# Patient Record
Sex: Male | Born: 1950 | Race: White | Hispanic: No | Marital: Married | Smoking: Former smoker
Health system: Southern US, Community
[De-identification: ages and names within clinical notes are randomized; demographics above are authoritative.]

## PROBLEM LIST (undated history)

## (undated) DIAGNOSIS — R0989 Other specified symptoms and signs involving the circulatory and respiratory systems: Secondary | ICD-10-CM

## (undated) DIAGNOSIS — E0590 Thyrotoxicosis, unspecified without thyrotoxic crisis or storm: Secondary | ICD-10-CM

## (undated) DIAGNOSIS — I10 Essential (primary) hypertension: Secondary | ICD-10-CM

## (undated) DIAGNOSIS — R011 Cardiac murmur, unspecified: Secondary | ICD-10-CM

## (undated) DIAGNOSIS — E669 Obesity, unspecified: Secondary | ICD-10-CM

## (undated) DIAGNOSIS — F419 Anxiety disorder, unspecified: Secondary | ICD-10-CM

## (undated) DIAGNOSIS — E785 Hyperlipidemia, unspecified: Secondary | ICD-10-CM

## (undated) DIAGNOSIS — G4733 Obstructive sleep apnea (adult) (pediatric): Secondary | ICD-10-CM

## (undated) DIAGNOSIS — Z9989 Dependence on other enabling machines and devices: Secondary | ICD-10-CM

## (undated) DIAGNOSIS — I499 Cardiac arrhythmia, unspecified: Secondary | ICD-10-CM

## (undated) DIAGNOSIS — H93239 Hyperacusis, unspecified ear: Secondary | ICD-10-CM

## (undated) DIAGNOSIS — M1712 Unilateral primary osteoarthritis, left knee: Secondary | ICD-10-CM

## (undated) DIAGNOSIS — Z96652 Presence of left artificial knee joint: Secondary | ICD-10-CM

## (undated) DIAGNOSIS — I517 Cardiomegaly: Secondary | ICD-10-CM

## (undated) DIAGNOSIS — E039 Hypothyroidism, unspecified: Secondary | ICD-10-CM

## (undated) DIAGNOSIS — I4819 Other persistent atrial fibrillation: Secondary | ICD-10-CM

## (undated) DIAGNOSIS — E079 Disorder of thyroid, unspecified: Secondary | ICD-10-CM

## (undated) DIAGNOSIS — I4891 Unspecified atrial fibrillation: Secondary | ICD-10-CM

## (undated) HISTORY — DX: Hyperacusis, unspecified ear: H93.239

## (undated) HISTORY — DX: Cardiac murmur, unspecified: R01.1

## (undated) HISTORY — DX: Hyperlipidemia, unspecified: E78.5

## (undated) HISTORY — PX: KNEE SURGERY: SHX244

## (undated) HISTORY — DX: Thyrotoxicosis, unspecified without thyrotoxic crisis or storm: E05.90

## (undated) HISTORY — PX: EYE SURGERY: SHX253

## (undated) HISTORY — DX: Obstructive sleep apnea (adult) (pediatric): G47.33

## (undated) HISTORY — DX: Cardiomegaly: I51.7

## (undated) HISTORY — DX: Dependence on other enabling machines and devices: Z99.89

## (undated) HISTORY — PX: OTHER SURGICAL HISTORY: SHX169

## (undated) HISTORY — PX: APPENDECTOMY: SHX54

---
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
## 1978-02-09 HISTORY — PX: KNEE SURGERY: SHX244

## 2002-01-30 ENCOUNTER — Encounter (INDEPENDENT_AMBULATORY_CARE_PROVIDER_SITE_OTHER): Payer: Self-pay

## 2002-01-30 ENCOUNTER — Ambulatory Visit (HOSPITAL_COMMUNITY): Admission: RE | Admit: 2002-01-30 | Discharge: 2002-01-30 | Payer: Self-pay | Admitting: *Deleted

## 2002-08-18 ENCOUNTER — Encounter: Payer: Self-pay | Admitting: Emergency Medicine

## 2002-08-19 ENCOUNTER — Encounter (INDEPENDENT_AMBULATORY_CARE_PROVIDER_SITE_OTHER): Payer: Self-pay | Admitting: Specialist

## 2002-08-19 ENCOUNTER — Inpatient Hospital Stay (HOSPITAL_COMMUNITY): Admission: EM | Admit: 2002-08-19 | Discharge: 2002-08-20 | Payer: Self-pay | Admitting: Emergency Medicine

## 2005-10-27 ENCOUNTER — Encounter (INDEPENDENT_AMBULATORY_CARE_PROVIDER_SITE_OTHER): Payer: Self-pay | Admitting: *Deleted

## 2005-10-27 ENCOUNTER — Ambulatory Visit: Admission: RE | Admit: 2005-10-27 | Discharge: 2005-10-27 | Payer: Self-pay | Admitting: *Deleted

## 2005-11-16 ENCOUNTER — Encounter: Admission: RE | Admit: 2005-11-16 | Discharge: 2005-11-16 | Payer: Self-pay | Admitting: Endocrinology

## 2006-06-10 ENCOUNTER — Ambulatory Visit (HOSPITAL_BASED_OUTPATIENT_CLINIC_OR_DEPARTMENT_OTHER): Admission: RE | Admit: 2006-06-10 | Discharge: 2006-06-10 | Payer: Self-pay | Admitting: *Deleted

## 2006-06-13 ENCOUNTER — Ambulatory Visit: Payer: Self-pay | Admitting: Internal Medicine

## 2009-02-06 ENCOUNTER — Encounter: Admission: RE | Admit: 2009-02-06 | Discharge: 2009-02-06 | Payer: Self-pay | Admitting: Internal Medicine

## 2010-06-27 NOTE — H&P (Signed)
[name], [name]             ACCOUNT NO.:  [id number]   MEDICAL RECORD NO.:  [id number]          PATIENT TYPE:  AMB   LOCATION:  DFTL                         FACILITY:  [facility]   PHYSICIAN:  Elmore Guise., M.D.DATE OF BIRTH:  04-21-1950   DATE OF ADMISSION:  10/27/2005  DATE OF DISCHARGE:  10/27/2005                                HISTORY & PHYSICAL   INDICATION FOR ADMISSION:  Newly diagnosed atrial fibrillation for TEE/DC  cardioversion.   HISTORY OF PRESENT ILLNESS:  The patient is a very pleasant 60 year old  white male, past medical history of hypertension who presented with  fluttering in his chest for the last week.  He was found to be in atrial  fibrillation with rapid ventricular response and was started on Cardizem and  Coumadin.  The patient actually states that for the last 6 months he has  been having off and on palpitations.  Normally they would last 30 minutes to  an hour at a time and then resolve spontaneously.  Over the last month,  however, his palpitations have been coming on more frequently, now lasting  up to half a day at a time.  They would never be continuous.  Over the last  2 weeks, however, he has been out of rhythm for longer than he has been in  rhythm.  He reports mild dyspnea and decreased exertional tolerance.  Denies  any orthopnea or PND, no recent fever, chills, nausea, vomiting or diarrhea.  He denies any weight loss.  He did lose approximately 40 pounds 2 years ago,  but this was by doing portion control and exercise.  He has no significant  lower extremity edema.  He has had cardiac workup including an exercise  stress Cardiolite done at [facility] on February 24, 2005.  During that visit, he was in sinus rhythm.  He exercised for 7  minutes achieving a peak heart rate of 145 beats per minute corresponding to  88% of age predicted maximum.  He stopped secondary to shortness of breath  but no had no chest  pain.  He had no evidence of inducible ischemia, and he  had normal LV systolic function with an EF of 60%.  He has had a past  echocardiogram which he thinks was done more than 10 years ago.  He thought  he had some leaking in one of his valves.   REVIEW OF SYSTEMS:  Otherwise negative.   CURRENT MEDICATIONS:  1. Diltiazem 180 mg daily.  2. Benicar 20 mg daily.  3. Coumadin 5 mg daily.   ALLERGIES:  PENICILLIN.   FAMILY HISTORY:  Positive for heart disease with his father who had bypass  surgery as well as valve replacement.   SOCIAL HISTORY:  He is widowed, works in Clinical research associate, used to  walk between 2-3 miles at night, however, with his palpitation, has been  unable to do this.  No tobacco intake, does drink decaffeinated tea and 1  Pepsi per day.   PAST SURGICAL HISTORY:  1. Appendectomy in 2005.  2. He had right knee  surgery back in 1976.   PHYSICAL EXAMINATION:  VITAL SIGNS:  His weight is 223 pounds, blood  pressure 130/80.  His heart rate is 138 and irregular.  GENERAL:  He is a very pleasant middle-aged white male, alert and oriented  x4, no acute distress.  He has no JVD and no bruits.  HEENT:  Appear normal.  LUNGS:  Clear.  HEART:  Irregularly irregular with a normal S1, soft flow murmur noted.  ABDOMEN:  Soft, nontender, nondistended.  No rebound or guarding.  EXTREMITIES:  Warm with 2+ pulses and no significant edema.   His EKG did show atrial fibrillation rate of 138 per minute normal axis,  normal intervals with no significant ST or T-wave changes.  His QT interval  was 290 msec, QTC of 439 msec.  His EKG during sinus rhythm also had a  normal QT.  His blood work shows white count of 6.1, hemoglobin of 14.2,  platelet count 263, BUN and creatinine of 17 and 1.0, potassium level 4.6.  LFTs are normal.  He had a total cholesterol of 140, triglycerides of 97,  HDL of 39, LDL of 81.  His TSH is pending at time of dictation.   IMPRESSION:  1. Newly  diagnosed atrial fibrillation.  2. History of hypertension  3. Decreased exertional tolerance and dyspnea.   PLAN:  1. The patient does continue to have atrial fibrillation with rapid      ventricular response.  He is the primary caregiver to his 3 children      since his wife passed away from cancer 2 years ago.  We plan to start      propafenone 150 mg 3 times daily, increase his Diltiazem up to 180 mg      twice daily.  He will continue his Coumadin. He is symptomatic with      decreased exertional tolerance and dyspnea.  I discussed TEE with DC      cardioversion to be scheduled as soon as possible.  We will give his      propafenone over the weekend to see if this does not restore normal      sinus rhythm.  If not, his procedure will be scheduled for Tuesday      September 18, at 1 p.m.  I did discuss risks and benefits of this      procedure at length.  He will have a PT/INR done on arrival should his      INR be subtherapeutic.  I will give 1 shot of Lovenox at that time.  I      plan to see him back 1-2 weeks after his cardioversion.  Should he go      back to normal rhythm, he is to notify the office so we can get an EKG      and instruct him further on his medications.      Elmore Guise., M.D.  Electronically Signed     TWK/MEDQ  D:  10/22/2005  T:  10/22/2005  Job:  [id number]   cc:   Soyla Murphy. Renne Crigler, M.D.

## 2010-06-27 NOTE — Op Note (Signed)
[name], [name]                       ACCOUNT NO.:  [id number]   MEDICAL RECORD NO.:  [id number]                   PATIENT TYPE:  EMS   LOCATION:  ED                                   FACILITY:  [facility]   PHYSICIAN:  Lorne Skeens. Hoxworth, M.D.          DATE OF BIRTH:  11/28/1950   DATE OF PROCEDURE:  08/19/2002  DATE OF DISCHARGE:                                 OPERATIVE REPORT   PREOPERATIVE DIAGNOSIS:  Acute appendicitis.   POSTOPERATIVE DIAGNOSIS:  Acute appendicitis with perforation.   PROCEDURE:  Laparoscopic appendectomy.   SURGEON:  Lorne Skeens. Hoxworth, M.D.   ANESTHESIA:  General.   BRIEF HISTORY:  Mr. Sadlon is a 60 year old white male who presents with  a 12-18 hour history of progressively worsening right lower quadrant  abdominal pain, decreased appetite and fever. He has marked tenderness and  rebound in the right lower quadrant. Laparoscopic appendectomy for apparent  acute appendicitis has been recommended and accepted. The nature of the  procedure, indications, risks of bleeding and infection were discussed and  understood. He is now brought to the operating room for this procedure.   DESCRIPTION OF PROCEDURE:  The patient was brought to the operating room,  placed in the supine position on the operating table and general  endotracheal anesthesia was induced. He had received broad spectrum  antibiotics. A Foley catheter was placed. The abdomen was sterilely prepped  and draped. PAS were in place. Local anesthesia was used to infiltrate the  trocar sites prior to the incisions. A 1 cm incision was made at the  umbilicus and dissection carried down the midline fascia which was sharply  incised for 1 cm and the peritoneum entered under direct vision. Through a  mattress suture of #0 Vicryl, the Hasson trocar was placed and  pneumoperitoneum established. Under direct vision, a 5 mm trocar was placed  in the right upper quadrant and a 12 mm trocar in  the left lower quadrant.  The cecum and appendix were exposed and the appendix was acutely inflamed  with a small perforation at its midpoint. There was not significant diffuse  peritonitis. The appendix was grasped and was free except for the  mesoappendix. This was sequentially divided with the harmonic scalpel up to  the base of the appendix which was completely freed and was not inflamed at  this point. The appendix was divided just below its base at the tip of the  cecum with a single firing of the endoGIA 3.5 mm stapler. The appendix was  removed with an EndoCatch bag. The abdomen was then thoroughly irrigated  with saline until clear and complete hemostasis assured. Trocars removed  under direct vision, all CO2 evacuated from the peritoneal cavity. The  mattress suture was secured at the umbilicus. Skin incisions were closed  with interrupted subcuticular 4-0 Monocryl and Steri-Strips. Sponge, needle  and instrument counts were correct. Dry sterile dressings were applied and  the patient taken to the recovery room in good condition.                                               Lorne Skeens. Hoxworth, M.D.    Tory Emerald  D:  08/19/2002  T:  08/19/2002  Job:  [id number]

## 2010-06-27 NOTE — Op Note (Signed)
   [name], [name]                       ACCOUNT NO.:  [id number]   MEDICAL RECORD NO.:  [id number]                   PATIENT TYPE:  AMB   LOCATION:  ENDO                                 FACILITY:  [facility]   PHYSICIAN:  Georgiana Spinner, M.D.                 DATE OF BIRTH:  12-06-1950   DATE OF PROCEDURE:  01/30/2002  DATE OF DISCHARGE:                                 OPERATIVE REPORT   PROCEDURE:  Upper endoscopy.   INDICATIONS:  GERD.   ANESTHESIA:  Demerol 50, Versed 8 mg.   DESCRIPTION OF PROCEDURE:  With patient mildly sedated in the left lateral  decubitus position, the Olympus videoscopic endoscope was inserted in the  mouth and passed under direct vision through the esophagus, which appeared  normal until we reached distal esophagus, and there was a short segment of  Barrett's, seen and photographed.  We tried to biopsy this, but he refluxed  material from the stomach, so it made it difficult to see this clearly, but  biopsies were taken in the general area.  We advanced into the stomach.  Fundus, body, antrum, duodenal bulb, and second portion of duodenum all  appeared normal.  From this point, the endoscope was slowly withdrawn,  taking circumferential views of the entire duodenal mucosa until the  endoscope pulled back into the stomach, placed in retroflexion to view the  stomach from below.  The endoscope was then straightened and withdrawn,  taking circumferential views of the remaining gastric and esophageal mucosa.  The patient's vital signs and pulse oximeter remained stable.  The patient  tolerated the procedure well without apparent complications.   FINDINGS:  Short segment of Barrett's esophagus above an incomplete wrap of  the gastroesophageal junction around the endoscope with free reflux.   PLAN:  1. Await biopsy report.  2. The patient will call me for results and follow up with me as an     outpatient.  3. Proceed to colonoscopy as planned.                                             Georgiana Spinner, M.D.    GMO/MEDQ  D:  01/30/2002  T:  01/30/2002  Job:  [id number]

## 2010-06-27 NOTE — Op Note (Signed)
   [name], [name]                       ACCOUNT NO.:  [id number]   MEDICAL RECORD NO.:  [id number]                   PATIENT TYPE:  AMB   LOCATION:  ENDO                                 FACILITY:  [facility]   PHYSICIAN:  Georgiana Spinner, M.D.                 DATE OF BIRTH:  Feb 24, 1950   DATE OF PROCEDURE:  DATE OF DISCHARGE:                                 OPERATIVE REPORT   PROCEDURE:  Colonoscopy.   INDICATIONS FOR PROCEDURE:  Rectal bleeding, colon cancer screening.   ANESTHESIA:  Demerol 50 mg.   DESCRIPTION OF PROCEDURE:  With the patient mildly sedated in the left  lateral decubitus position, the Olympus videoscopic colonoscope was inserted  in the rectum and passed under direct vision to the cecum identified by the  ileocecal valve and appendiceal orifice both of which were photographed.  From this point, the colonoscope was slowly withdrawn taking circumferential  views of the entire colonic mucosa stopping in the rectum which appeared  normal in direct and retroflexed view. The endoscope was straightened and  withdrawn. The patient's vital signs and pulse oximeter remained stable. The  patient tolerated the procedure well without apparent complications.   FINDINGS:  Negative examination.   PLAN:  See endoscopy note for further details.                                               Georgiana Spinner, M.D.    GMO/MEDQ  D:  01/30/2002  T:  01/30/2002  Job:  [id number]

## 2010-06-27 NOTE — Procedures (Signed)
[name], [name]             ACCOUNT NO.:  [id number]   MEDICAL RECORD NO.:  [id number]          PATIENT TYPE:  OUT   LOCATION:  SLEEP CENTER                 FACILITY:  [facility]   PHYSICIAN:  Clinton D. Maple Hudson, MD, FCCP, FACPDATE OF BIRTH:  Apr 16, 1950   DATE OF STUDY:  06/10/2006                            NOCTURNAL POLYSOMNOGRAM   REFERRING PHYSICIAN:  TERRY W KERSEY   INDICATION FOR STUDY:  Hypersomnia with sleep apnea.   EPWORTH SLEEPINESS SCORE:  3/24, BMI 31.9, weight 216 pounds.   MEDICATIONS:  Home medications listed and reviewed.   SLEEP ARCHITECTURE:  Short total sleep time 215 minutes, sleep  efficiency 55%.  Stage I was 3%, stage II 79%, stages III and IV 18%,  REM absent.  Sleep latency 32 minutes, awake after sleep onset 44  minutes, arousal index 5.0.  Flecainide was taken at 9:45 p.m.  He  described sleep quality as same as usual, indicating sleep is always  light and interrupted.  He was bothered some by extraneous building  noise.   RESPIRATORY DATA:  Apnea/hypopnea index (AHI, RDI) 11.4 obstructive  events per hour indicating mild obstructive sleep apnea/hypopnea  syndrome.  There were 30 obstructive apneas and 11 hypopneas.  All  events were recorded while sleeping on his back.  REM AHI NA.  There  were insufficient early events to permit CPAP titration by split  protocol on this study night.   OXYGEN DATA:  Mild to moderate snoring with oxygen desaturation to a  nadir of 87%.  Mean oxygen saturation through the study was 97% on room  air.   CARDIAC DATA:  Atrial fibrillation at 68-77 beats per minute.   MOVEMENT-PARASOMNIA:  Occasional limb jerk with little effect on sleep.   IMPRESSIONS-RECOMMENDATIONS:  1. Short total sleep time with absent REM.  He describes sleep quality      as same as usual.  2. Mild obstructive sleep apnea/hypopnea syndrome, AHI 11.4 per hour      with most events recorded while sleeping on his back.  Mild to      moderate  snoring with oxygen desaturation to a nadir of 87%.  3. Scores in this range may be considered for a trial of CPAP therapy      if symptoms warrant and more      conservative therapies are unsuccessful.  4. Atrial fibrillation with controlled ventricular response rate.      Clinton D. Maple Hudson, MD, FCCP, FACP  Diplomate, Biomedical engineer of Sleep Medicine  Electronically Signed     CDY/MEDQ  D:  06/13/2006 11:47:04  T:  06/13/2006 17:53:41  Job:  [id number]

## 2011-03-03 ENCOUNTER — Emergency Department (INDEPENDENT_AMBULATORY_CARE_PROVIDER_SITE_OTHER)
Admission: EM | Admit: 2011-03-03 | Discharge: 2011-03-03 | Disposition: A | Discharge status: Home / Self Care | Payer: Federal, State, Local not specified - PPO | Source: Home / Self Care

## 2011-03-03 ENCOUNTER — Encounter: Payer: Self-pay | Admitting: *Deleted

## 2011-03-03 DIAGNOSIS — Z23 Encounter for immunization: Secondary | ICD-10-CM

## 2011-03-03 HISTORY — DX: Essential (primary) hypertension: I10

## 2011-03-03 MED ORDER — INFLUENZA VAC TYP A&B SURF ANT IM INJ
0.5000 mL | INJECTION | Freq: Once | INTRAMUSCULAR | Status: AC
  Administered 2011-03-03: 0.5 mL via INTRAMUSCULAR

## 2011-03-03 NOTE — ED Notes (Signed)
The pt is here today for a flu vaccine.  

## 2011-12-10 ENCOUNTER — Ambulatory Visit (INDEPENDENT_AMBULATORY_CARE_PROVIDER_SITE_OTHER): Payer: Federal, State, Local not specified - PPO | Admitting: Family Medicine

## 2011-12-10 VITALS — BP 141/85 | HR 65 | Temp 98.6°F | Resp 18 | Ht 69.0 in | Wt 251.2 lb

## 2011-12-10 DIAGNOSIS — J40 Bronchitis, not specified as acute or chronic: Secondary | ICD-10-CM

## 2011-12-10 MED ORDER — BENZONATATE 100 MG PO CAPS: 200.0000 mg | ORAL_CAPSULE | Freq: Three times a day (TID) | ORAL | Status: DC | PRN

## 2011-12-10 MED ORDER — AZITHROMYCIN 250 MG PO TABS: ORAL_TABLET | ORAL | Status: DC

## 2011-12-10 NOTE — Patient Instructions (Addendum)

## 2011-12-10 NOTE — Progress Notes (Signed)
  Subjective:    Patient ID: [name], male    DOB: 1950-05-09, 61 y.o.   MRN: [id number]  HPI fever about 102 1 wk prev treating w/ coracidin cold and flu but breathing is getting noisier and more central w/ worsening lung congestion. Coughing up a little clear/white phlegm.  Feels like he was initially recovering but now getting much worse again.  Uses cpap every night  Review of Systems  Constitutional: Positive for fever, chills, diaphoresis, activity change and fatigue. Negative for appetite change and unexpected weight change.  HENT: Positive for sore throat and sinus pressure. Negative for ear pain, congestion, rhinorrhea, neck pain and neck stiffness.   Respiratory: Positive for cough. Negative for shortness of breath.   Cardiovascular: Negative for chest pain.  Gastrointestinal: Positive for nausea. Negative for vomiting, abdominal pain, diarrhea and constipation.  Genitourinary: Negative for dysuria.  Musculoskeletal: Negative for myalgias, arthralgias and gait problem.  Skin: Negative for rash.  Neurological: Positive for headaches. Negative for syncope.  Hematological: Positive for adenopathy.  Psychiatric/Behavioral: Negative for disturbed wake/sleep cycle.      BP 141/85  Pulse 65  Temp 98.6 F (37 C) (Oral)  Resp 18  Ht 5\' 9"  (1.753 m)  Wt 251 lb 3.2 oz (113.944 kg)  BMI 37.10 kg/m2  SpO2 96% Objective:   Physical Exam  Constitutional: He is oriented to person, place, and time. He appears well-developed and well-nourished. No distress.  HENT:  Head: Normocephalic and atraumatic.  Right Ear: Tympanic membrane, external ear and ear canal normal.  Left Ear: Tympanic membrane, external ear and ear canal normal.  Nose: Nose normal.  Mouth/Throat: Oropharynx is clear and moist and mucous membranes are normal. No oropharyngeal exudate.  Eyes: Conjunctivae normal are normal. No scleral icterus.  Neck: Normal range of motion. Neck supple. No thyromegaly present.    Cardiovascular: Normal rate, regular rhythm, normal heart sounds and intact distal pulses.   Pulmonary/Chest: Effort normal and breath sounds normal. No respiratory distress.       Significant upper airways sounds w/ prolonged exhalation.  Musculoskeletal: He exhibits no edema.  Lymphadenopathy:    He has no cervical adenopathy.  Neurological: He is alert and oriented to person, place, and time.  Skin: Skin is warm and dry. He is not diaphoretic. No erythema.  Psychiatric: He has a normal mood and affect. His behavior is normal.      Assessment & Plan:   1. Bronchitis - will treat w/ zpack due to secondary worsening. Prn tessalon for cough - has HTN so avoid otc cough/cold meds.

## 2012-02-08 ENCOUNTER — Other Ambulatory Visit: Payer: Self-pay | Admitting: Gastroenterology

## 2012-04-09 ENCOUNTER — Ambulatory Visit (INDEPENDENT_AMBULATORY_CARE_PROVIDER_SITE_OTHER): Payer: Federal, State, Local not specified - PPO | Admitting: Family Medicine

## 2012-04-09 ENCOUNTER — Ambulatory Visit: Payer: Federal, State, Local not specified - PPO

## 2012-04-09 VITALS — BP 131/86 | HR 62 | Temp 99.3°F | Resp 18 | Ht 69.0 in | Wt 247.0 lb

## 2012-04-09 DIAGNOSIS — R05 Cough: Secondary | ICD-10-CM

## 2012-04-09 DIAGNOSIS — R059 Cough, unspecified: Secondary | ICD-10-CM

## 2012-04-09 DIAGNOSIS — J189 Pneumonia, unspecified organism: Secondary | ICD-10-CM

## 2012-04-09 MED ORDER — LEVOFLOXACIN 500 MG PO TABS: 500.0000 mg | ORAL_TABLET | Freq: Every day | ORAL | Status: DC

## 2012-04-09 MED ORDER — IPRATROPIUM-ALBUTEROL 20-100 MCG/ACT IN AERS: 1.0000 | INHALATION_SPRAY | Freq: Four times a day (QID) | RESPIRATORY_TRACT | Status: DC

## 2012-04-09 NOTE — Patient Instructions (Addendum)

## 2012-04-09 NOTE — Progress Notes (Signed)
[facility]:  Office Visit  Chief Complaint:  Chief Complaint  Patient presents with  . Otalgia    left earache  . Sinus Problem    pressure  . Cough    HPI: [name] is a 62 y.o. male who complains of  Left ear pain, sinus congestion, pressure and cough, fever x 1 week ago, SOB. Did get flu vaccine. Denies msk aches or pains. + Barky, clear productive cough. Was feeling better but now feeling worse.   Past Medical History  Diagnosis Date  . Hypertension   . Heart murmur    Past Surgical History  Procedure Laterality Date  . Knee surgery    . Appendectomy     History   Social History  . Marital Status: Widowed    Spouse Name: N/A    Number of Children: N/A  . Years of Education: N/A   Social History Main Topics  . Smoking status: Former Games developer  . Smokeless tobacco: Not on file  . Alcohol Use: No  . Drug Use: No  . Sexually Active: Yes   Other Topics Concern  . Not on file   Social History Narrative  . No narrative on file   Family History  Problem Relation Age of Onset  . Heart failure Father   . Heart disease Father   . Stroke Maternal Grandfather   . Heart disease Paternal Grandfather    Allergies  Allergen Reactions  . Penicillins    Prior to Admission medications   Medication Sig Start Date End Date Taking? Authorizing Provider  levothyroxine (SYNTHROID, LEVOTHROID) 100 MCG tablet Take 100 mcg by mouth daily.   Yes Historical Provider, MD  olmesartan (BENICAR) 20 MG tablet Take 20 mg by mouth daily.   Yes Historical Provider, MD  azithromycin (ZITHROMAX) 250 MG tablet Take 2 tabs PO x 1 dose, then 1 tab PO QD x 4 days 12/10/11   Sherren Mocha, MD  benzonatate (TESSALON PERLES) 100 MG capsule Take 2 capsules (200 mg total) by mouth 3 (three) times daily as needed for cough. 12/10/11   Sherren Mocha, MD     ROS: The patient denies  night sweats, unintentional weight loss, chest pain, palpitations,  nausea, vomiting, abdominal  pain, dysuria, hematuria, melena, numbness, weakness, or tingling.  All other systems have been reviewed and were otherwise negative with the exception of those mentioned in the HPI and as above.    PHYSICAL EXAM: Filed Vitals:   04/09/12 1706  BP: 131/86  Pulse: 62  Temp: 99.3 F (37.4 C)  Resp: 18   Filed Vitals:   04/09/12 1706  Height: 5\' 9"  (1.753 m)  Weight: 247 lb (112.038 kg)   Body mass index is 36.46 kg/(m^2).  General: Alert, no acute distress HEENT:  Normocephalic, atraumatic, oropharynx patent. TM nl, no exudates. + sinus tenderness Cardiovascular:  Regular rate and rhythm, no rubs murmurs or gallops.  No Carotid bruits, radial pulse intact. No pedal edema.  Respiratory: Clear to auscultation bilaterally.  + wheezes, No rales, + coarse BS, rhonchi.  No cyanosis, no use of accessory musculature GI: No organomegaly, abdomen is soft and non-tender, positive bowel sounds.  No masses. Skin: No rashes. Neurologic: Facial musculature symmetric. Psychiatric: Patient is appropriate throughout our interaction. Lymphatic: No cervical lymphadenopathy Musculoskeletal: Gait intact.   LABS: No results found for this or any previous visit.   EKG/XRAY:   Primary read interpreted by Dr. Conley Rolls at [facility]. + infiltrate  Right middle/lower lobe, + PNA, no effusion, pneumothroax  ASSESSMENT/PLAN: Encounter Diagnoses  Name Primary?  . Cough Yes  . Pneumonia    Rx Levaquin, Tessalon Perles, Combivent F/u in 3-4 weeks after abx completion F/u prn    LE, THAO PHUONG, DO 04/09/2012 6:19 PM

## 2012-04-13 ENCOUNTER — Telehealth: Payer: Self-pay | Admitting: Family Medicine

## 2012-04-13 NOTE — Telephone Encounter (Signed)
LM to check how he is doing on abx, also to let him know official radiology report .There is something in his RLL which we both agree on . Advise, he needs to come back in 3-4 weeks after abx for repeat xray.

## 2012-05-02 ENCOUNTER — Telehealth: Payer: Self-pay

## 2012-05-02 NOTE — Telephone Encounter (Signed)
Request given to xray 

## 2012-05-02 NOTE — Telephone Encounter (Signed)
Pt requesting a copy of  x-ray  For OV tomorrow morning   CBN:  [id number]

## 2012-09-05 ENCOUNTER — Encounter: Payer: Self-pay | Admitting: *Deleted

## 2013-04-05 ENCOUNTER — Ambulatory Visit (INDEPENDENT_AMBULATORY_CARE_PROVIDER_SITE_OTHER): Payer: Federal, State, Local not specified - PPO | Admitting: Physician Assistant

## 2013-04-05 VITALS — BP 142/80 | HR 60 | Temp 98.4°F | Ht 69.5 in | Wt 246.2 lb

## 2013-04-05 DIAGNOSIS — L0201 Cutaneous abscess of face: Secondary | ICD-10-CM

## 2013-04-05 DIAGNOSIS — L03211 Cellulitis of face: Secondary | ICD-10-CM

## 2013-04-05 MED ORDER — DOXYCYCLINE HYCLATE 100 MG PO CAPS
100.0000 mg | ORAL_CAPSULE | Freq: Two times a day (BID) | ORAL | Status: DC
Start: 2013-04-05 — End: 2013-05-22

## 2013-04-05 NOTE — Progress Notes (Signed)
   Subjective:    Patient ID: [name], male    DOB: 1950-12-11, 63 y.o.   MRN: [id number]  HPI 63 year old male presents for evaluation of knot on his forehead x 2 days.  States symptoms started as a small red bump and seem to have progressively worsened.  The skin around the bump has become red now as well. He is concerned because it keeps getting bigger and also is getting married in 3 days.  No hx of skin infections or MRSA.  Has not noticed any drainage from the area but has been quite tempted to squeeze it.   Denies fever, chills, nausea, vomiting, or headache.  He is otherwise doing well with no other concerns today.     Review of Systems  Constitutional: Negative for fever and chills.  Gastrointestinal: Negative for nausea and vomiting.  Skin: Positive for color change.  Neurological: Negative for headaches.       Objective:   Physical Exam  Constitutional: He is oriented to person, place, and time. He appears well-developed and well-nourished.  HENT:  Head: Normocephalic and atraumatic.  Right Ear: External ear normal.  Eyes: Conjunctivae are normal.  Neck: Normal range of motion.  Cardiovascular: Normal rate.   Pulmonary/Chest: Effort normal.  Neurological: He is alert and oriented to person, place, and time.  Skin:     Noted area has 1 cm erythematous papule with central pustule. +surrounding erythema. No vesicles or blisters.    Psychiatric: He has a normal mood and affect. His behavior is normal. Judgment and thought content normal.      22G needle used to puncture central pustule. Moderate amount of purulence expressed. Culture collected. Patient tolerated well.     Assessment & Plan:  Cellulitis and abscess of face - Plan: doxycycline (VIBRAMYCIN) 100 MG capsule, Wound culture  Culture pending Start doxycycline 100 mg bid x 10 days Warm compresses today.  Follow up if symptoms worsen or fail to improve.

## 2013-04-07 LAB — WOUND CULTURE
Gram Stain: NONE SEEN
Gram Stain: NONE SEEN
Gram Stain: NONE SEEN
Organism ID, Bacteria: NO GROWTH

## 2013-05-22 ENCOUNTER — Ambulatory Visit (INDEPENDENT_AMBULATORY_CARE_PROVIDER_SITE_OTHER): Payer: Federal, State, Local not specified - PPO | Admitting: Physician Assistant

## 2013-05-22 VITALS — BP 130/82 | HR 62 | Temp 98.2°F | Resp 16 | Ht 68.5 in | Wt 252.0 lb

## 2013-05-22 DIAGNOSIS — H6690 Otitis media, unspecified, unspecified ear: Secondary | ICD-10-CM

## 2013-05-22 DIAGNOSIS — H60399 Other infective otitis externa, unspecified ear: Secondary | ICD-10-CM

## 2013-05-22 MED ORDER — CLARITHROMYCIN 500 MG PO TABS: 500.0000 mg | ORAL_TABLET | Freq: Two times a day (BID) | ORAL | Status: DC

## 2013-05-22 MED ORDER — CIPROFLOXACIN-DEXAMETHASONE 0.3-0.1 % OT SUSP: 4.0000 [drp] | Freq: Two times a day (BID) | OTIC | Status: DC

## 2013-05-22 NOTE — Patient Instructions (Signed)
The antibiotic prescribed today is for your present infection only. It is very important to follow the directions for the medication prescribed. Antibiotics are generally given for a specified period of time (7-10 days, for example) to be taken at specific intervals (every 4, 6, 8 or 12 hours). This is necessary to keep the right amount of the medication in the bloodstream. Too much of the medication may cause an adverse reaction, too little may not be completely effective.  To clear your infection completely, continue taking the antibiotic for the full time of treatment, even if you begin to feel better after a few days.  If you miss a dose of the antibiotic, take it as soon as possible. Then go back to your regular dosing schedule. However, don't double up doses.    Begin using the Ciprodex drops twice daily as directed  Take the clarythromycin twice daily as directed  Use Tylenol and/or Advil if needed for pain relief  If any symptoms are worsening or not improving, please let us know   Otitis Externa Otitis externa is a bacterial or fungal infection of the outer ear canal. This is the area from the eardrum to the outside of the ear. Otitis externa is sometimes called "swimmer's ear." CAUSES  Possible causes of infection include:  Swimming in dirty water.  Moisture remaining in the ear after swimming or bathing.  Mild injury (trauma) to the ear.  Objects stuck in the ear (foreign body).  Cuts or scrapes (abrasions) on the outside of the ear. SYMPTOMS  The first symptom of infection is often itching in the ear canal. Later signs and symptoms may include swelling and redness of the ear canal, ear pain, and yellowish-white fluid (pus) coming from the ear. The ear pain may be worse when pulling on the earlobe. DIAGNOSIS  Your caregiver will perform a physical exam. A sample of fluid may be taken from the ear and examined for bacteria or fungi. TREATMENT  Antibiotic ear drops are often  given for 10 to 14 days. Treatment may also include pain medicine or corticosteroids to reduce itching and swelling. PREVENTION   Keep your ear dry. Use the corner of a towel to absorb water out of the ear canal after swimming or bathing.  Avoid scratching or putting objects inside your ear. This can damage the ear canal or remove the protective wax that lines the canal. This makes it easier for bacteria and fungi to grow.  Avoid swimming in lakes, polluted water, or poorly chlorinated pools.  You may use ear drops made of rubbing alcohol and vinegar after swimming. Combine equal parts of white vinegar and alcohol in a bottle. Put 3 or 4 drops into each ear after swimming. HOME CARE INSTRUCTIONS   Apply antibiotic ear drops to the ear canal as prescribed by your caregiver.  Only take over-the-counter or prescription medicines for pain, discomfort, or fever as directed by your caregiver.  If you have diabetes, follow any additional treatment instructions from your caregiver.  Keep all follow-up appointments as directed by your caregiver. SEEK MEDICAL CARE IF:   You have a fever.  Your ear is still red, swollen, painful, or draining pus after 3 days.  Your redness, swelling, or pain gets worse.  You have a severe headache.  You have redness, swelling, pain, or tenderness in the area behind your ear. MAKE SURE YOU:   Understand these instructions.  Will watch your condition.  Will get help right away if you are  not doing well or get worse. Document Released: 01/26/2005 Document Revised: 04/20/2011 Document Reviewed: 02/12/2011 [facility] Patient Information 2014 [employer].    Otitis Media, Adult Otitis media is redness, soreness, and swelling (inflammation) of the middle ear. Otitis media may be caused by allergies or, most commonly, by infection. Often it occurs as a complication of the common cold. SIGNS AND SYMPTOMS Symptoms of otitis media may  include:  Earache.  Fever.  Ringing in your ear.  Headache.  Leakage of fluid from the ear. DIAGNOSIS To diagnose otitis media, your health care provider will examine your ear with an otoscope. This is an instrument that allows your health care provider to see into your ear in order to examine your eardrum. Your health care provider also will ask you questions about your symptoms. TREATMENT  Typically, otitis media resolves on its own within 3 5 days. Your health care provider may prescribe medicine to ease your symptoms of pain. If otitis media does not resolve within 5 days or is recurrent, your health care provider may prescribe antibiotic medicines if he or she suspects that a bacterial infection is the cause. HOME CARE INSTRUCTIONS   Take your medicine as directed until it is gone, even if you feel better after the first few days.  Only take over-the-counter or prescription medicines for pain, discomfort, or fever as directed by your health care provider.  Follow up with your health care provider as directed. SEEK MEDICAL CARE IF:  You have otitis media only in one ear or bleeding from your nose or both.  You notice a lump on your neck.  You are not getting better in 3 5 days.  You feel worse instead of better. SEEK IMMEDIATE MEDICAL CARE IF:   You have pain that is not controlled with medicine.  You have swelling, redness, or pain around your ear or stiffness in your neck.  You notice that part of your face is paralyzed.  You notice that the bone behind your ear (mastoid) is tender when you touch it. MAKE SURE YOU:   Understand these instructions.  Will watch your condition.  Will get help right away if you are not doing well or get worse. Document Released: 11/01/2003 Document Revised: 11/16/2012 Document Reviewed: 08/23/2012 [facility] Patient Information 2014 [place], [place].

## 2013-05-22 NOTE — Progress Notes (Signed)
   Subjective:    Patient ID: [name], male    DOB: [birth year], 62 y.o.   MRN: [id number]  HPI   [name] is a very pleasant 63 yr old male with concern for illness.  He reports that his Left ear has been painful for the last 3-4 days.  The canal is swollen.  The ear is tender to the touch.  Hearing is decreased on that side.  Feels pressure in the middle ear, like there may be fluid.  He did have some clear discharge from the Left ear.  Has had some sinus trouble due to pollen.  +post nasal, scratchy throat.  Wears ear devices due to tinnitus bilaterally.  No fever.  Has not taken any medication for ear pain.   Review of Systems  Constitutional: Negative for fever and chills.  HENT: Positive for congestion, ear discharge, ear pain, postnasal drip and tinnitus.   Respiratory: Negative.   Cardiovascular: Negative.   Gastrointestinal: Negative.   Musculoskeletal: Negative.   Skin: Negative.        Objective:   Physical Exam  Vitals reviewed. Constitutional: He is oriented to person, place, and time. He appears well-developed and well-nourished.  HENT:  Head: Normocephalic and atraumatic.  Right Ear: Tympanic membrane and ear canal normal.  Left Ear: There is swelling (EAC) and tenderness (w/ manipulation of external ear and tragus). No drainage.  Left EAC swollen with a small amount of white debris in the posterior canal; unable to completely visualize the TM  Neck: Neck supple.  Pulmonary/Chest: Effort normal.  Lymphadenopathy:    He has no cervical adenopathy.  Neurological: He is alert and oriented to person, place, and time.  Skin: Skin is warm and dry.  Psychiatric: He has a normal mood and affect. His behavior is normal.       Assessment & Plan:  Otitis, externa, infective - Plan: ciprofloxacin-dexamethasone (CIPRODEX) otic suspension  Otitis media - Plan: clarithromycin (BIAXIN) 500 MG tablet   [name] is a pleasant 63 yr old male here with otitis  externa and possibly otitis media as well - though I cannot fully visualize the TM.  Will treat with Ciprodex and Biaxin given pt's possible anaphylaxis to pcn in the past.  Tylenol and/or Advil for pain relief.  Encouraged him to not wear his left ear tinnitus device until infection has resolved  Pt to call or RTC if worsening or not improving  E. Natividad Brood MHS, PA-C [facility] 4/13/20154:38 PM

## 2013-06-08 ENCOUNTER — Ambulatory Visit (INDEPENDENT_AMBULATORY_CARE_PROVIDER_SITE_OTHER): Payer: Federal, State, Local not specified - PPO | Admitting: Cardiology

## 2013-06-08 ENCOUNTER — Ambulatory Visit: Payer: Federal, State, Local not specified - PPO | Admitting: Cardiology

## 2013-06-08 VITALS — BP 140/82 | HR 71 | Ht 69.0 in | Wt 251.0 lb

## 2013-06-08 DIAGNOSIS — E785 Hyperlipidemia, unspecified: Secondary | ICD-10-CM

## 2013-06-08 DIAGNOSIS — E669 Obesity, unspecified: Secondary | ICD-10-CM

## 2013-06-08 DIAGNOSIS — R079 Chest pain, unspecified: Secondary | ICD-10-CM

## 2013-06-08 DIAGNOSIS — I10 Essential (primary) hypertension: Secondary | ICD-10-CM

## 2013-06-08 NOTE — Patient Instructions (Signed)
Your physician recommends that you schedule a follow-up appointment in:  After your stress test with Dr. Gwenlyn Found

## 2013-06-16 ENCOUNTER — Ambulatory Visit (HOSPITAL_COMMUNITY)
Admission: RE | Admit: 2013-06-16 | Discharge: 2013-06-16 | Disposition: A | Discharge status: Home / Self Care | Payer: Federal, State, Local not specified - PPO | Source: Ambulatory Visit | Attending: Internal Medicine | Admitting: Internal Medicine

## 2013-06-16 DIAGNOSIS — I4891 Unspecified atrial fibrillation: Secondary | ICD-10-CM | POA: Insufficient documentation

## 2013-06-16 DIAGNOSIS — R079 Chest pain, unspecified: Secondary | ICD-10-CM

## 2013-06-16 MED ORDER — REGADENOSON 0.4 MG/5ML IV SOLN: 0.4000 mg | Freq: Once | INTRAVENOUS | Status: DC

## 2013-06-16 MED ORDER — TECHNETIUM TC 99M SESTAMIBI GENERIC - CARDIOLITE
10.8000 | Freq: Once | INTRAVENOUS | Status: AC | PRN
  Administered 2013-06-16: 11 via INTRAVENOUS

## 2013-06-16 MED ORDER — TECHNETIUM TC 99M SESTAMIBI GENERIC - CARDIOLITE
29.9000 | Freq: Once | INTRAVENOUS | Status: AC | PRN
  Administered 2013-06-16: 29.9 via INTRAVENOUS

## 2013-06-16 NOTE — Progress Notes (Signed)
Patient ID: [name], male   DOB: 01-14-1951, 63 y.o.   MRN: [id number]    06/08/2013 [name]   15-Mar-1950  [id number]  Primary Physicia [name], MD Primary Cardiologist: Dr. Gwenlyn Found    HPI:  The patient is a 63 y/o moderately obese WM, followed by Dr. Gwenlyn Found in the past. He has not been seen since 2007. He has a PMH significant for HTN, HLD and Afib. He also notes a significant family history of CAD. His paternal grandfather had CABG in his 58s and his father had an MI in his 15s.  On February 24, 2005, he underwent an exercise NST that was low risk for ischemia. LV systolic function, at that time was normal, with an estimated EF of 60%. In September 2007, he was evaluated at [facility] for palpitations W/u revealed new onset Afib w/ RVR. He was intially placed on Cardizem and Coumadin and underwent successful TEE/DCCV. He denies any further recurrence of palpitations since that time and is now off of coumadin and Cardizem.   He was referred back to our clinic by his PCP, Dr. Shelia Media, for evaluation of chest pain. His symptoms have been occuring intermittently for the last 2 months. His pain is substernal and pressure-like. It does not radiate. He admits to not being physically active, but  does not seem to have any symptoms with light to moderate exertional activities (walking up hills or stairs). His main trigger seems to be emotional stress. He states that he has been very stressed at work lately and has had added work load and has been delegated additional responsibitlires. It is often during work when his chest pain occurs. One episode in particular was associated with slight diaphoresis and nausea. He denies vomiting, dizziness, palpations, syncope or near syncope. He did not take anything for his symptoms. His symptoms resolved once he left work. It was this episode that prompted him to see Dr. Shelia Media. Today in clinic, he is currently CP free.  Current Outpatient Prescriptions    Medication Sig Dispense Refill  . co-enzyme Q-10 30 MG capsule Take 30 mg by mouth 3 (three) times daily.      Marland Kitchen levothyroxine (SYNTHROID, LEVOTHROID) 100 MCG tablet Take 100 mcg by mouth daily.      Marland Kitchen olmesartan (BENICAR) 20 MG tablet Take 20 mg by mouth daily.      Marland Kitchen omega-3 acid ethyl esters (LOVAZA) 1 G capsule Take 1 g by mouth 2 (two) times daily.       No current facility-administered medications for this visit.    Allergies  Allergen Reactions  . Penicillins Anaphylaxis    Pt reports throat swelling, difficulty breathing at age 64    History   Social History  . Marital Status: Widowed    Spouse Name: N/A    Number of Children: N/A  . Years of Education: N/A   Occupational History  . Not on file.   Social History Main Topics  . Smoking status: Former Research scientist (life sciences)  . Smokeless tobacco: Not on file  . Alcohol Use: No  . Drug Use: No  . Sexual Activity: Yes   Other Topics Concern  . Not on file   Social History Narrative  . No narrative on file     Review of Systems: General: negative for chills, fever, night sweats or weight changes.  Cardiovascular: negative for chest pain, dyspnea on exertion, edema, orthopnea, palpitations, paroxysmal nocturnal dyspnea or shortness of breath Dermatological: negative for rash Respiratory: negative  for cough or wheezing Urologic: negative for hematuria Abdominal: negative for nausea, vomiting, diarrhea, bright red blood per rectum, melena, or hematemesis Neurologic: negative for visual changes, syncope, or dizziness All other systems reviewed and are otherwise negative except as noted above.    Blood pressure 140/82, pulse 71, height 5\' 9"  (1.753 m), weight 251 lb (113.853 kg).  General appearance: alert, cooperative and no distress Neck: no carotid bruit and no JVD Lungs: clear to auscultation bilaterally Heart: regular rate and rhythm, S1, S2 normal, no murmur, click, rub or gallop Extremities: no LEE Pulses: 2+ and  symmetric Skin: warm and dry Neurologic: Grossly normal  EKG sinus rhythm with PACs.  ASSESSMENT AND PLAN:   Chest pain with moderate risk for cardiac etiology He has multiple risk factors, including HTN, HLD, obesity and family h/o CAD. His chest pain symptoms could possibly be angina, but may also be related to panic attacks, as this seems to be triggered by emotional stress, related to his job. His EKG demonstrates NSR w/o any signs of ischemia. I think the best plan is to risk stratify with a NST. We have discussed this and he aggress to an exercise nuc study.   HTN (hypertension) Followed by his PCP. 140/82 today. Continue with Benicar.   HLD (hyperlipidemia) Followed by PCP. He is on Lovaza.  Obesity Discussed the importance of weight loss. I encouraged light to moderate exercise, (particuarly walking) at least 3 days a week for 30 minutes.     PLAN  Exercise nuclear stress test to rule out ischemia. If abnormal, will plan for diagnostic LHC. If normal, I encouraged him to resume at least yearly  follow-ups with Dr. Gwenlyn Found, given his multiple cardiac risk factors.   Brittainy SimmonsPA-C 06/19/2013 1:38 PM

## 2013-06-16 NOTE — Procedures (Addendum)
[place] [address] [place] [address] [phone]  Cardiology Nuclear Med Study  [name] is a 63 y.o. male     MRN : [id number]     DOB: 1950-03-28  Procedure Date: 06/16/2013  Nuclear Med Background Indication for Stress Test:  Evaluation for Ischemia History:  Hx of A-Fib;Last NUC MPI on 07/23/2010-nonischemic;EF=59%;ECHO=10/27/2005 Cardiac Risk Factors: Family History - CAD, History of Smoking, Hypertension and Obesity  Symptoms:  Chest Pain and L arm pain   Nuclear Pre-Procedure Caffeine/Decaff Intake:  9:00pm NPO After: 7:00am   IV Site: R Forearm  IV 0.9% NS with Angio Cath:  22g  Chest Size (in):  50"  IV Started by: Rolene Course, RN  Height: 5\' 9"  (1.753 m)  Cup Size: n/a  BMI:  Body mass index is 37.05 kg/(m^2). Weight:  251 lb (113.853 kg)   Tech Comments:  n/a    Nuclear Med Study 1 or 2 day study: 1 day  Stress Test Type:  [place] Provider:  Quay Burow, MD   Resting Radionuclide: Technetium 49m Sestamibi  Resting Radionuclide Dose: 10.8 mCi   Stress Radionuclide:  Technetium 51m Sestamibi  Stress Radionuclide Dose: 29.9 mCi           Stress Protocol Rest HR: 53 Stress HR: 142  Rest BP: 126/63 Stress BP: 210/126  Exercise Time (min): 9:00 METS: 10.10   Predicted Max HR: 157 bpm % Max HR: 90.45 bpm Rate Pressure Product: 29820  Dose of Adenosine (mg):  n/a Dose of Lexiscan: n/a mg  Dose of Atropine (mg): n/a Dose of Dobutamine: n/a mcg/kg/min (at max HR)  Stress Test Technologist: Mellody Memos, CCT Nuclear Technologist: Imagene Riches, CNMT   Rest Procedure:  Myocardial perfusion imaging was performed at rest 45 minutes following the intravenous administration of Technetium 47m Sestamibi. Stress Procedure:  The patient performed treadmill exercise using a Bruce  Protocol for 9 minutes. The patient stopped due to generalized fatigue and shortness of breath.  Patient denied any chest pain.  There were no significant ST-T wave changes.  Technetium 23m Sestamibi was injected IV at peak exercise and myocardial perfusion imaging was performed after a brief delay.  Transient Ischemic Dilatation (Normal <1.22):  0.88 Lung/Heart Ratio (Normal <0.45):  0.34 QGS EDV:  110 ml QGS ESV:  44 ml LV Ejection Fraction: 60%        Rest ECG: Sinus Bradycardia at 53  Stress ECG: No significant change from baseline ECG  QPS Raw Data Images:  Normal; no motion artifact; normal heart/lung ratio. Stress Images:  Normal homogeneous uptake in all areas of the myocardium. Rest Images:  Normal homogeneous uptake in all areas of the myocardium. Subtraction (SDS):  Normal  Impression Exercise Capacity:  Good exercise capacity. BP Response:  Hypertensive response with peak BP 210/126 Clinical Symptoms:  No significant symptoms noted. ECG Impression:  No significant ST segment change suggestive of ischemia. Comparison with Prior Nuclear Study: No significant change from previous study  Overall Impression:  Low risk stress nuclear study demonstrating normal scintigraphic images with an exagerrated BP response.  LV Wall Motion:  NL LV Function, 60%; NL Wall Motion   Troy Sine, MD  06/16/2013 12:14 PM

## 2013-06-19 ENCOUNTER — Encounter: Payer: Self-pay | Admitting: *Deleted

## 2013-06-19 ENCOUNTER — Encounter: Payer: Self-pay | Admitting: Cardiology

## 2013-06-19 DIAGNOSIS — I10 Essential (primary) hypertension: Secondary | ICD-10-CM | POA: Insufficient documentation

## 2013-06-19 DIAGNOSIS — E669 Obesity, unspecified: Secondary | ICD-10-CM | POA: Insufficient documentation

## 2013-06-19 DIAGNOSIS — R079 Chest pain, unspecified: Secondary | ICD-10-CM | POA: Insufficient documentation

## 2013-06-19 DIAGNOSIS — E785 Hyperlipidemia, unspecified: Secondary | ICD-10-CM | POA: Insufficient documentation

## 2013-06-19 HISTORY — DX: Obesity, unspecified: E66.9

## 2013-06-19 NOTE — Assessment & Plan Note (Signed)
Followed by PCP. He is on Lovaza.

## 2013-06-19 NOTE — Assessment & Plan Note (Signed)
Followed by his PCP. 140/82 today. Continue with Benicar.

## 2013-06-19 NOTE — Assessment & Plan Note (Signed)
Discussed the importance of weight loss. I encouraged light to moderate exercise, (particuarly walking) at least 3 days a week for 30 minutes.

## 2013-06-19 NOTE — Assessment & Plan Note (Signed)
He has multiple risk factors, including HTN, HLD, obesity and family h/o CAD. His chest pain symptoms could possibly be angina, but may also be related to panic attacks, as this seems to be triggered by emotional stress, related to his job. His EKG demonstrates NSR w/o any signs of ischemia. I think the best plan is to risk stratify with a NST. We have discussed this and he aggress to an exercise nuc study.

## 2013-09-05 ENCOUNTER — Encounter (HOSPITAL_COMMUNITY): Payer: Self-pay | Admitting: Emergency Medicine

## 2013-09-05 ENCOUNTER — Emergency Department (HOSPITAL_COMMUNITY)
Admission: EM | Admit: 2013-09-05 | Discharge: 2013-09-05 | Disposition: A | Discharge status: Home / Self Care | Payer: Federal, State, Local not specified - PPO | Attending: Emergency Medicine | Admitting: Emergency Medicine

## 2013-09-05 ENCOUNTER — Emergency Department (HOSPITAL_COMMUNITY): Payer: Federal, State, Local not specified - PPO

## 2013-09-05 DIAGNOSIS — Z87891 Personal history of nicotine dependence: Secondary | ICD-10-CM | POA: Insufficient documentation

## 2013-09-05 DIAGNOSIS — R0602 Shortness of breath: Secondary | ICD-10-CM | POA: Diagnosis present

## 2013-09-05 DIAGNOSIS — R0989 Other specified symptoms and signs involving the circulatory and respiratory systems: Secondary | ICD-10-CM | POA: Diagnosis not present

## 2013-09-05 DIAGNOSIS — R0609 Other forms of dyspnea: Secondary | ICD-10-CM | POA: Insufficient documentation

## 2013-09-05 DIAGNOSIS — E079 Disorder of thyroid, unspecified: Secondary | ICD-10-CM | POA: Insufficient documentation

## 2013-09-05 DIAGNOSIS — R011 Cardiac murmur, unspecified: Secondary | ICD-10-CM | POA: Insufficient documentation

## 2013-09-05 DIAGNOSIS — Z79899 Other long term (current) drug therapy: Secondary | ICD-10-CM | POA: Diagnosis not present

## 2013-09-05 DIAGNOSIS — Z880 Allergy status to penicillin: Secondary | ICD-10-CM | POA: Diagnosis not present

## 2013-09-05 DIAGNOSIS — R0789 Other chest pain: Secondary | ICD-10-CM | POA: Diagnosis not present

## 2013-09-05 DIAGNOSIS — I10 Essential (primary) hypertension: Secondary | ICD-10-CM | POA: Diagnosis not present

## 2013-09-05 DIAGNOSIS — R0600 Dyspnea, unspecified: Secondary | ICD-10-CM

## 2013-09-05 HISTORY — DX: Unspecified atrial fibrillation: I48.91

## 2013-09-05 HISTORY — DX: Disorder of thyroid, unspecified: E07.9

## 2013-09-05 LAB — BASIC METABOLIC PANEL
Anion gap: 12 (ref 5–15)
BUN: 15 mg/dL (ref 6–23)
CO2: 27 meq/L (ref 19–32)
Calcium: 9.8 mg/dL (ref 8.4–10.5)
Chloride: 100 mEq/L (ref 96–112)
Creatinine, Ser: 0.87 mg/dL (ref 0.50–1.35)
GFR calc Af Amer: >90 mL/min (ref >90)
GFR, EST NON AFRICAN AMERICAN: 90 mL/min — AB (ref >90)
GLUCOSE: 103 mg/dL — AB (ref 70–99)
POTASSIUM: 3.7 meq/L (ref 3.7–5.3)
SODIUM: 139 meq/L (ref 137–147)

## 2013-09-05 LAB — CBC
HEMATOCRIT: 48.4 % (ref 39.0–52.0)
Hemoglobin: 16.4 g/dL (ref 13.0–17.0)
MCH: 30.2 pg (ref 26.0–34.0)
MCHC: 33.9 g/dL (ref 30.0–36.0)
MCV: 89.1 fL (ref 78.0–100.0)
Platelets: 221 10*3/uL (ref 150–400)
RBC: 5.43 MIL/uL (ref 4.22–5.81)
RDW: 14.1 % (ref 11.5–15.5)
WBC: 9.6 10*3/uL (ref 4.0–10.5)

## 2013-09-05 LAB — HEPATIC FUNCTION PANEL
ALT: 12 U/L (ref 0–53)
AST: 17 U/L (ref 0–37)
Albumin: 3.5 g/dL (ref 3.5–5.2)
Alkaline Phosphatase: 59 U/L (ref 39–117)
BILIRUBIN TOTAL: 0.4 mg/dL (ref 0.3–1.2)
Bilirubin, Direct: <0.2 mg/dL (ref 0.0–0.3)
Total Protein: 6.7 g/dL (ref 6.0–8.3)

## 2013-09-05 LAB — TROPONIN I: Troponin I: <0.3 ng/mL (ref <0.30)

## 2013-09-05 LAB — I-STAT TROPONIN, ED: TROPONIN I, POC: 0 ng/mL (ref 0.00–0.08)

## 2013-09-05 LAB — PRO B NATRIURETIC PEPTIDE: PRO B NATRI PEPTIDE: 49.1 pg/mL (ref 0–125)

## 2013-09-05 MED ORDER — GI COCKTAIL ~~LOC~~
30.0000 mL | Freq: Once | ORAL | Status: AC
  Administered 2013-09-05: 30 mL via ORAL
  Filled 2013-09-05: qty 30

## 2013-09-05 NOTE — Discharge Instructions (Signed)
We saw you in the ER for the chest pain/shortness of breath. All of our cardiac workup is normal, including labs, EKG and chest X-RAY are normal. The ultrasound of the abdomen is also normal. We are not sure what is causing your discomfort, but we feel comfortable sending you home at this time. The workup in the ER is not complete, and you should follow up with your primary care doctor for further evaluation.   Chest Pain (Nonspecific) It is often hard to give a specific diagnosis for the cause of chest pain. There is always a chance that your pain could be related to something serious, such as a heart attack or a blood clot in the lungs. You need to follow up with your health care provider for further evaluation. CAUSES   Heartburn.  Pneumonia or bronchitis.  Anxiety or stress.  Inflammation around your heart (pericarditis) or lung (pleuritis or pleurisy).  A blood clot in the lung.  A collapsed lung (pneumothorax). It can develop suddenly on its own (spontaneous pneumothorax) or from trauma to the chest.  Shingles infection (herpes zoster virus). The chest wall is composed of bones, muscles, and cartilage. Any of these can be the source of the pain.  The bones can be bruised by injury.  The muscles or cartilage can be strained by coughing or overwork.  The cartilage can be affected by inflammation and become sore (costochondritis). DIAGNOSIS  Lab tests or other studies may be needed to find the cause of your pain. Your health care provider may have you take a test called an ambulatory electrocardiogram (ECG). An ECG records your heartbeat patterns over a 24-hour period. You may also have other tests, such as:  Transthoracic echocardiogram (TTE). During echocardiography, sound waves are used to evaluate how blood flows through your heart.  Transesophageal echocardiogram (TEE).  Cardiac monitoring. This allows your health care provider to monitor your heart rate and rhythm in  real time.  Holter monitor. This is a portable device that records your heartbeat and can help diagnose heart arrhythmias. It allows your health care provider to track your heart activity for several days, if needed.  Stress tests by exercise or by giving medicine that makes the heart beat faster. TREATMENT   Treatment depends on what may be causing your chest pain. Treatment may include:  Acid blockers for heartburn.  Anti-inflammatory medicine.  Pain medicine for inflammatory conditions.  Antibiotics if an infection is present.  You may be advised to change lifestyle habits. This includes stopping smoking and avoiding alcohol, caffeine, and chocolate.  You may be advised to keep your head raised (elevated) when sleeping. This reduces the chance of acid going backward from your stomach into your esophagus. Most of the time, nonspecific chest pain will improve within 2-3 days with rest and mild pain medicine.  HOME CARE INSTRUCTIONS   If antibiotics were prescribed, take them as directed. Finish them even if you start to feel better.  For the next few days, avoid physical activities that bring on chest pain. Continue physical activities as directed.  Do not use any tobacco products, including cigarettes, chewing tobacco, or electronic cigarettes.  Avoid drinking alcohol.  Only take medicine as directed by your health care provider.  Follow your health care provider's suggestions for further testing if your chest pain does not go away.  Keep any follow-up appointments you made. If you do not go to an appointment, you could develop lasting (chronic) problems with pain. If there is any  problem keeping an appointment, call to reschedule. SEEK MEDICAL CARE IF:   Your chest pain does not go away, even after treatment.  You have a rash with blisters on your chest.  You have a fever. SEEK IMMEDIATE MEDICAL CARE IF:   You have increased chest pain or pain that spreads to your arm,  neck, jaw, back, or abdomen.  You have shortness of breath.  You have an increasing cough, or you cough up blood.  You have severe back or abdominal pain.  You feel nauseous or vomit.  You have severe weakness.  You faint.  You have chills. This is an emergency. Do not wait to see if the pain will go away. Get medical help at once. Call your local emergency services (911 in [place] Do not drive yourself to the hospital. MAKE SURE YOU:   Understand these instructions.  Will watch your condition.  Will get help right away if you are not doing well or get worse. Document Released: 11/05/2004 Document Revised: 01/31/2013 Document Reviewed: 09/01/2007 [facility] Patient Information 2015 [place], [place]. This information is not intended to replace advice given to you by your health care provider. Make sure you discuss any questions you have with your health care provider.

## 2013-09-05 NOTE — ED Provider Notes (Signed)
CSN: [id number]     Arrival date & time 09/05/13  2536 History   First MD Initiated Contact with Patient 09/05/13 0329     Chief Complaint  Patient presents with  . Shortness of Breath     (Consider location/radiation/quality/duration/timing/severity/associated sxs/prior Treatment) HPI Comments: Pt comes in with cc of chest discomfort and dyspnea. Pt has had sx for about 2 days now. Patient has had chest tightness like this and is s/p stress in May - which was neg. PMHx of afib, HTN, heart murmur. Pt also has fam hx of CAD. Pt reports that his sx usually occur at night time, he has hx of GERD, but this patient is not GERD. He had same tightness that led to stress. + dyspnea. None of his sx are exertional  -in fact no specific aggravating or relieving factors. PT also thinks he has gained 8 lbs in the last 2-3 days and he is having some abdominal girth.    Patient is a 63 y.o. male presenting with shortness of breath. The history is provided by the patient.  Shortness of Breath Associated symptoms: abdominal pain and chest pain   Associated symptoms: no cough     Past Medical History  Diagnosis Date  . Hypertension   . Heart murmur   . A-fib     history of  . Thyroid disease     hyper "zapped"   Past Surgical History  Procedure Laterality Date  . Knee surgery    . Appendectomy     Family History  Problem Relation Age of Onset  . Heart failure Father   . Heart disease Father   . Stroke Maternal Grandfather   . Heart disease Paternal Grandfather    History  Substance Use Topics  . Smoking status: Former Research scientist (life sciences)  . Smokeless tobacco: Not on file  . Alcohol Use: No    Review of Systems  Constitutional: Negative for chills, activity change and appetite change.  Eyes: Negative for visual disturbance.  Respiratory: Positive for shortness of breath. Negative for cough and chest tightness.   Cardiovascular: Positive for chest pain.  Gastrointestinal: Positive for abdominal  pain. Negative for abdominal distention.  Genitourinary: Negative for dysuria, enuresis and difficulty urinating.  Musculoskeletal: Negative for arthralgias.  Neurological: Negative for dizziness and light-headedness.  Psychiatric/Behavioral: Negative for confusion.      Allergies  Penicillins  Home Medications   Prior to Admission medications   Medication Sig Start Date End Date Taking? Authorizing Provider  Calcium-Magnesium (CAL-MAG PO) Take 1 each by mouth daily.   Yes Historical Provider, MD  co-enzyme Q-10 30 MG capsule Take 30 mg by mouth daily.    Yes Historical Provider, MD  levothyroxine (SYNTHROID, LEVOTHROID) 100 MCG tablet Take 100 mcg by mouth daily.   Yes Historical Provider, MD  olmesartan (BENICAR) 20 MG tablet Take 20 mg by mouth daily.   Yes Historical Provider, MD  omega-3 acid ethyl esters (LOVAZA) 1 G capsule Take 1 g by mouth daily.    Yes Historical Provider, MD   BP 162/80  Pulse 47  Temp(Src) 98.4 F (36.9 C) (Oral)  Resp 18  Ht 5' 9.5" (1.765 m)  Wt 254 lb (115.214 kg)  BMI 36.98 kg/m2  SpO2 97% Physical Exam  Nursing note and vitals reviewed. Constitutional: He is oriented to person, place, and time. He appears well-developed.  HENT:  Head: Normocephalic and atraumatic.  Eyes: Conjunctivae and EOM are normal. Pupils are equal, round, and reactive to light.  Neck: Normal range of motion. Neck supple.  Cardiovascular: Normal rate and regular rhythm.   Pulmonary/Chest: Effort normal and breath sounds normal.  Abdominal: Soft. Bowel sounds are normal. He exhibits no distension. There is no tenderness. There is no rebound and no guarding.  Neurological: He is alert and oriented to person, place, and time.  Skin: Skin is warm.    ED Course  Procedures (including critical care time) Labs Review Labs Reviewed  BASIC METABOLIC PANEL - Abnormal; Notable for the following:    Glucose, Bld 103 (*)    GFR calc non Af Amer 90 (*)    All other  components within normal limits  CBC  PRO B NATRIURETIC PEPTIDE  HEPATIC FUNCTION PANEL  I-STAT TROPOININ, ED    Imaging Review No results found.   EKG Interpretation   Date/Time:  Tuesday September 05 2013 03:00:17 EDT Ventricular Rate:  47 PR Interval:  189 QRS Duration: 88 QT Interval:  438 QTC Calculation: 387 R Axis:   4 Text Interpretation:  Sinus bradycardia NO ACUTE CHANGES Confirmed by  Kathrynn Humble, MD, Thelma Comp ([id number]) on 09/05/2013 3:08:29 AM      MDM   Final diagnoses:  Chest discomfort  Dyspnea   Pt with atypical chest pain and dyspnea. Pt's HEART score is 3 - for atypical sx, age and risk factors. trops x 2 neg, and ekg is showing no acute concerns.  No concerns for dissection , PE based on hx and exam. Will d.c.  Varney Biles, MD 09/13/13 [phone]

## 2013-09-05 NOTE — ED Notes (Signed)
Patient c/o SOB with tightness to his chest x1.5 days. Patient states he has had an 8 pound weight gain over 2 days without an increase in PO intake. Patient states SOB is worse when laying flat.

## 2014-02-21 ENCOUNTER — Encounter (INDEPENDENT_AMBULATORY_CARE_PROVIDER_SITE_OTHER): Payer: Federal, State, Local not specified - PPO | Admitting: Ophthalmology

## 2014-02-21 DIAGNOSIS — D3131 Benign neoplasm of right choroid: Secondary | ICD-10-CM

## 2014-02-21 DIAGNOSIS — H33011 Retinal detachment with single break, right eye: Secondary | ICD-10-CM

## 2014-02-21 DIAGNOSIS — H35033 Hypertensive retinopathy, bilateral: Secondary | ICD-10-CM

## 2014-02-21 DIAGNOSIS — I10 Essential (primary) hypertension: Secondary | ICD-10-CM

## 2014-02-21 DIAGNOSIS — H43813 Vitreous degeneration, bilateral: Secondary | ICD-10-CM

## 2014-03-07 ENCOUNTER — Ambulatory Visit (INDEPENDENT_AMBULATORY_CARE_PROVIDER_SITE_OTHER): Payer: Federal, State, Local not specified - PPO | Admitting: Ophthalmology

## 2014-03-07 DIAGNOSIS — H33301 Unspecified retinal break, right eye: Secondary | ICD-10-CM

## 2014-03-16 ENCOUNTER — Ambulatory Visit (INDEPENDENT_AMBULATORY_CARE_PROVIDER_SITE_OTHER): Payer: Federal, State, Local not specified - PPO | Admitting: Ophthalmology

## 2014-03-16 DIAGNOSIS — H33301 Unspecified retinal break, right eye: Secondary | ICD-10-CM

## 2014-07-16 ENCOUNTER — Ambulatory Visit (INDEPENDENT_AMBULATORY_CARE_PROVIDER_SITE_OTHER): Payer: Federal, State, Local not specified - PPO | Admitting: Ophthalmology

## 2014-07-16 DIAGNOSIS — I10 Essential (primary) hypertension: Secondary | ICD-10-CM | POA: Diagnosis not present

## 2014-07-16 DIAGNOSIS — H35033 Hypertensive retinopathy, bilateral: Secondary | ICD-10-CM | POA: Diagnosis not present

## 2014-07-16 DIAGNOSIS — H2513 Age-related nuclear cataract, bilateral: Secondary | ICD-10-CM | POA: Diagnosis not present

## 2014-07-16 DIAGNOSIS — D3131 Benign neoplasm of right choroid: Secondary | ICD-10-CM

## 2014-07-16 DIAGNOSIS — H43813 Vitreous degeneration, bilateral: Secondary | ICD-10-CM

## 2014-07-16 DIAGNOSIS — H33301 Unspecified retinal break, right eye: Secondary | ICD-10-CM

## 2014-09-27 ENCOUNTER — Encounter: Payer: Self-pay | Admitting: *Deleted

## 2014-10-17 ENCOUNTER — Encounter: Payer: Self-pay | Admitting: Cardiovascular Disease

## 2014-12-05 ENCOUNTER — Emergency Department (HOSPITAL_COMMUNITY)
Admission: EM | Admit: 2014-12-05 | Discharge: 2014-12-05 | Disposition: A | Discharge status: Home / Self Care | Payer: Federal, State, Local not specified - PPO | Attending: Emergency Medicine | Admitting: Emergency Medicine

## 2014-12-05 ENCOUNTER — Encounter (HOSPITAL_COMMUNITY): Payer: Self-pay

## 2014-12-05 ENCOUNTER — Emergency Department (HOSPITAL_COMMUNITY): Payer: Federal, State, Local not specified - PPO

## 2014-12-05 DIAGNOSIS — R1011 Right upper quadrant pain: Secondary | ICD-10-CM | POA: Insufficient documentation

## 2014-12-05 DIAGNOSIS — Z9049 Acquired absence of other specified parts of digestive tract: Secondary | ICD-10-CM | POA: Diagnosis not present

## 2014-12-05 DIAGNOSIS — Z880 Allergy status to penicillin: Secondary | ICD-10-CM | POA: Insufficient documentation

## 2014-12-05 DIAGNOSIS — G4733 Obstructive sleep apnea (adult) (pediatric): Secondary | ICD-10-CM | POA: Insufficient documentation

## 2014-12-05 DIAGNOSIS — Z87891 Personal history of nicotine dependence: Secondary | ICD-10-CM | POA: Diagnosis not present

## 2014-12-05 DIAGNOSIS — E0590 Thyrotoxicosis, unspecified without thyrotoxic crisis or storm: Secondary | ICD-10-CM | POA: Diagnosis not present

## 2014-12-05 DIAGNOSIS — R011 Cardiac murmur, unspecified: Secondary | ICD-10-CM | POA: Insufficient documentation

## 2014-12-05 DIAGNOSIS — Z79899 Other long term (current) drug therapy: Secondary | ICD-10-CM | POA: Insufficient documentation

## 2014-12-05 DIAGNOSIS — I10 Essential (primary) hypertension: Secondary | ICD-10-CM | POA: Insufficient documentation

## 2014-12-05 DIAGNOSIS — Z9981 Dependence on supplemental oxygen: Secondary | ICD-10-CM | POA: Diagnosis not present

## 2014-12-05 LAB — CBC
HCT: 48.4 % (ref 39.0–52.0)
Hemoglobin: 16.2 g/dL (ref 13.0–17.0)
MCH: 29.8 pg (ref 26.0–34.0)
MCHC: 33.5 g/dL (ref 30.0–36.0)
MCV: 89.1 fL (ref 78.0–100.0)
Platelets: 239 10*3/uL (ref 150–400)
RBC: 5.43 MIL/uL (ref 4.22–5.81)
RDW: 13.6 % (ref 11.5–15.5)
WBC: 7.9 10*3/uL (ref 4.0–10.5)

## 2014-12-05 LAB — COMPREHENSIVE METABOLIC PANEL
ALT: 25 U/L (ref 17–63)
AST: 25 U/L (ref 15–41)
Albumin: 4.2 g/dL (ref 3.5–5.0)
Alkaline Phosphatase: 69 U/L (ref 38–126)
Anion gap: 9 (ref 5–15)
BUN: 12 mg/dL (ref 6–20)
CALCIUM: 9.6 mg/dL (ref 8.9–10.3)
CHLORIDE: 102 mmol/L (ref 101–111)
CO2: 26 mmol/L (ref 22–32)
Creatinine, Ser: 0.85 mg/dL (ref 0.61–1.24)
GFR calc Af Amer: >60 mL/min (ref >60)
GFR calc non Af Amer: >60 mL/min (ref >60)
Glucose, Bld: 95 mg/dL (ref 65–99)
Potassium: 4.1 mmol/L (ref 3.5–5.1)
SODIUM: 137 mmol/L (ref 135–145)
Total Bilirubin: 0.9 mg/dL (ref 0.3–1.2)
Total Protein: 7.6 g/dL (ref 6.5–8.1)

## 2014-12-05 LAB — URINALYSIS, ROUTINE W REFLEX MICROSCOPIC
Bilirubin Urine: NEGATIVE
GLUCOSE, UA: NEGATIVE mg/dL
HGB URINE DIPSTICK: NEGATIVE
Ketones, ur: NEGATIVE mg/dL
Leukocytes, UA: NEGATIVE
Nitrite: NEGATIVE
PH: 6 (ref 5.0–8.0)
Protein, ur: NEGATIVE mg/dL
Specific Gravity, Urine: 1.014 (ref 1.005–1.030)
Urobilinogen, UA: 0.2 mg/dL (ref 0.0–1.0)

## 2014-12-05 LAB — LIPASE, BLOOD: LIPASE: 31 U/L (ref 11–51)

## 2014-12-05 MED ORDER — HYDROMORPHONE HCL 1 MG/ML IJ SOLN
1.0000 mg | Freq: Once | INTRAMUSCULAR | Status: AC
  Administered 2014-12-05: 1 mg via INTRAVENOUS
  Filled 2014-12-05: qty 1

## 2014-12-05 MED ORDER — ONDANSETRON HCL 4 MG/2ML IJ SOLN
4.0000 mg | Freq: Once | INTRAMUSCULAR | Status: AC
  Administered 2014-12-05: 4 mg via INTRAVENOUS
  Filled 2014-12-05: qty 2

## 2014-12-05 MED ORDER — SUCRALFATE 1 GM/10ML PO SUSP: 1.0000 g | Freq: Three times a day (TID) | ORAL | Status: DC

## 2014-12-05 MED ORDER — GI COCKTAIL ~~LOC~~
30.0000 mL | Freq: Once | ORAL | Status: AC
  Administered 2014-12-05: 30 mL via ORAL
  Filled 2014-12-05: qty 30

## 2014-12-05 MED ORDER — IOHEXOL 300 MG/ML  SOLN
25.0000 mL | Freq: Once | INTRAMUSCULAR | Status: AC | PRN
  Administered 2014-12-05: 25 mL via ORAL

## 2014-12-05 MED ORDER — IOHEXOL 300 MG/ML  SOLN
100.0000 mL | Freq: Once | INTRAMUSCULAR | Status: AC | PRN
  Administered 2014-12-05: 100 mL via INTRAVENOUS

## 2014-12-05 NOTE — Discharge Instructions (Signed)

## 2014-12-05 NOTE — ED Notes (Signed)
Pt currently in radiology.

## 2014-12-05 NOTE — ED Notes (Signed)
Patient transported to Ultrasound 

## 2014-12-05 NOTE — ED Provider Notes (Signed)
The patient has a history of hypothyroidism, he has had progressive weight gain until his thyroid medication was increased, it is now lost proximally 10 pounds, over the last 4 months he feels increased abdominal girth and has had some right upper quadrant pain recently especially after eating. On exam the patient has no significant tenderness in the right upper quadrant (after getting pain medication). He has some nausea after eating, he has no other abdominal tenderness, normal heart and lung exam, no guarding, no masses. He has several small subcutaneous nodules scattered throughout the abdominal wall.  Ultrasound negative, labs unremarkable, the patient will proceed to CT scan to rule out any other intra-abdominal sources.  Medical screening examination/treatment/procedure(s) were conducted as a shared visit with non-physician practitioner(s) and myself.  I personally evaluated the patient during the encounter.  Clinical Impression:   Final diagnoses:  Right upper quadrant pain         Noemi Chapel, MD 12/06/14 [phone]

## 2014-12-05 NOTE — ED Notes (Signed)
Patient c/o intermittent RUQ pain x 3 1/2 weeks and intensified in the past 3 days. Patient denies N/V/D, but does feel bloated and tender to touch.

## 2014-12-05 NOTE — ED Provider Notes (Signed)
CSN: [id number]     Arrival date & time 12/05/14  1219 History   First MD Initiated Contact with Patient 12/05/14 1237     Chief Complaint  Patient presents with  . Abdominal Pain     (Consider location/radiation/quality/duration/timing/severity/associated sxs/prior Treatment) HPI Comments: Patient presents to the emergency department with chief complaint of right upper quadrant abdominal pain. He reports that he has been having progressively worsening symptoms for the past 3-1/2 weeks. He denies any associated fevers chills. He denies any nausea, vomiting, or diarrhea. He states that he does have a bloated sensation. Patient states that symptoms are worsened with movement and bending.  He has not tried taking anything to alleviate his symptoms.  The history is provided by the patient. No language interpreter was used.    Past Medical History  Diagnosis Date  . Hypertension   . Heart murmur   . A-fib [facility])     history of  . Thyroid disease     hyper "zapped"  . OSA on CPAP   . Hyperlipidemia    Past Surgical History  Procedure Laterality Date  . Knee surgery    . Appendectomy     Family History  Problem Relation Age of Onset  . Heart failure Father   . Heart disease Father   . Stroke Maternal Grandfather   . Heart disease Paternal Grandfather    Social History  Substance Use Topics  . Smoking status: Former Research scientist (life sciences)  . Smokeless tobacco: Never Used  . Alcohol Use: No    Review of Systems  Constitutional: Negative for fever and chills.  Respiratory: Negative for shortness of breath.   Cardiovascular: Negative for chest pain.  Gastrointestinal: Positive for abdominal pain. Negative for nausea, vomiting, diarrhea and constipation.  Genitourinary: Negative for dysuria.  All other systems reviewed and are negative.     Allergies  Penicillins  Home Medications   Prior to Admission medications   Medication Sig Start Date End Date Taking? Authorizing Provider   AXIRON 30 MG/ACT SOLN Apply 4 application topically daily.  11/28/14  Yes Historical Provider, MD  BENICAR HCT 40-25 MG tablet Take 1 tablet by mouth daily. 11/24/14  Yes Historical Provider, MD  co-enzyme Q-10 30 MG capsule Take 30 mg by mouth daily.    Yes Historical Provider, MD  levothyroxine (SYNTHROID, LEVOTHROID) 175 MCG tablet Take 175 mcg by mouth daily. 11/07/14  Yes Historical Provider, MD  omega-3 acid ethyl esters (LOVAZA) 1 G capsule Take 1 g by mouth daily.    Yes Historical Provider, MD  OVER THE COUNTER MEDICATION Take 1 capsule by mouth daily. Zyflamend Capsule   Yes Historical Provider, MD   BP 147/79 mmHg  Pulse 62  Temp(Src) 98.2 F (36.8 C) (Oral)  Resp 17  SpO2 97% Physical Exam  Constitutional: He is oriented to person, place, and time. He appears well-developed and well-nourished.  HENT:  Head: Normocephalic and atraumatic.  Eyes: Conjunctivae and EOM are normal. Pupils are equal, round, and reactive to light. Right eye exhibits no discharge. Left eye exhibits no discharge. No scleral icterus.  Neck: Normal range of motion. Neck supple. No JVD present.  Cardiovascular: Normal rate, regular rhythm and normal heart sounds.  Exam reveals no gallop and no friction rub.   No murmur heard. Pulmonary/Chest: Effort normal and breath sounds normal. No respiratory distress. He has no wheezes. He has no rales. He exhibits no tenderness.  Abdominal: Soft. He exhibits no distension and no mass. There is no  tenderness. There is no rebound and no guarding.  RUQ tender to palpation, no other tenderness to palpation  Musculoskeletal: Normal range of motion. He exhibits no edema or tenderness.  Neurological: He is alert and oriented to person, place, and time.  Skin: Skin is warm and dry.  Psychiatric: He has a normal mood and affect. His behavior is normal. Judgment and thought content normal.  Nursing note and vitals reviewed.   ED Course  Procedures (including critical care  time)  Results for orders placed or performed during the hospital encounter of 12/05/14  Lipase, blood  Result Value Ref Range   Lipase 31 11 - 51 U/L  Comprehensive metabolic panel  Result Value Ref Range   Sodium 137 135 - 145 mmol/L   Potassium 4.1 3.5 - 5.1 mmol/L   Chloride 102 101 - 111 mmol/L   CO2 26 22 - 32 mmol/L   Glucose, Bld 95 65 - 99 mg/dL   BUN 12 6 - 20 mg/dL   Creatinine, Ser 0.85 0.61 - 1.24 mg/dL   Calcium 9.6 8.9 - 10.3 mg/dL   Total Protein 7.6 6.5 - 8.1 g/dL   Albumin 4.2 3.5 - 5.0 g/dL   AST 25 15 - 41 U/L   ALT 25 17 - 63 U/L   Alkaline Phosphatase 69 38 - 126 U/L   Total Bilirubin 0.9 0.3 - 1.2 mg/dL   GFR calc non Af Amer >60 >60 mL/min   GFR calc Af Amer >60 >60 mL/min   Anion gap 9 5 - 15  CBC  Result Value Ref Range   WBC 7.9 4.0 - 10.5 K/uL   RBC 5.43 4.22 - 5.81 MIL/uL   Hemoglobin 16.2 13.0 - 17.0 g/dL   HCT 48.4 39.0 - 52.0 %   MCV 89.1 78.0 - 100.0 fL   MCH 29.8 26.0 - 34.0 pg   MCHC 33.5 30.0 - 36.0 g/dL   RDW 13.6 11.5 - 15.5 %   Platelets 239 150 - 400 K/uL   US Abdomen Limited  12/05/2014  CLINICAL DATA:  Right upper quadrant pain. EXAM: US ABDOMEN LIMITED - RIGHT UPPER QUADRANT COMPARISON:  None. FINDINGS: Gallbladder: No gallstones or wall thickening visualized. No sonographic Murphy sign noted. Wall thickness is 1.6 mm. Common bile duct: Diameter: 4.1 mm. Liver: There is mildly increased echogenicity of the liver, usually seen with hepatic steatosis. There is a 4.3 x 4.4 x 4.5 cm simple cyst within the left lobe of the liver, present before. IMPRESSION: No evidence of acute cholecystitis or cholelithiasis. Mild hepatic steatosis. Unchanged left liver cyst. Electronically Signed   By: Fidela Salisbury M.D.   On: 12/05/2014 14:12     I have personally reviewed and evaluated these images and lab results as part of my medical decision-making.    MDM   Final diagnoses:  None    Patient with abdominal pain.  Mostly located in  the RUQ quadrant.  Concern for gallbladder disease.  Will check [place] and labs.  Will treat pain.  Patient uncomfortable, but stable appearing.  Labs and RUQ [place] are reassuring.  [place] does show a simple cyst in liver.  This is not a new finding.  2:56 PM Patient seen by and discussed with Dr. Sabra Heck.  Patient offered discharge vs additional workup with CT scan.  Feel that both options are appropriate at this time.  Patient states that he is a widower and is very concerned about cancer.  Will proceed with the CT scan.  Plan for discharge to home if CT is negative.   Patient signed out to [place], PA-C at shift change.  Plan:  Follow-up on CT.  DC to home if negative with PCP follow-up.     Montine Circle, PA-C 12/05/14 1550  Noemi Chapel, MD 12/06/14 [phone]

## 2014-12-18 ENCOUNTER — Encounter: Payer: Self-pay | Admitting: Cardiovascular Disease

## 2015-05-03 ENCOUNTER — Ambulatory Visit (INDEPENDENT_AMBULATORY_CARE_PROVIDER_SITE_OTHER): Payer: Federal, State, Local not specified - PPO

## 2015-05-03 ENCOUNTER — Ambulatory Visit (INDEPENDENT_AMBULATORY_CARE_PROVIDER_SITE_OTHER): Payer: Federal, State, Local not specified - PPO | Admitting: Family Medicine

## 2015-05-03 VITALS — BP 124/72 | HR 96 | Temp 100.7°F | Resp 17 | Ht 69.5 in | Wt 268.0 lb

## 2015-05-03 DIAGNOSIS — R509 Fever, unspecified: Secondary | ICD-10-CM

## 2015-05-03 DIAGNOSIS — M609 Myositis, unspecified: Secondary | ICD-10-CM

## 2015-05-03 DIAGNOSIS — R059 Cough, unspecified: Secondary | ICD-10-CM

## 2015-05-03 DIAGNOSIS — IMO0001 Reserved for inherently not codable concepts without codable children: Secondary | ICD-10-CM

## 2015-05-03 DIAGNOSIS — R05 Cough: Secondary | ICD-10-CM

## 2015-05-03 DIAGNOSIS — M791 Myalgia: Secondary | ICD-10-CM

## 2015-05-03 DIAGNOSIS — J111 Influenza due to unidentified influenza virus with other respiratory manifestations: Secondary | ICD-10-CM

## 2015-05-03 LAB — POCT INFLUENZA A/B
Influenza A, POC: POSITIVE — AB
Influenza B, POC: NEGATIVE

## 2015-05-03 MED ORDER — OSELTAMIVIR PHOSPHATE 75 MG PO CAPS: 75.0000 mg | ORAL_CAPSULE | Freq: Two times a day (BID) | ORAL | Status: DC

## 2015-05-03 MED ORDER — HYDROCOD POLST-CPM POLST ER 10-8 MG/5ML PO SUER: 5.0000 mL | Freq: Two times a day (BID) | ORAL | Status: DC | PRN

## 2015-05-03 MED ORDER — IBUPROFEN 200 MG PO TABS
400.0000 mg | ORAL_TABLET | Freq: Once | ORAL | Status: AC
  Administered 2015-05-03: 400 mg via ORAL

## 2015-05-03 NOTE — Progress Notes (Signed)
This is a 65 year old retired man who comes in with a 2-3 day history of dry cough, myalgia, fever, and nasal congestion. He has a history of pneumonia and wants to make sure this is not starting again. He's also thought that he might have the flu. Her graft objective:  Objective: Patient is flushed, alert and cooperative BP 124/72 mmHg  Pulse 96  Temp(Src) 100.7 F (38.2 C) (Oral)  Resp 17  Ht 5' 9.5" (1.765 m)  Wt 268 lb (121.564 kg)  BMI 39.02 kg/m2  SpO2 96% HEENT: Unremarkable with exception of some narrowing of the nasal passages Chest: Bilateral rhonchi and wheezes on expiration Heart: Regular no murmur Extremities: No difficulty moving  Results for orders placed or performed in visit on 05/03/15  POCT Influenza A/B  Result Value Ref Range   Influenza A, POC Positive (A) Negative   Influenza B, POC Negative Negative   Chest x-ray shows not definite infiltrate.  This chart was scribed in my presence and reviewed by me personally.    ICD-9-CM ICD-10-CM   1. Cough 786.2 R05 DG Chest 2 View     POCT Influenza A/B     oseltamivir (TAMIFLU) 75 MG capsule     chlorpheniramine-HYDROcodone (TUSSIONEX PENNKINETIC ER) 10-8 MG/5ML SUER  2. Fever, unspecified fever cause 780.60 R50.9 DG Chest 2 View     POCT Influenza A/B     ibuprofen (ADVIL,MOTRIN) tablet 400 mg     oseltamivir (TAMIFLU) 75 MG capsule     chlorpheniramine-HYDROcodone (TUSSIONEX PENNKINETIC ER) 10-8 MG/5ML SUER  3. Myalgia and myositis 729.1 M79.1 DG Chest 2 View    M60.9 POCT Influenza A/B     oseltamivir (TAMIFLU) 75 MG capsule     chlorpheniramine-HYDROcodone (TUSSIONEX PENNKINETIC ER) 10-8 MG/5ML SUER  4. Influenza with respiratory manifestation 487.1 J11.1 oseltamivir (TAMIFLU) 75 MG capsule     Signed, Robyn Haber, MD

## 2015-05-03 NOTE — Patient Instructions (Signed)

## 2015-07-18 ENCOUNTER — Ambulatory Visit (INDEPENDENT_AMBULATORY_CARE_PROVIDER_SITE_OTHER): Payer: Federal, State, Local not specified - PPO | Admitting: Ophthalmology

## 2015-07-18 DIAGNOSIS — D3131 Benign neoplasm of right choroid: Secondary | ICD-10-CM

## 2015-07-18 DIAGNOSIS — I10 Essential (primary) hypertension: Secondary | ICD-10-CM | POA: Diagnosis not present

## 2015-07-18 DIAGNOSIS — H43813 Vitreous degeneration, bilateral: Secondary | ICD-10-CM | POA: Diagnosis not present

## 2015-07-18 DIAGNOSIS — H33301 Unspecified retinal break, right eye: Secondary | ICD-10-CM

## 2015-07-18 DIAGNOSIS — H35033 Hypertensive retinopathy, bilateral: Secondary | ICD-10-CM | POA: Diagnosis not present

## 2015-07-18 DIAGNOSIS — H2513 Age-related nuclear cataract, bilateral: Secondary | ICD-10-CM

## 2015-11-22 IMAGING — CT CT ABD-PELV W/ CM
2 of 5 series · 16 of 46 positions shown, 18 images · IV contrast (omnipaque)
Comparison: None

CLINICAL DATA: Intermittent RIGHT upper quadrant pain for 3.5 weeks
intensified in past 3 days, bloated, tender to touch, hypertension,
atrial fibrillation, former smoker

EXAM:
CT ABDOMEN AND PELVIS WITH CONTRAST
TECHNIQUE: Multidetector CT imaging of the abdomen and pelvis was performed
using the standard protocol following bolus administration of
intravenous contrast. Sagittal and coronal MPR images reconstructed
from axial data set.
CONTRAST:  100mL OMNIPAQUE IOHEXOL 300 MG/ML SOLN IV. Dilute oral
contrast.

[Series 2: abd/pel with · axial · 0.94mm/px · z∈[+972,+1402]mm · 13 of 98 slices shown, 15 images]
[im 6/98  soft-tissue]
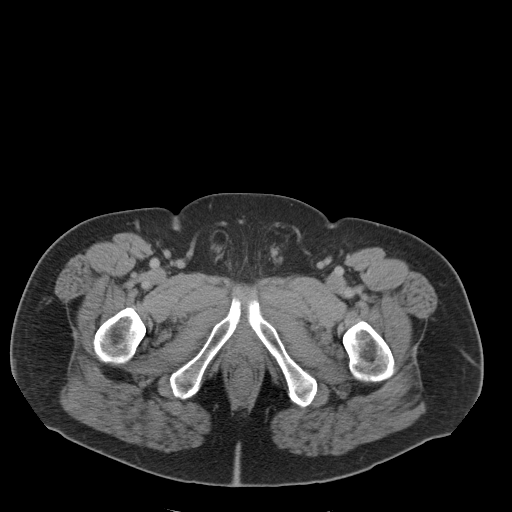
[im 6/98  bone]
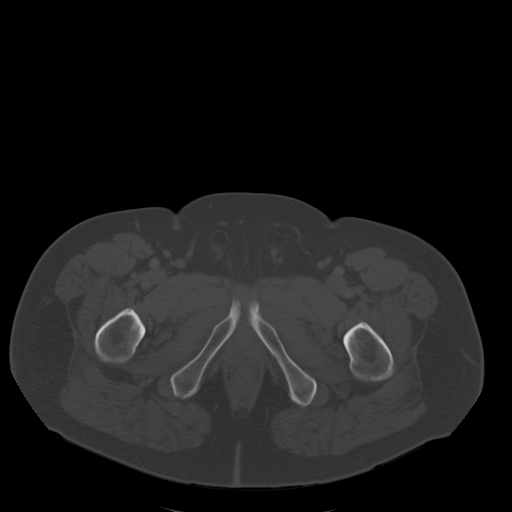
[im 16/98  soft-tissue]
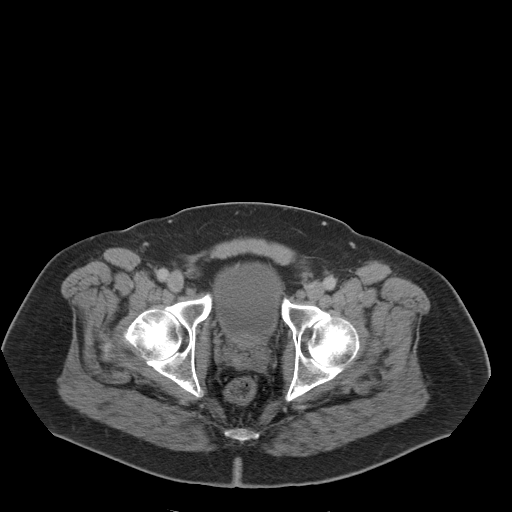
[im 21/98  soft-tissue]
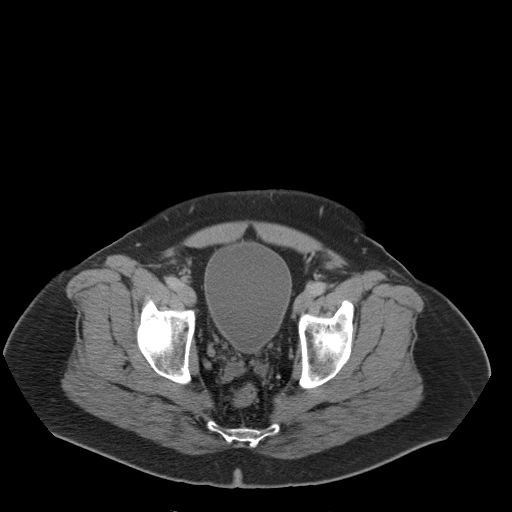
[im 26/98  soft-tissue]
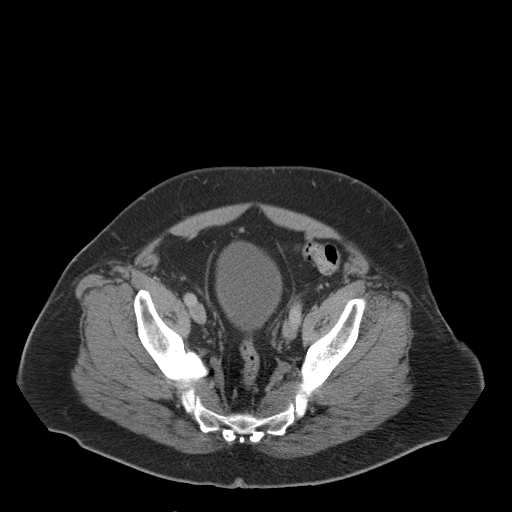
[im 36/98  soft-tissue]
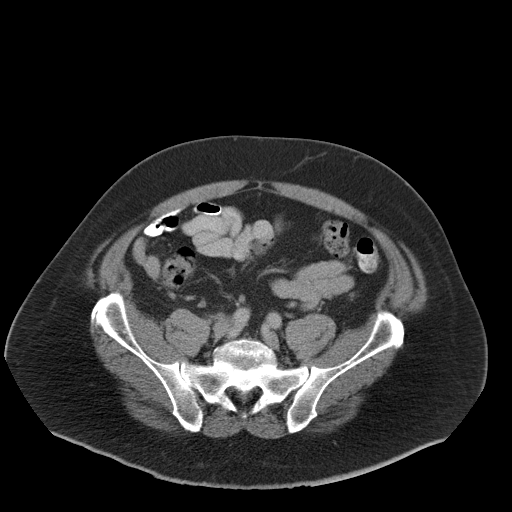
[im 41/98  soft-tissue]
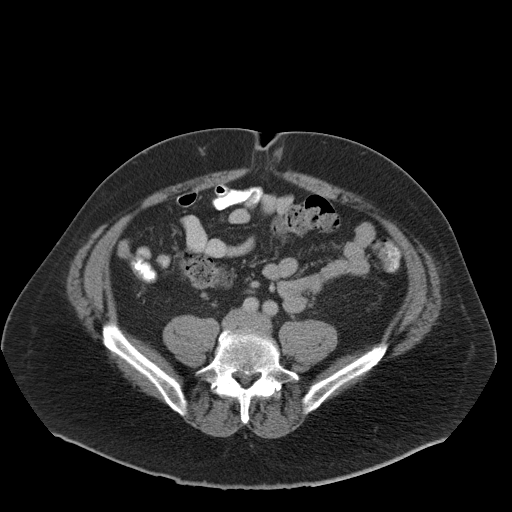
[im 52/98  soft-tissue]
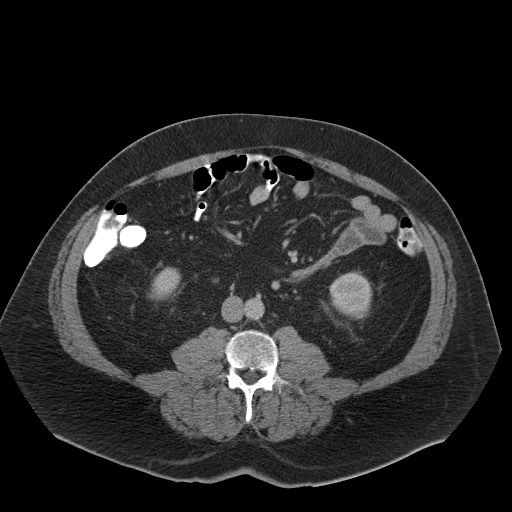
[im 57/98  soft-tissue]
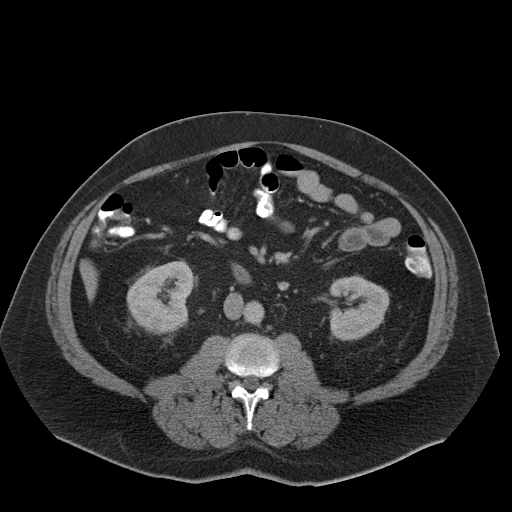
[im 62/98  soft-tissue]
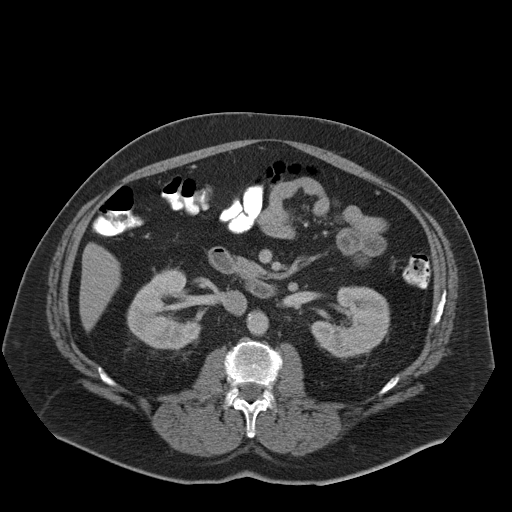
[im 62/98  bone]
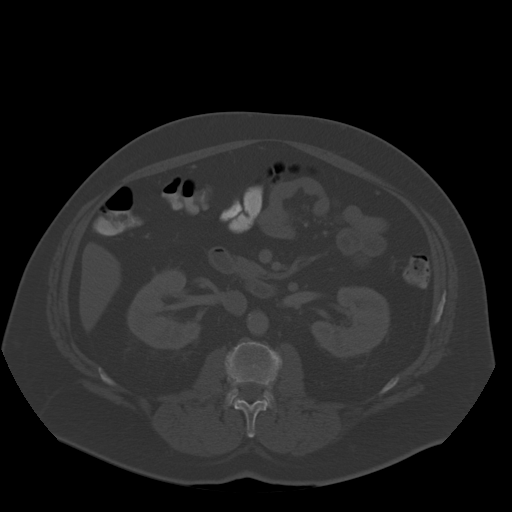
[im 72/98  soft-tissue]
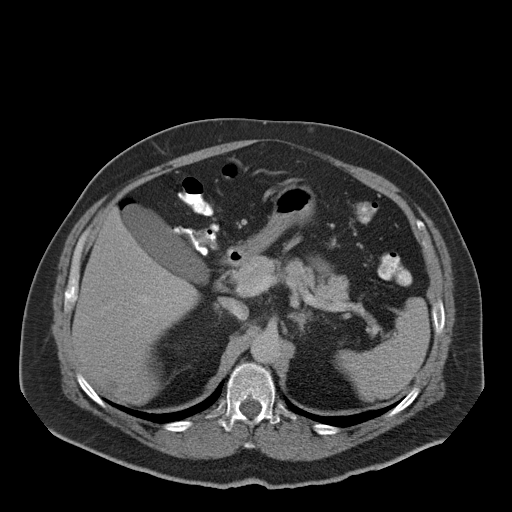
[im 77/98  soft-tissue]
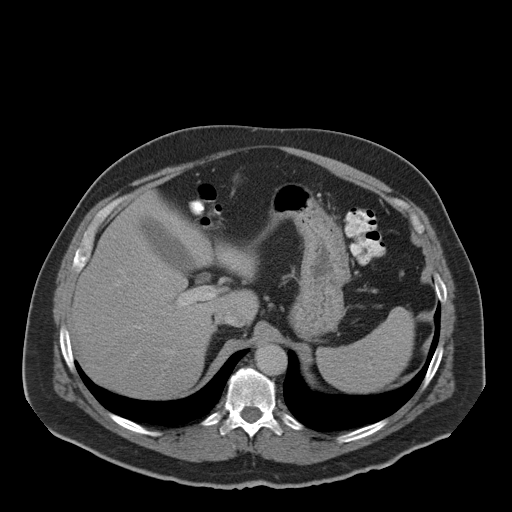
[im 82/98  soft-tissue]
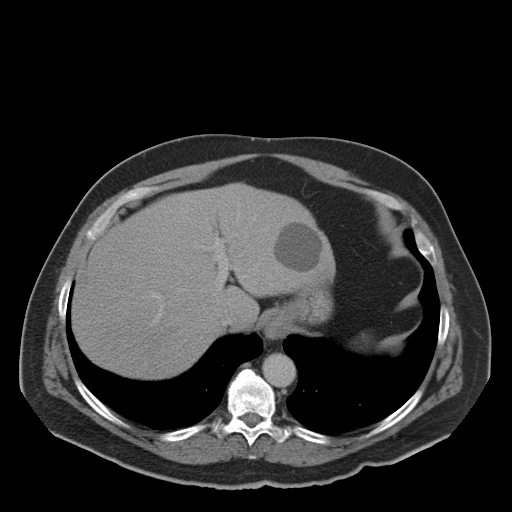
[im 92/98  soft-tissue]
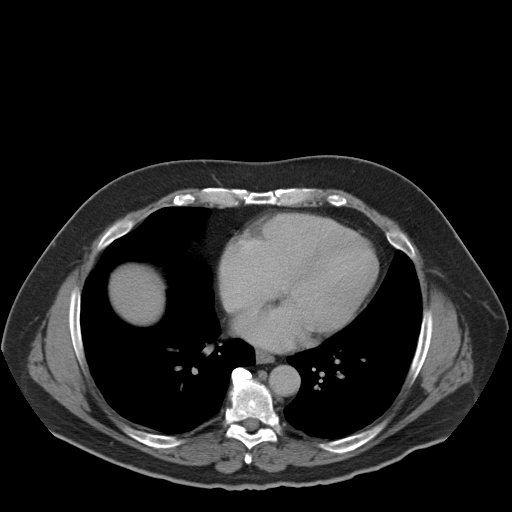

[Series 3: coronal a/|p · coronal · 0.91mm/px · 3 of 99 slices shown]
[im 33/99  soft-tissue]
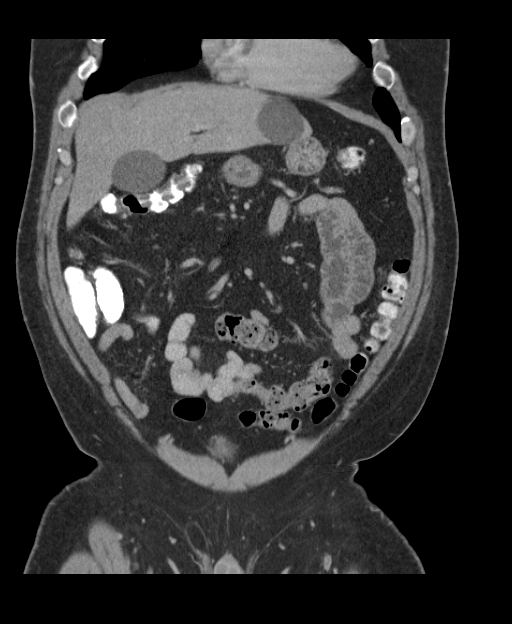
[im 44/99  soft-tissue]
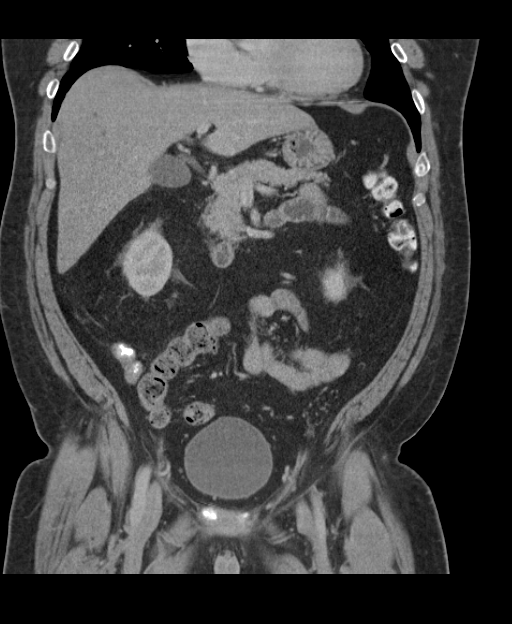
[im 55/99  soft-tissue]
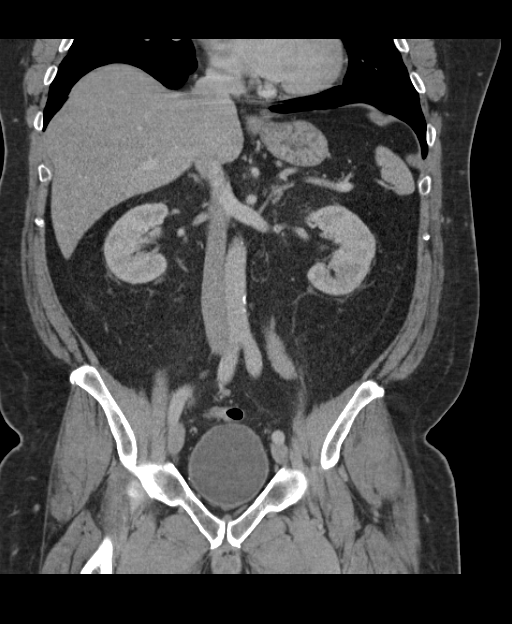

[16 of 46 positions shown; findings below may reference images not displayed]

FINDINGS: Minimal bibasilar atelectasis.

Multiple hepatic cysts largest lateral segment LEFT lobe 4.8 x
cm.

Tiny RIGHT renal cyst 10 mm diameter image 41.

Liver, gallbladder, spleen, pancreas, kidneys, and adrenal glands
normal appearance.

Appendix surgically absent by history.

Unremarkable bladder and ureters.

Minimal prostatic enlargement gland 4.7 x 3.8 cm image 86.

Stomach and bowel loops normal appearance.

No mass, adenopathy, free air, free fluid or inflammatory process.

Tiny RIGHT inguinal hernia containing fat.

Small umbilical hernia containing fat.

No acute osseous findings.
IMPRESSION: Multiple hepatic cysts.

Small umbilical and RIGHT inguinal hernias containing fat.

Minimal prostatic enlargement.

No definite acute intra-abdominal or intrapelvic abnormalities.

## 2016-01-13 ENCOUNTER — Ambulatory Visit (INDEPENDENT_AMBULATORY_CARE_PROVIDER_SITE_OTHER): Payer: Federal, State, Local not specified - PPO | Admitting: Orthopaedic Surgery

## 2016-01-13 DIAGNOSIS — M25562 Pain in left knee: Secondary | ICD-10-CM | POA: Diagnosis not present

## 2016-01-13 DIAGNOSIS — M1712 Unilateral primary osteoarthritis, left knee: Secondary | ICD-10-CM | POA: Diagnosis not present

## 2016-01-13 DIAGNOSIS — G8929 Other chronic pain: Secondary | ICD-10-CM

## 2016-01-13 NOTE — Progress Notes (Signed)
   Office Visit Note   Patient: [name]           Date of Birth: [birth year]           MRN: [id number] Visit Date: 01/13/2016              Requested by: Deland Pretty, MD [address] [employer] [place] [place], [place] [address] PCP: [name], MD   Assessment & Plan: Visit Diagnoses:  1. Chronic pain of left knee   2. Unilateral primary osteoarthritis, left knee     Plan: Given the failure of all forms conservative treatment including even arthroscopic intervention of left knee we will proceed with a left unicompartmental partial knee replacement. I showed him a knee model and explained in detail with the surgery involves including a thorough discussion of risk and benefits of surgery. He would like to have this done sometime in early January. We'll work on setting of the surgery and we'll see him back in 2 weeks postoperative but no x-rays of be needed at that visit.  Follow-Up Instructions: Return for 2 weeks post-op.   Orders:  No orders of the defined types were placed in this encounter.  No orders of the defined types were placed in this encounter.     Procedures: No procedures performed   Clinical Data: No additional findings.   Subjective: Chief Complaint  Patient presents with  . Left Knee - Follow-up    Patient states he is starting to get pain in right knee due to over compensating. A lot of activity is extremely painful the next day. Discuss partial   Although he did well with arthroscopic intervention of his left knee is still bothering him quite a bit only on the medial joint line as reports  HPI  Review of Systems   Objective: Vital Signs: There were no vitals taken for this visit.  Physical Exam His alert and oriented 3 Ortho Exam Examination of his left knee shows a slight effusion. There is only medial joint line tenderness and no lateral joint line tenderness. There is no significant patellofemoral crepitation at all. His  Lachman's is negative. He has a varus deformity is easily correctable and his range of motion is full. Specialty Comments:  No specialty comments available.  Imaging: No results found.   PMFS History: Patient Active Problem List   Diagnosis Date Noted  . Chest pain with moderate risk for cardiac etiology 06/19/2013  . HTN (hypertension) 06/19/2013  . HLD (hyperlipidemia) 06/19/2013  . Obesity 06/19/2013   Past Medical History:  Diagnosis Date  . A-fib [facility])    history of  . Heart murmur   . Hyperlipidemia   . Hypertension   . OSA on CPAP   . Thyroid disease    hyper "zapped"    Family History  Problem Relation Age of Onset  . Heart failure Father   . Heart disease Father   . Stroke Maternal Grandfather   . Heart disease Paternal Grandfather     Past Surgical History:  Procedure Laterality Date  . APPENDECTOMY    . KNEE SURGERY     Social History   Occupational History  . Not on file.   Social History Main Topics  . Smoking status: Former Research scientist (life sciences)  . Smokeless tobacco: Never Used  . Alcohol use No  . Drug use: No  . Sexual activity: Yes

## 2016-01-27 ENCOUNTER — Encounter: Payer: Self-pay | Admitting: Oncology

## 2016-01-27 ENCOUNTER — Telehealth: Payer: Self-pay | Admitting: Oncology

## 2016-01-27 NOTE — Telephone Encounter (Signed)
Pt confirmed appt, verified demo and insurance, mailed pt letter, faxed referring provider appt date/time. °

## 2016-02-13 ENCOUNTER — Other Ambulatory Visit (INDEPENDENT_AMBULATORY_CARE_PROVIDER_SITE_OTHER): Payer: Self-pay | Admitting: Physician Assistant

## 2016-02-13 NOTE — Progress Notes (Signed)
Scheduling pre op- please place SURGICAL ORDERS IN EPIC  thanks

## 2016-02-20 ENCOUNTER — Encounter (HOSPITAL_COMMUNITY): Payer: Self-pay

## 2016-02-20 NOTE — Patient Instructions (Signed)
[name]  02/20/2016   Your procedure is scheduled on: 1/119/2018  Report to [facility]  Entrance take [place]  elevators to 3rd floor to  [place] at [phone].  Call this number if you have problems the morning of surgery [phone]   Remember: ONLY 1 PERSON MAY GO WITH YOU TO SHORT STAY TO GET  READY MORNING OF [name].  Do not eat food or drink liquids :After Midnight.     Take these medicines the morning of surgery with A SIP OF WATER: levothyroxine(Synthroid), Sertraline(Zoloft)                                You may not have any metal on your body including hair pins and              piercings  Do not wear jewelry, make-up, lotions, powders or perfumes, deodorant             Do not wear nail polish.  Do not shave  48 hours prior to surgery.              Men may shave face and neck.   Do not bring valuables to the hospital. [place].  Contacts, dentures or bridgework may not be worn into surgery.  Leave suitcase in the car. After surgery it may be brought to your room.               Please read over the following fact sheets you were given: _____________________________________________________________________             [facility] - Preparing for Surgery Before surgery, you can play an important role.  Because skin is not sterile, your skin needs to be as free of germs as possible.  You can reduce the number of germs on your skin by washing with CHG (chlorahexidine gluconate) soap before surgery.  CHG is an antiseptic cleaner which kills germs and bonds with the skin to continue killing germs even after washing. Please DO NOT use if you have an allergy to CHG or antibacterial soaps.  If your skin becomes reddened/irritated stop using the CHG and inform your nurse when you arrive at Short Stay. Do not shave (including legs and underarms) for at least 48 hours prior to the first  CHG shower.  You may shave your face/neck. Please follow these instructions carefully:  1.  Shower with CHG Soap the night before surgery and the  morning of Surgery.  2.  If you choose to wash your hair, wash your hair first as usual with your  normal  shampoo.  3.  After you shampoo, rinse your hair and body thoroughly to remove the  shampoo.                           4.  Use CHG as you would any other liquid soap.  You can apply chg directly  to the skin and wash                       Gently with a scrungie or clean washcloth.  5.  Apply the CHG Soap to your body  ONLY FROM THE NECK DOWN.   Do not use on face/ open                           Wound or open sores. Avoid contact with eyes, ears mouth and genitals (private parts).                       Wash face,  Genitals (private parts) with your normal soap.             6.  Wash thoroughly, paying special attention to the area where your surgery  will be performed.  7.  Thoroughly rinse your body with warm water from the neck down.  8.  DO NOT shower/wash with your normal soap after using and rinsing off  the CHG Soap.                9.  Pat yourself dry with a clean towel.            10.  Wear clean pajamas.            11.  Place clean sheets on your bed the night of your first shower and do not  sleep with pets. Day of Surgery : Do not apply any lotions/deodorants the morning of surgery.  Please wear clean clothes to the hospital/surgery center.  FAILURE TO FOLLOW THESE INSTRUCTIONS MAY RESULT IN THE CANCELLATION OF YOUR SURGERY PATIENT SIGNATURE_________________________________  NURSE SIGNATURE__________________________________  ________________________________________________________________________   [name]  An incentive spirometer is a tool that can help keep your lungs clear and active. This tool measures how well you are filling your lungs with each breath. Taking long deep breaths may help reverse or decrease the  chance of developing breathing (pulmonary) problems (especially infection) following:  A long period of time when you are unable to move or be active. BEFORE THE PROCEDURE   If the spirometer includes an indicator to show your best effort, your nurse or respiratory therapist will set it to a desired goal.  If possible, sit up straight or lean slightly forward. Try not to slouch.  Hold the incentive spirometer in an upright position. INSTRUCTIONS FOR USE  1. Sit on the edge of your bed if possible, or sit up as far as you can in bed or on a chair. 2. Hold the incentive spirometer in an upright position. 3. Breathe out normally. 4. Place the mouthpiece in your mouth and seal your lips tightly around it. 5. Breathe in slowly and as deeply as possible, raising the piston or the ball toward the top of the column. 6. Hold your breath for 3-5 seconds or for as long as possible. Allow the piston or ball to fall to the bottom of the column. 7. Remove the mouthpiece from your mouth and breathe out normally. 8. Rest for a few seconds and repeat Steps 1 through 7 at least 10 times every 1-2 hours when you are awake. Take your time and take a few normal breaths between deep breaths. 9. The spirometer may include an indicator to show your best effort. Use the indicator as a goal to work toward during each repetition. 10. After each set of 10 deep breaths, practice coughing to be sure your lungs are clear. If you have an incision (the cut made at the time of surgery), support your incision when coughing by placing a pillow or rolled up towels firmly against it. Once  you are able to get out of bed, walk around indoors and cough well. You may stop using the incentive spirometer when instructed by your caregiver.  RISKS AND COMPLICATIONS  Take your time so you do not get dizzy or light-headed.  If you are in pain, you may need to take or ask for pain medication before doing incentive spirometry. It is harder  to take a deep breath if you are having pain. AFTER USE  Rest and breathe slowly and easily.  It can be helpful to keep track of a log of your progress. Your caregiver can provide you with a simple table to help with this. If you are using the spirometer at home, follow these instructions: [place] IF:   You are having difficultly using the spirometer.  You have trouble using the spirometer as often as instructed.  Your pain medication is not giving enough relief while using the spirometer.  You develop fever of 100.5 F (38.1 C) or higher. SEEK IMMEDIATE MEDICAL CARE IF:   You cough up bloody sputum that had not been present before.  You develop fever of 102 F (38.9 C) or greater.  You develop worsening pain at or near the incision site. MAKE SURE YOU:   Understand these instructions.  Will watch your condition.  Will get help right away if you are not doing well or get worse. Document Released: 06/08/2006 Document Revised: 04/20/2011 Document Reviewed: 08/09/2006 ExitCare Patient Information 2014 ExitCare, [place].   ________________________________________________________________________  WHAT IS A BLOOD TRANSFUSION? Blood Transfusion Information  A transfusion is the replacement of blood or some of its parts. Blood is made up of multiple cells which provide different functions.  Red blood cells carry oxygen and are used for blood loss replacement.  White blood cells fight against infection.  Platelets control bleeding.  Plasma helps clot blood.  Other blood products are available for specialized needs, such as hemophilia or other clotting disorders. BEFORE THE TRANSFUSION  Who gives blood for transfusions?   Healthy volunteers who are fully evaluated to make sure their blood is safe. This is blood bank blood. Transfusion therapy is the safest it has ever been in the practice of medicine. Before blood is taken from a donor, a complete history is taken  to make sure that person has no history of diseases nor engages in risky social behavior (examples are intravenous drug use or sexual activity with multiple partners). The donor's travel history is screened to minimize risk of transmitting infections, such as malaria. The donated blood is tested for signs of infectious diseases, such as HIV and hepatitis. The blood is then tested to be sure it is compatible with you in order to minimize the chance of a transfusion reaction. If you or a relative donates blood, this is often done in anticipation of surgery and is not appropriate for emergency situations. It takes many days to process the donated blood. RISKS AND COMPLICATIONS Although transfusion therapy is very safe and saves many lives, the main dangers of transfusion include:   Getting an infectious disease.  Developing a transfusion reaction. This is an allergic reaction to something in the blood you were given. Every precaution is taken to prevent this. The decision to have a blood transfusion has been considered carefully by your caregiver before blood is given. Blood is not given unless the benefits outweigh the risks. AFTER THE TRANSFUSION  Right after receiving a blood transfusion, you will usually feel much better and more energetic. This  is especially true if your red blood cells have gotten low (anemic). The transfusion raises the level of the red blood cells which carry oxygen, and this usually causes an energy increase.  The nurse administering the transfusion will monitor you carefully for complications. HOME CARE INSTRUCTIONS  No special instructions are needed after a transfusion. You may find your energy is better. Speak with your caregiver about any limitations on activity for underlying diseases you may have. SEEK MEDICAL CARE IF:   Your condition is not improving after your transfusion.  You develop redness or irritation at the intravenous (IV) site. SEEK IMMEDIATE MEDICAL CARE  IF:  Any of the following symptoms occur over the next 12 hours:  Shaking chills.  You have a temperature by mouth above 102 F (38.9 C), not controlled by medicine.  Chest, back, or muscle pain.  People around you feel you are not acting correctly or are confused.  Shortness of breath or difficulty breathing.  Dizziness and fainting.  You get a rash or develop hives.  You have a decrease in urine output.  Your urine turns a dark color or changes to pink, red, or brown. Any of the following symptoms occur over the next 10 days:  You have a temperature by mouth above 102 F (38.9 C), not controlled by medicine.  Shortness of breath.  Weakness after normal activity.  The white part of the eye turns yellow (jaundice).  You have a decrease in the amount of urine or are urinating less often.  Your urine turns a dark color or changes to pink, red, or brown. Document Released: 01/24/2000 Document Revised: 04/20/2011 Document Reviewed: 09/12/2007 [facility] Patient Information 2014 [place], [place].  _______________________________________________________________________

## 2016-02-20 NOTE — Progress Notes (Signed)
CXR 05/03/15 in [facility]

## 2016-02-24 ENCOUNTER — Encounter (HOSPITAL_COMMUNITY): Payer: Self-pay

## 2016-02-24 ENCOUNTER — Telehealth: Payer: Self-pay | Admitting: Oncology

## 2016-02-24 ENCOUNTER — Encounter (HOSPITAL_COMMUNITY)
Admission: RE | Admit: 2016-02-24 | Discharge: 2016-02-24 | Disposition: A | Discharge status: Home / Self Care | Payer: Federal, State, Local not specified - PPO | Source: Ambulatory Visit | Attending: Orthopaedic Surgery | Admitting: Orthopaedic Surgery

## 2016-02-24 DIAGNOSIS — Z01810 Encounter for preprocedural cardiovascular examination: Secondary | ICD-10-CM | POA: Insufficient documentation

## 2016-02-24 DIAGNOSIS — M1712 Unilateral primary osteoarthritis, left knee: Secondary | ICD-10-CM | POA: Insufficient documentation

## 2016-02-24 DIAGNOSIS — Z01812 Encounter for preprocedural laboratory examination: Secondary | ICD-10-CM | POA: Diagnosis not present

## 2016-02-24 HISTORY — DX: Hypothyroidism, unspecified: E03.9

## 2016-02-24 LAB — BASIC METABOLIC PANEL
ANION GAP: 8 (ref 5–15)
BUN: 16 mg/dL (ref 6–20)
CALCIUM: 9.1 mg/dL (ref 8.9–10.3)
CO2: 28 mmol/L (ref 22–32)
Chloride: 101 mmol/L (ref 101–111)
Creatinine, Ser: 1.04 mg/dL (ref 0.61–1.24)
Glucose, Bld: 90 mg/dL (ref 65–99)
Potassium: 3.8 mmol/L (ref 3.5–5.1)
SODIUM: 137 mmol/L (ref 135–145)

## 2016-02-24 LAB — CBC
HCT: 50.7 % (ref 39.0–52.0)
HEMOGLOBIN: 17.2 g/dL — AB (ref 13.0–17.0)
MCH: 30.4 pg (ref 26.0–34.0)
MCHC: 33.9 g/dL (ref 30.0–36.0)
MCV: 89.7 fL (ref 78.0–100.0)
Platelets: 230 10*3/uL (ref 150–400)
RBC: 5.65 MIL/uL (ref 4.22–5.81)
RDW: 14.1 % (ref 11.5–15.5)
WBC: 8.3 10*3/uL (ref 4.0–10.5)

## 2016-02-24 LAB — SURGICAL PCR SCREEN
MRSA, PCR: NEGATIVE
STAPHYLOCOCCUS AUREUS: NEGATIVE

## 2016-02-24 NOTE — Telephone Encounter (Signed)
Pt walked in to reschedule appt from 01/24 to 01/16 due to surgery on 01/19

## 2016-02-25 ENCOUNTER — Ambulatory Visit (HOSPITAL_BASED_OUTPATIENT_CLINIC_OR_DEPARTMENT_OTHER): Payer: Federal, State, Local not specified - PPO | Admitting: Oncology

## 2016-02-25 ENCOUNTER — Encounter: Payer: Self-pay | Admitting: Oncology

## 2016-02-25 ENCOUNTER — Ambulatory Visit (HOSPITAL_BASED_OUTPATIENT_CLINIC_OR_DEPARTMENT_OTHER): Payer: Federal, State, Local not specified - PPO

## 2016-02-25 ENCOUNTER — Encounter: Payer: Federal, State, Local not specified - PPO | Admitting: Oncology

## 2016-02-25 ENCOUNTER — Telehealth: Payer: Self-pay | Admitting: Oncology

## 2016-02-25 VITALS — BP 156/82 | HR 99 | Temp 99.3°F | Resp 18 | Ht 69.5 in | Wt 260.7 lb

## 2016-02-25 DIAGNOSIS — D751 Secondary polycythemia: Secondary | ICD-10-CM

## 2016-02-25 DIAGNOSIS — D582 Other hemoglobinopathies: Secondary | ICD-10-CM

## 2016-02-25 LAB — COMPREHENSIVE METABOLIC PANEL
ALT: 14 U/L (ref 0–55)
ANION GAP: 8 meq/L (ref 3–11)
AST: 17 U/L (ref 5–34)
Albumin: 3.8 g/dL (ref 3.5–5.0)
Alkaline Phosphatase: 74 U/L (ref 40–150)
BILIRUBIN TOTAL: 0.61 mg/dL (ref 0.20–1.20)
BUN: 16.9 mg/dL (ref 7.0–26.0)
CHLORIDE: 101 meq/L (ref 98–109)
CO2: 30 meq/L — AB (ref 22–29)
Calcium: 9.7 mg/dL (ref 8.4–10.4)
Creatinine: 1.1 mg/dL (ref 0.7–1.3)
EGFR: 72 mL/min/{1.73_m2} — AB (ref >90)
GLUCOSE: 95 mg/dL (ref 70–140)
Potassium: 4.2 mEq/L (ref 3.5–5.1)
SODIUM: 139 meq/L (ref 136–145)
TOTAL PROTEIN: 7.4 g/dL (ref 6.4–8.3)

## 2016-02-25 LAB — CBC WITH DIFFERENTIAL/PLATELET
BASO%: 0.3 % (ref 0.0–2.0)
Basophils Absolute: 0 10*3/uL (ref 0.0–0.1)
EOS ABS: 0.2 10*3/uL (ref 0.0–0.5)
EOS%: 2 % (ref 0.0–7.0)
HCT: 51.7 % — ABNORMAL HIGH (ref 38.4–49.9)
HGB: 17.3 g/dL — ABNORMAL HIGH (ref 13.0–17.1)
LYMPH%: 25.6 % (ref 14.0–49.0)
MCH: 30.2 pg (ref 27.2–33.4)
MCHC: 33.5 g/dL (ref 32.0–36.0)
MCV: 90.2 fL (ref 79.3–98.0)
MONO#: 0.7 10*3/uL (ref 0.1–0.9)
MONO%: 8.4 % (ref 0.0–14.0)
NEUT%: 63.7 % (ref 39.0–75.0)
NEUTROS ABS: 5 10*3/uL (ref 1.5–6.5)
Platelets: 216 10*3/uL (ref 140–400)
RBC: 5.73 10*6/uL (ref 4.20–5.82)
RDW: 14 % (ref 11.0–14.6)
WBC: 7.9 10*3/uL (ref 4.0–10.3)
lymph#: 2 10*3/uL (ref 0.9–3.3)

## 2016-02-25 NOTE — Telephone Encounter (Signed)
Gave patient avs report and appointments for January  °

## 2016-02-25 NOTE — Progress Notes (Signed)
[facility] Initial Visit:  Patient Care Team: Deland Pretty, MD as PCP - General (Internal Medicine)  CHIEF COMPLAINTS/PURPOSE OF CONSULTATION:  Elevated hemoglobin/hematocrit  HISTORY OF PRESENTING ILLNESS: [name] 66 y.o. male Presented today for evaluation of elevated hemoglobin/hematocrit. Patient had blood work performed on 02/24/16 which demonstrated a WBC 8.3K, hemoglobin 17.2 g/dL, hematocrit 50.7%, MCV 89.7, platelet count 230k. His previous CBC on 12/05/14 demonstrated a hemoglobin 16.2 g/dL, hematocrit 48.4%. Overall he states that he feels well, except for left knee pain for which he is having knee replacement surgery in 3 days. He has been on testosterone replacement injections for the past year and a half. He also has a history of objective sleep apnea for which he is on CPAP. He denies any other pulmonary disease including asthma, COPD, emphysema. He does not smoke.  Review of Systems  Constitutional: Negative for appetite change, chills, fatigue and fever.  HENT:   Negative for hearing loss, lump/mass, mouth sores, sore throat and tinnitus.   Eyes: Negative for eye problems and icterus.  Respiratory: Negative for chest tightness, cough, hemoptysis, shortness of breath and wheezing.   Cardiovascular: Negative for chest pain, leg swelling and palpitations.  Gastrointestinal: Negative for abdominal distention, abdominal pain, blood in stool, diarrhea, nausea and vomiting.  Endocrine: Negative.  Negative for hot flashes.  Genitourinary: Negative for difficulty urinating, frequency and hematuria.   Musculoskeletal: Negative for arthralgias and neck pain.       +left knee pain  Skin: Negative for itching and rash.  Neurological: Negative for dizziness, headaches and speech difficulty.  Hematological: Negative for adenopathy. Does not bruise/bleed easily.  Psychiatric/Behavioral: Negative for confusion. The patient is not nervous/anxious.      MEDICAL HISTORY: Past Medical History:  Diagnosis Date  . A-fib ([place]) 2007   one occurrence , no issues following thryorid procedure   . Heart murmur    since age 70  . Hyperlipidemia   . Hypertension   . Hypothyroidism   . OSA on CPAP   . Thyroid disease    hyper "zapped"    SURGICAL HISTORY: Past Surgical History:  Procedure Laterality Date  . APPENDECTOMY    . KNEE SURGERY    . ligament stapled   1980s    SOCIAL HISTORY: Social History   Social History  . Marital status: Married    Spouse name: N/A  . Number of children: N/A  . Years of education: N/A   Occupational History  . Not on file.   Social History Main Topics  . Smoking status: Former Smoker    Types: Pipe  . Smokeless tobacco: Never Used     Comment: quit 40 years ago  . Alcohol use Yes     Comment: rarely , glass of wine once a year  . Drug use: No  . Sexual activity: Yes   Other Topics Concern  . Not on file   Social History Narrative  . No narrative on file    FAMILY HISTORY Family History  Problem Relation Age of Onset  . Heart failure Father   . Heart disease Father   . Stroke Maternal Grandfather   . Heart disease Paternal Grandfather     ALLERGIES:  is allergic to penicillins.  MEDICATIONS:  Current Outpatient Prescriptions  Medication Sig Dispense Refill  . levothyroxine (SYNTHROID, LEVOTHROID) 175 MCG tablet Take 175 mcg by mouth daily before breakfast.     . olmesartan-hydrochlorothiazide (BENICAR HCT) 40-25 MG tablet Take 1  tablet by mouth daily.    Marland Kitchen OVER THE COUNTER MEDICATION Take 1 capsule by mouth daily. Zyflamend Whole Body (10-herb blend for pain relief/inflammation--Rosemary,Turmeric, Green Tea, Ginseng, Chinese Skullcap, [place], [place], [place], holly basil, organic oregano)    . sertraline (ZOLOFT) 100 MG tablet Take 50 mg by mouth daily.      No current facility-administered medications for this visit.     PHYSICAL EXAMINATION:  ECOG  PERFORMANCE STATUS: 0 - Asymptomatic   Vitals:   02/25/16 1114  BP: (!) 156/82  Pulse: 99  Resp: 18  Temp: 99.3 F (37.4 C)    Filed Weights   02/25/16 1114  Weight: 260 lb 11.2 oz (118.3 kg)     Physical Exam  Constitutional: He is oriented to person, place, and time and well-developed, well-nourished, and in no distress. No distress.  HENT:  Head: Normocephalic and atraumatic.  Mouth/Throat: No oropharyngeal exudate.  Eyes: Conjunctivae are normal. Pupils are equal, round, and reactive to light. No scleral icterus.  Neck: Normal range of motion. Neck supple. No JVD present.  Cardiovascular: Normal rate, regular rhythm and normal heart sounds.  Exam reveals no gallop and no friction rub.   No murmur heard. Pulmonary/Chest: Breath sounds normal. No respiratory distress. He has no wheezes. He has no rales.  Abdominal: Soft. Bowel sounds are normal. He exhibits no distension. There is no tenderness. There is no guarding.  Musculoskeletal: He exhibits no edema or tenderness.  Lymphadenopathy:    He has no cervical adenopathy.  Neurological: He is alert and oriented to person, place, and time. No cranial nerve deficit.  Skin: Skin is warm and dry. No rash noted. No erythema. No pallor.  Psychiatric: Affect and judgment normal.     LABORATORY DATA: I have personally reviewed the data as listed:  Appointment on 02/25/2016  Component Date Value Ref Range Status  . WBC 02/25/2016 7.9  4.0 - 10.3 10e3/uL Final  . NEUT# 02/25/2016 5.0  1.5 - 6.5 10e3/uL Final  . HGB 02/25/2016 17.3* 13.0 - 17.1 g/dL Final  . HCT 02/25/2016 51.7* 38.4 - 49.9 % Final  . Platelets 02/25/2016 216  140 - 400 10e3/uL Final  . MCV 02/25/2016 90.2  79.3 - 98.0 fL Final  . MCH 02/25/2016 30.2  27.2 - 33.4 pg Final  . MCHC 02/25/2016 33.5  32.0 - 36.0 g/dL Final  . RBC 02/25/2016 5.73  4.20 - 5.82 10e6/uL Final  . RDW 02/25/2016 14.0  11.0 - 14.6 % Final  . lymph# 02/25/2016 2.0  0.9 - 3.3 10e3/uL  Final  . MONO# 02/25/2016 0.7  0.1 - 0.9 10e3/uL Final  . Eosinophils Absolute 02/25/2016 0.2  0.0 - 0.5 10e3/uL Final  . Basophils Absolute 02/25/2016 0.0  0.0 - 0.1 10e3/uL Final  . NEUT% 02/25/2016 63.7  39.0 - 75.0 % Final  . LYMPH% 02/25/2016 25.6  14.0 - 49.0 % Final  . MONO% 02/25/2016 8.4  0.0 - 14.0 % Final  . EOS% 02/25/2016 2.0  0.0 - 7.0 % Final  . BASO% 02/25/2016 0.3  0.0 - 2.0 % Final  . Sodium 02/25/2016 139  136 - 145 mEq/L Final  . Potassium 02/25/2016 4.2  3.5 - 5.1 mEq/L Final  . Chloride 02/25/2016 101  98 - 109 mEq/L Final  . CO2 02/25/2016 30* 22 - 29 mEq/L Final  . Glucose 02/25/2016 95  70 - 140 mg/dl Final  . BUN 02/25/2016 16.9  7.0 - 26.0 mg/dL Final  . Creatinine  02/25/2016 1.1  0.7 - 1.3 mg/dL Final  . Total Bilirubin 02/25/2016 0.61  0.20 - 1.20 mg/dL Final  . Alkaline Phosphatase 02/25/2016 74  40 - 150 U/L Final  . AST 02/25/2016 17  5 - 34 U/L Final  . ALT 02/25/2016 14  0 - 55 U/L Final  . Total Protein 02/25/2016 7.4  6.4 - 8.3 g/dL Final  . Albumin 02/25/2016 3.8  3.5 - 5.0 g/dL Final  . Calcium 02/25/2016 9.7  8.4 - 10.4 mg/dL Final  . Anion Gap 02/25/2016 8  3 - 11 mEq/L Final  . EGFR 02/25/2016 72* >90 ml/min/1.73 m2 Final  Hospital Outpatient Visit on 02/24/2016  Component Date Value Ref Range Status  . Sodium 02/24/2016 137  135 - 145 mmol/L Final  . Potassium 02/24/2016 3.8  3.5 - 5.1 mmol/L Final  . Chloride 02/24/2016 101  101 - 111 mmol/L Final  . CO2 02/24/2016 28  22 - 32 mmol/L Final  . Glucose, Bld 02/24/2016 90  65 - 99 mg/dL Final  . BUN 02/24/2016 16  6 - 20 mg/dL Final  . Creatinine, Ser 02/24/2016 1.04  0.61 - 1.24 mg/dL Final  . Calcium 02/24/2016 9.1  8.9 - 10.3 mg/dL Final  . GFR calc non Af Amer 02/24/2016 >60  >60 mL/min Final  . GFR calc Af Amer 02/24/2016 >60  >60 mL/min Final   Comment: (NOTE) The eGFR has been calculated using the CKD EPI equation. This calculation has not been validated in all clinical  situations. eGFR's persistently <60 mL/min signify possible Chronic Kidney Disease.   . Anion gap 02/24/2016 8  5 - 15 Final  . WBC 02/24/2016 8.3  4.0 - 10.5 K/uL Final  . RBC 02/24/2016 5.65  4.22 - 5.81 MIL/uL Final  . Hemoglobin 02/24/2016 17.2* 13.0 - 17.0 g/dL Final  . HCT 02/24/2016 50.7  39.0 - 52.0 % Final  . MCV 02/24/2016 89.7  78.0 - 100.0 fL Final  . MCH 02/24/2016 30.4  26.0 - 34.0 pg Final  . MCHC 02/24/2016 33.9  30.0 - 36.0 g/dL Final  . RDW 02/24/2016 14.1  11.5 - 15.5 % Final  . Platelets 02/24/2016 230  150 - 400 K/uL Final  . MRSA, PCR 02/24/2016 NEGATIVE  NEGATIVE Final  . Staphylococcus aureus 02/24/2016 NEGATIVE  NEGATIVE Final   Comment:        The Xpert SA Assay (FDA approved for NASAL specimens in patients over 90 years of age), is one component of a comprehensive surveillance program.  Test performance has been validated by [facility] for patients greater than or equal to 42 year old. It is not intended to diagnose infection nor to guide or monitor treatment.     RADIOGRAPHIC STUDIES: I have personally reviewed the radiological images as listed and agree with the findings in the report  No results found.  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  Return to clinic in 2 weeks to review lab results since patient will be having his knee surgery later this week and will not be very mobile.  Orders Placed This Encounter  Procedures  . CBC with Differential    Standing Status:   Future    Number of Occurrences:   1  Standing Expiration Date:   02/24/2017  . Comprehensive metabolic panel    Standing Status:   Future    Number of Occurrences:   1    Standing  Expiration Date:   02/24/2017  . Erythropoietin    Standing Status:   Future    Number of Occurrences:   1    Standing Expiration Date:   02/24/2017  . JAK2 (INCLUDING V617F AND EXON 12), MPL,& CALR W/RFL MPN PANEL (NGS)    Standing Status:   Future    Number of Occurrences:   1    Standing Expiration Date:   02/24/2017    All questions were answered. The patient knows to call the clinic with any problems, questions or concerns.  This note was electronically signed.    Twana First, MD  02/25/2016 1:54 PM

## 2016-02-26 LAB — ERYTHROPOIETIN: Erythropoietin: 8.1 m[IU]/mL (ref 2.6–18.5)

## 2016-02-28 ENCOUNTER — Ambulatory Visit (HOSPITAL_COMMUNITY): Payer: Federal, State, Local not specified - PPO

## 2016-02-28 ENCOUNTER — Ambulatory Visit (HOSPITAL_COMMUNITY): Payer: Federal, State, Local not specified - PPO | Admitting: Registered Nurse

## 2016-02-28 ENCOUNTER — Encounter (HOSPITAL_COMMUNITY): Payer: Self-pay | Admitting: *Deleted

## 2016-02-28 ENCOUNTER — Observation Stay (HOSPITAL_COMMUNITY)
Admission: RE | Admit: 2016-02-28 | Discharge: 2016-02-29 | Disposition: A | Discharge status: Home / Self Care | Payer: Federal, State, Local not specified - PPO | Source: Ambulatory Visit | Attending: Orthopaedic Surgery | Admitting: Orthopaedic Surgery

## 2016-02-28 ENCOUNTER — Encounter (HOSPITAL_COMMUNITY)
Admission: RE | Disposition: A | Discharge status: Home / Self Care | Payer: Self-pay | Source: Ambulatory Visit | Attending: Orthopaedic Surgery

## 2016-02-28 DIAGNOSIS — R011 Cardiac murmur, unspecified: Secondary | ICD-10-CM | POA: Insufficient documentation

## 2016-02-28 DIAGNOSIS — M25462 Effusion, left knee: Secondary | ICD-10-CM | POA: Insufficient documentation

## 2016-02-28 DIAGNOSIS — Z96652 Presence of left artificial knee joint: Secondary | ICD-10-CM

## 2016-02-28 DIAGNOSIS — M1712 Unilateral primary osteoarthritis, left knee: Secondary | ICD-10-CM | POA: Diagnosis not present

## 2016-02-28 DIAGNOSIS — G4733 Obstructive sleep apnea (adult) (pediatric): Secondary | ICD-10-CM | POA: Diagnosis not present

## 2016-02-28 DIAGNOSIS — Z7982 Long term (current) use of aspirin: Secondary | ICD-10-CM | POA: Insufficient documentation

## 2016-02-28 DIAGNOSIS — I10 Essential (primary) hypertension: Secondary | ICD-10-CM | POA: Insufficient documentation

## 2016-02-28 DIAGNOSIS — I4891 Unspecified atrial fibrillation: Secondary | ICD-10-CM | POA: Diagnosis not present

## 2016-02-28 DIAGNOSIS — E039 Hypothyroidism, unspecified: Secondary | ICD-10-CM | POA: Diagnosis not present

## 2016-02-28 DIAGNOSIS — Z880 Allergy status to penicillin: Secondary | ICD-10-CM | POA: Diagnosis not present

## 2016-02-28 DIAGNOSIS — E785 Hyperlipidemia, unspecified: Secondary | ICD-10-CM | POA: Insufficient documentation

## 2016-02-28 HISTORY — PX: PARTIAL KNEE ARTHROPLASTY: SHX2174

## 2016-02-28 HISTORY — DX: Presence of left artificial knee joint: Z96.652

## 2016-02-28 HISTORY — DX: Unilateral primary osteoarthritis, left knee: M17.12

## 2016-02-28 SURGERY — ARTHROPLASTY, KNEE, UNICOMPARTMENTAL
Anesthesia: Spinal | Site: Knee | Laterality: Left

## 2016-02-28 MED ORDER — ACETAMINOPHEN 325 MG PO TABS: 650.0000 mg | ORAL_TABLET | Freq: Four times a day (QID) | ORAL | Status: DC | PRN

## 2016-02-28 MED ORDER — ONDANSETRON HCL 4 MG/2ML IJ SOLN: 4.0000 mg | Freq: Four times a day (QID) | INTRAMUSCULAR | Status: DC | PRN

## 2016-02-28 MED ORDER — BUPIVACAINE HCL (PF) 0.5 % IJ SOLN
INTRAMUSCULAR | Status: AC
  Filled 2016-02-28: qty 30

## 2016-02-28 MED ORDER — ZOLPIDEM TARTRATE 5 MG PO TABS: 5.0000 mg | ORAL_TABLET | Freq: Every evening | ORAL | Status: DC | PRN

## 2016-02-28 MED ORDER — LACTATED RINGERS IV SOLN
INTRAVENOUS | Status: DC | PRN
  Administered 2016-02-28 (×3): via INTRAVENOUS

## 2016-02-28 MED ORDER — PROPOFOL 500 MG/50ML IV EMUL
INTRAVENOUS | Status: DC | PRN
  Administered 2016-02-28: 100 ug/kg/min via INTRAVENOUS

## 2016-02-28 MED ORDER — CLINDAMYCIN PHOSPHATE 900 MG/50ML IV SOLN
INTRAVENOUS | Status: AC
  Filled 2016-02-28: qty 50

## 2016-02-28 MED ORDER — BUPIVACAINE IN DEXTROSE 0.75-8.25 % IT SOLN
INTRATHECAL | Status: DC | PRN
  Administered 2016-02-28: 2 mL via INTRATHECAL

## 2016-02-28 MED ORDER — SERTRALINE HCL 50 MG PO TABS
50.0000 mg | ORAL_TABLET | Freq: Every day | ORAL | Status: DC
  Administered 2016-02-29: 50 mg via ORAL
  Filled 2016-02-28 (×2): qty 1

## 2016-02-28 MED ORDER — CLINDAMYCIN PHOSPHATE 900 MG/50ML IV SOLN
900.0000 mg | INTRAVENOUS | Status: AC
  Administered 2016-02-28: 900 mg via INTRAVENOUS

## 2016-02-28 MED ORDER — DOCUSATE SODIUM 100 MG PO CAPS
100.0000 mg | ORAL_CAPSULE | Freq: Two times a day (BID) | ORAL | Status: DC
  Administered 2016-02-28 – 2016-02-29 (×2): 100 mg via ORAL
  Filled 2016-02-28 (×2): qty 1

## 2016-02-28 MED ORDER — ALUM & MAG HYDROXIDE-SIMETH 200-200-20 MG/5ML PO SUSP: 30.0000 mL | ORAL | Status: DC | PRN

## 2016-02-28 MED ORDER — PHENYLEPHRINE HCL 10 MG/ML IJ SOLN
INTRAMUSCULAR | Status: AC
  Filled 2016-02-28: qty 1

## 2016-02-28 MED ORDER — METOCLOPRAMIDE HCL 5 MG/ML IJ SOLN: 5.0000 mg | Freq: Three times a day (TID) | INTRAMUSCULAR | Status: DC | PRN

## 2016-02-28 MED ORDER — MIDAZOLAM HCL 5 MG/5ML IJ SOLN
INTRAMUSCULAR | Status: DC | PRN
  Administered 2016-02-28: 2 mg via INTRAVENOUS

## 2016-02-28 MED ORDER — METHOCARBAMOL 1000 MG/10ML IJ SOLN
500.0000 mg | Freq: Four times a day (QID) | INTRAMUSCULAR | Status: DC | PRN
  Filled 2016-02-28: qty 5

## 2016-02-28 MED ORDER — MENTHOL 3 MG MT LOZG: 1.0000 | LOZENGE | OROMUCOSAL | Status: DC | PRN

## 2016-02-28 MED ORDER — METOCLOPRAMIDE HCL 5 MG PO TABS: 5.0000 mg | ORAL_TABLET | Freq: Three times a day (TID) | ORAL | Status: DC | PRN

## 2016-02-28 MED ORDER — DEXAMETHASONE SODIUM PHOSPHATE 10 MG/ML IJ SOLN
INTRAMUSCULAR | Status: DC | PRN
  Administered 2016-02-28: 10 mg via INTRAVENOUS

## 2016-02-28 MED ORDER — PROPOFOL 10 MG/ML IV BOLUS
INTRAVENOUS | Status: AC
  Filled 2016-02-28: qty 20

## 2016-02-28 MED ORDER — ONDANSETRON HCL 4 MG/2ML IJ SOLN
INTRAMUSCULAR | Status: AC
  Filled 2016-02-28: qty 2

## 2016-02-28 MED ORDER — ACETAMINOPHEN 650 MG RE SUPP: 650.0000 mg | Freq: Four times a day (QID) | RECTAL | Status: DC | PRN

## 2016-02-28 MED ORDER — FENTANYL CITRATE (PF) 100 MCG/2ML IJ SOLN
INTRAMUSCULAR | Status: AC
  Filled 2016-02-28: qty 2

## 2016-02-28 MED ORDER — SODIUM CHLORIDE 0.9 % IR SOLN
Status: DC | PRN
  Administered 2016-02-28: 2000 mL

## 2016-02-28 MED ORDER — PROPOFOL 10 MG/ML IV BOLUS
INTRAVENOUS | Status: AC
Start: 2016-02-28 — End: 2016-02-28
  Filled 2016-02-28: qty 60

## 2016-02-28 MED ORDER — MIDAZOLAM HCL 2 MG/2ML IJ SOLN
INTRAMUSCULAR | Status: AC
  Filled 2016-02-28: qty 2

## 2016-02-28 MED ORDER — HYDROMORPHONE HCL 1 MG/ML IJ SOLN
1.0000 mg | INTRAMUSCULAR | Status: DC | PRN
  Administered 2016-02-28: 1 mg via INTRAVENOUS
  Filled 2016-02-28: qty 1

## 2016-02-28 MED ORDER — ONDANSETRON HCL 4 MG PO TABS: 4.0000 mg | ORAL_TABLET | Freq: Four times a day (QID) | ORAL | Status: DC | PRN

## 2016-02-28 MED ORDER — DEXAMETHASONE SODIUM PHOSPHATE 10 MG/ML IJ SOLN
INTRAMUSCULAR | Status: AC
  Filled 2016-02-28: qty 1

## 2016-02-28 MED ORDER — KETOROLAC TROMETHAMINE 15 MG/ML IJ SOLN
7.5000 mg | Freq: Four times a day (QID) | INTRAMUSCULAR | Status: AC
  Administered 2016-02-28 – 2016-02-29 (×4): 7.5 mg via INTRAVENOUS
  Filled 2016-02-28 (×4): qty 1

## 2016-02-28 MED ORDER — ONDANSETRON HCL 4 MG/2ML IJ SOLN
INTRAMUSCULAR | Status: DC | PRN
  Administered 2016-02-28: 4 mg via INTRAVENOUS

## 2016-02-28 MED ORDER — IRBESARTAN 150 MG PO TABS
300.0000 mg | ORAL_TABLET | Freq: Every day | ORAL | Status: DC
  Administered 2016-02-28 – 2016-02-29 (×2): 300 mg via ORAL
  Filled 2016-02-28 (×2): qty 2

## 2016-02-28 MED ORDER — SODIUM CHLORIDE 0.9 % IV SOLN
INTRAVENOUS | Status: DC
  Administered 2016-02-29: 06:00:00 via INTRAVENOUS

## 2016-02-28 MED ORDER — OXYCODONE HCL 5 MG PO TABS
5.0000 mg | ORAL_TABLET | ORAL | Status: DC | PRN
  Administered 2016-02-28 – 2016-02-29 (×7): 10 mg via ORAL
  Filled 2016-02-28 (×7): qty 2

## 2016-02-28 MED ORDER — DIPHENHYDRAMINE HCL 12.5 MG/5ML PO ELIX: 12.5000 mg | ORAL_SOLUTION | ORAL | Status: DC | PRN

## 2016-02-28 MED ORDER — PROPOFOL 10 MG/ML IV BOLUS
INTRAVENOUS | Status: DC | PRN
Start: 2016-02-28 — End: 2016-02-28
  Administered 2016-02-28: 20 mg via INTRAVENOUS

## 2016-02-28 MED ORDER — CHLORHEXIDINE GLUCONATE 4 % EX LIQD: 60.0000 mL | Freq: Once | CUTANEOUS | Status: DC

## 2016-02-28 MED ORDER — CLINDAMYCIN PHOSPHATE 600 MG/50ML IV SOLN
600.0000 mg | Freq: Four times a day (QID) | INTRAVENOUS | Status: AC
  Administered 2016-02-28 (×2): 600 mg via INTRAVENOUS
  Filled 2016-02-28 (×2): qty 50

## 2016-02-28 MED ORDER — HYDROCHLOROTHIAZIDE 25 MG PO TABS
25.0000 mg | ORAL_TABLET | Freq: Every day | ORAL | Status: DC
  Administered 2016-02-28 – 2016-02-29 (×2): 25 mg via ORAL
  Filled 2016-02-28 (×2): qty 1

## 2016-02-28 MED ORDER — OLMESARTAN MEDOXOMIL-HCTZ 40-25 MG PO TABS: 1.0000 | ORAL_TABLET | Freq: Every day | ORAL | Status: DC

## 2016-02-28 MED ORDER — FENTANYL CITRATE (PF) 100 MCG/2ML IJ SOLN
INTRAMUSCULAR | Status: DC | PRN
  Administered 2016-02-28 (×2): 50 ug via INTRAVENOUS

## 2016-02-28 MED ORDER — METHOCARBAMOL 500 MG PO TABS
500.0000 mg | ORAL_TABLET | Freq: Four times a day (QID) | ORAL | Status: DC | PRN
  Administered 2016-02-28: 500 mg via ORAL
  Filled 2016-02-28: qty 1

## 2016-02-28 MED ORDER — PHENYLEPHRINE HCL 10 MG/ML IJ SOLN
INTRAVENOUS | Status: DC | PRN
  Administered 2016-02-28: 35 ug/min via INTRAVENOUS

## 2016-02-28 MED ORDER — PHENOL 1.4 % MT LIQD: 1.0000 | OROMUCOSAL | Status: DC | PRN

## 2016-02-28 MED ORDER — 0.9 % SODIUM CHLORIDE (POUR BTL) OPTIME
TOPICAL | Status: DC | PRN
  Administered 2016-02-28: 1000 mL

## 2016-02-28 MED ORDER — EPINEPHRINE PF 1 MG/ML IJ SOLN
INTRAMUSCULAR | Status: AC
Start: 2016-02-28 — End: 2016-02-28
  Filled 2016-02-28: qty 1

## 2016-02-28 MED ORDER — LEVOTHYROXINE SODIUM 50 MCG PO TABS
175.0000 ug | ORAL_TABLET | Freq: Every day | ORAL | Status: DC
  Administered 2016-02-29: 175 ug via ORAL
  Filled 2016-02-28: qty 1

## 2016-02-28 MED ORDER — ASPIRIN 81 MG PO CHEW
81.0000 mg | CHEWABLE_TABLET | Freq: Two times a day (BID) | ORAL | Status: DC
  Administered 2016-02-28 – 2016-02-29 (×2): 81 mg via ORAL
  Filled 2016-02-28 (×2): qty 1

## 2016-02-28 SURGICAL SUPPLY — 43 items
APL SKNCLS STERI-STRIP NONHPOA (GAUZE/BANDAGES/DRESSINGS) ×1
BAG SPEC THK2 15X12 ZIP CLS (MISCELLANEOUS)
BAG ZIPLOCK 12X15 (MISCELLANEOUS) IMPLANT
BANDAGE ACE 6X5 VEL STRL LF (GAUZE/BANDAGES/DRESSINGS) ×2 IMPLANT
BENZOIN TINCTURE PRP APPL 2/3 (GAUZE/BANDAGES/DRESSINGS) ×2 IMPLANT
BLADE SAW RECIPROCATING 77.5 (BLADE) ×2 IMPLANT
BLADE SAW SAG 13X.89X90 PERFRM (BLADE) ×2 IMPLANT
BLADE SAW STABLE CUT 109 (BLADE) ×1 IMPLANT
BONE CEMENT PALACOSE (Cement) ×2 IMPLANT
BOWL SMART MIX CTS (DISPOSABLE) ×2 IMPLANT
CAPT KNEE PARTIAL 2 ×1 IMPLANT
CEMENT BONE PALACOSE (Cement) ×1 IMPLANT
CLOTH BEACON ORANGE TIMEOUT ST (SAFETY) ×2 IMPLANT
CUFF TOURN SGL QUICK 34 (TOURNIQUET CUFF) ×2
CUFF TRNQT CYL 34X4X40X1 (TOURNIQUET CUFF) ×1 IMPLANT
DRAPE U-SHAPE 47X51 STRL (DRAPES) ×2 IMPLANT
DRSG AQUACEL AG ADV 3.5X10 (GAUZE/BANDAGES/DRESSINGS) IMPLANT
DRSG PAD ABDOMINAL 8X10 ST (GAUZE/BANDAGES/DRESSINGS) ×3 IMPLANT
DURAPREP 26ML APPLICATOR (WOUND CARE) ×2 IMPLANT
ELECT REM PT RETURN 9FT ADLT (ELECTROSURGICAL) ×2
ELECTRODE REM PT RTRN 9FT ADLT (ELECTROSURGICAL) ×1 IMPLANT
GAUZE SPONGE 4X4 12PLY STRL (GAUZE/BANDAGES/DRESSINGS) ×2 IMPLANT
GAUZE XEROFORM 1X8 LF (GAUZE/BANDAGES/DRESSINGS) IMPLANT
GLOVE BIO SURGEON STRL SZ7.5 (GLOVE) ×2 IMPLANT
GLOVE BIOGEL PI IND STRL 8 (GLOVE) ×2 IMPLANT
GLOVE BIOGEL PI INDICATOR 8 (GLOVE) ×2
GLOVE ECLIPSE 8.0 STRL XLNG CF (GLOVE) ×2 IMPLANT
GOWN STRL REUS W/TWL XL LVL3 (GOWN DISPOSABLE) ×4 IMPLANT
HANDPIECE INTERPULSE COAX TIP (DISPOSABLE) ×2
LEGGING LITHOTOMY PAIR STRL (DRAPES) ×2 IMPLANT
MANIFOLD NEPTUNE II (INSTRUMENTS) ×2 IMPLANT
PACK TOTAL KNEE CUSTOM (KITS) ×2 IMPLANT
PADDING CAST COTTON 6X4 STRL (CAST SUPPLIES) ×2 IMPLANT
SET HNDPC FAN SPRY TIP SCT (DISPOSABLE) ×1 IMPLANT
STAPLER VISISTAT 35W (STAPLE) IMPLANT
STRIP CLOSURE SKIN 1/2X4 (GAUZE/BANDAGES/DRESSINGS) ×2 IMPLANT
SUT MNCRL AB 4-0 PS2 18 (SUTURE) ×1 IMPLANT
SUT VIC AB 0 CT1 36 (SUTURE) ×1 IMPLANT
SUT VIC AB 1 CT1 36 (SUTURE) ×2 IMPLANT
SUT VIC AB 2-0 CT1 27 (SUTURE) ×4
SUT VIC AB 2-0 CT1 TAPERPNT 27 (SUTURE) ×2 IMPLANT
TRAY FOLEY W/METER SILVER 16FR (SET/KITS/TRAYS/PACK) ×1 IMPLANT
WRAP KNEE MAXI GEL POST OP (GAUZE/BANDAGES/DRESSINGS) ×2 IMPLANT

## 2016-02-28 NOTE — Op Note (Signed)
NAMEJASKIRAT, [name]             ACCOUNT NO.:  [id number]  MEDICAL RECORD NO.:  [id number]  LOCATION:                                 FACILITY:  PHYSICIAN:  Lind Guest. Ninfa Linden, M.D.DATE OF BIRTH:  Sep 11, 1950  DATE OF PROCEDURE:  02/28/2016 DATE OF DISCHARGE:                              OPERATIVE REPORT   PREOPERATIVE DIAGNOSIS:  Left knee medial compartment severe osteoarthritis.  POSTOPERATIVE DIAGNOSIS:  Left knee medial compartment severe osteoarthritis.  PROCEDURE:  Left unicompartmental/partial knee arthroplasty.  IMPLANTS:  Biomet Oxford mobile bearing partial knee replacement with size medium femur, size D tibial tray, 5 mm mobile bearing polyethylene insert.  SURGEON:  Lind Guest. Ninfa Linden, M.D.  ASSISTANT:  Erskine Emery, PA-C.  ANESTHESIA: 1. Left lower extremity adductor canal block. 2. Spinal.  BLOOD LOSS:  Less than 100 mL.  TOURNIQUET TIME:  Under an hour and a half.  COMPLICATIONS:  None.  ANTIBIOTICS:  900 mg of IV clindamycin.  INDICATIONS:  [name] is a 66 year old active gentleman well-known to me.  He has mainly medial compartment arthritis involving his left knee.  This has become debilitating for him.  He has tried and failed all forms of conservative treatment, even an arthroscopic intervention that showed mainly medial compartment arthritis.  His lateral compartment and ACL were pristine.  His patellofemoral joint had some mild arthritic changes, but he is asymptomatic from this.  We talked about the recommendation of a partial knee arthroplasty.  He was definitely in favor of this.  We had a thorough discussion of risks and benefits of this as well and he did wish to proceed with surgery.  PROCEDURE DESCRIPTION:  After informed consent was obtained, appropriate left knee was marked.  Anesthesia was obtained with an adductor canal block in the holding room and he was brought to the operating room, sat up on the operating  table and spinal anesthesia was obtained.  He was then laid in a supine position.  A nonsterile tourniquet was placed around his upper left thigh and his left leg was prepped and draped from the thigh down the ankle with DuraPrep and sterile drapes including sterile stockinette.  With the bed raised, and his right non-operative leg in a well leg holder, a proper padding, time-out was called and he was identified as correct patient, correct left knee.  We then used an Esmarch to wrap out the leg and tourniquet was inflated to 300 mm of pressure.  We then made a medial incision just medial to the patella at the superior pole and carried this down to the medial tibial tubercle. We found moderate joint effusion as we opened up the joint capsule.  I was then able to assess the joint itself and found the ACL and PCL to be intact.  The lateral compartment had no arthritic changes.  There was severe arthritis in the medial compartment and just mild at the medial trochlear groove.  We then felt comfortable proceeding with the partial knee replacement.  We first removed remnants of the medial meniscus.  We used our sizing spoon and chose a size medium femur.  Based off the sizing spoon on the femur, we then were able  to set our tibial cutting guide referencing off the femur in correcting for neutral slope and correcting for varus and valgus.  We made this cut without difficulty. I went to the femur.  I then used an intramedullary guide for the femur for setting our femoral cuts.  We were able to draw a line down the middle of the medial femoral condyle.  We then put our distal femoral cutting guide, made our distal femoral cut without difficulty.  Based off our tibia cut, we chose a size D tibia tray for the left knee.  We then used a 0-spigot to mill the femur with the medium femur and the D tibia in place.  We trialed a 3 polyethylene insert, then went up to a 4.  With that being said, then we  prepared the femur based off 3 spigot. We milled down to this and then trialed again and we were pleased with the flexion and extension gaps with a size 4 polyethylene insert, but we felt like we could likely go up to 5 with stretching of his ligaments once we cleaned more debris from the knee.  We then did our finishing cuts on the femur and then went to the tibia and did our keel cut based off the D tibia.  Once we cleaned debris from the knee, we irrigated the knee with normal saline solution.  We mixed our cement and cemented the real size D left tibial tray followed by the real size medium femur. Once we put our real Biomet Oxford femoral and tibial components in, we pressurized this with the knee at a 45 degree flexed position with the size 4 paddle in place.  Once the cement had hardened, we then removed the paddle and removed the cement debris from the knee and then irrigated the knee again.  I went with the real 5 polyethylene insert and inserted that without difficulty and we were pleased with stability and range of motion of the knee.  We then irrigated the knee again with normal saline solution using pulsatile lavage.  We closed the arthrotomy with interrupted #1 Vicryl suture followed by 0-Vicryl in the deep tissue, 2-0 Vicryl in the subcutaneous tissue, 4-0 Monocryl subcuticular stitch.  Steri-Strips on the skin.  Well-padded sterile dressing was applied.  An in and out catheter was performed and he was taken to the recovery room in stable condition.  All final counts were correct. There were no complications noted.  Of note, Erskine Emery, PA-C assisted in the entire case.  His assistance was crucial for facilitating all aspects of this case.     Lind Guest. Ninfa Linden, M.D.   ______________________________ Lind Guest. Ninfa Linden, M.D.    CYB/MEDQ  D:  02/28/2016  T:  02/28/2016  Job:  [id number]

## 2016-02-28 NOTE — Anesthesia Procedure Notes (Signed)
Spinal  Patient location during procedure: OR End time: 02/28/2016 7:37 AM Staffing Resident/CRNA: Enrigue Catena E Performed: anesthesiologist  Preanesthetic Checklist Completed: patient identified, site marked, surgical consent, pre-op evaluation, timeout performed, IV checked, risks and benefits discussed and monitors and equipment checked Spinal Block Patient position: sitting Prep: DuraPrep Patient monitoring: heart rate, continuous pulse ox and blood pressure Approach: left paramedian Location: L3-4 Injection technique: single-shot Needle Needle type: Pencan  Needle gauge: 24 G Needle length: 9 cm Assessment Sensory level: T6 Additional Notes Expiration date of kit checked and confirmed. Patient tolerated procedure well, without complications.

## 2016-02-28 NOTE — H&P (Signed)
TOTAL KNEE ADMISSION H&P  Patient is being admitted for left partial knee arthroplasty.  Subjective:  Chief Complaint:left knee pain.  HPI: [name], 66 y.o. male, has a history of pain and functional disability in the left knee due to arthritis and has failed non-surgical conservative treatments for greater than 12 weeks to includeNSAID's and/or analgesics, corticosteriod injections, viscosupplementation injections, flexibility and strengthening excercises, weight reduction as appropriate and activity modification.  Onset of symptoms was gradual, starting 3 years ago with gradually worsening course since that time. The patient noted no past surgery on the left knee(s).  Patient currently rates pain in the left knee(s) at 9 out of 10 with activity. Patient has night pain, worsening of pain with activity and weight bearing, pain that interferes with activities of daily living, pain with passive range of motion, crepitus and joint swelling.  Patient has evidence of subchondral sclerosis, periarticular osteophytes and joint space narrowing by imaging studies. There is no active infection.  Patient Active Problem List   Diagnosis Date Noted  . Unilateral primary osteoarthritis, left knee 02/28/2016  . Chest pain with moderate risk for cardiac etiology 06/19/2013  . HTN (hypertension) 06/19/2013  . HLD (hyperlipidemia) 06/19/2013  . Obesity 06/19/2013   Past Medical History:  Diagnosis Date  . A-fib ([place]) 2007   one occurrence , no issues following thryorid procedure   . Heart murmur    since age 58  . Hyperlipidemia   . Hypertension   . Hypothyroidism   . OSA on CPAP   . Thyroid disease    hyper "zapped"    Past Surgical History:  Procedure Laterality Date  . APPENDECTOMY    . KNEE SURGERY    . ligament stapled   1980s    Prescriptions Prior to Admission  Medication Sig Dispense Refill Last Dose  . levothyroxine (SYNTHROID, LEVOTHROID) 175 MCG tablet Take 175 mcg by mouth  daily before breakfast.    02/28/2016 at 0400  . olmesartan-hydrochlorothiazide (BENICAR HCT) 40-25 MG tablet Take 1 tablet by mouth daily.   02/27/2016 at Unknown time  . OVER THE COUNTER MEDICATION Take 1 capsule by mouth daily. Zyflamend Whole Body (10-herb blend for pain relief/inflammation--Rosemary,Turmeric, Green Tea, Ginseng, Chinese Skullcap, [place], [place], [place], holly basil, organic oregano)   02/24/2016  . sertraline (ZOLOFT) 100 MG tablet Take 50 mg by mouth daily.    02/27/2016 at Unknown time   Allergies  Allergen Reactions  . Penicillins Anaphylaxis    Pt reports throat swelling, difficulty breathing at age 46 Has patient had a PCN reaction causing immediate rash, facial/tongue/throat swelling, SOB or lightheadedness with hypotension: yes Has patient had a PCN reaction causing severe rash involving mucus membranes or skin necrosis: no Has patient had a PCN reaction that required hospitalization no Has patient had a PCN reaction occurring within the last 10 years: no If all of the above answers are "NO", then may proceed with Cephalosporin use.     Social History  Substance Use Topics  . Smoking status: Former Smoker    Types: Pipe  . Smokeless tobacco: Never Used     Comment: quit 40 years ago  . Alcohol use Yes     Comment: rarely , glass of wine once a year    Family History  Problem Relation Age of Onset  . Heart failure Father   . Heart disease Father   . Stroke Maternal Grandfather   . Heart disease Paternal Grandfather  Review of Systems  Musculoskeletal: Positive for joint pain.  All other systems reviewed and are negative.   Objective:  Physical Exam  Constitutional: He is oriented to person, place, and time. He appears well-developed and well-nourished.  HENT:  Head: Normocephalic and atraumatic.  Eyes: EOM are normal. Pupils are equal, round, and reactive to light.  Neck: Normal range of motion. Neck supple.  Cardiovascular:  Normal rate and regular rhythm.   Respiratory: Effort normal and breath sounds normal.  GI: Soft. Bowel sounds are normal.  Musculoskeletal:       Left knee: He exhibits decreased range of motion, effusion and abnormal alignment. Tenderness found. Medial joint line tenderness noted.  Neurological: He is alert and oriented to person, place, and time.  Skin: Skin is warm and dry.  Psychiatric: He has a normal mood and affect.    Vital signs in last 24 hours: Temp:  [98.3 F (36.8 C)] 98.3 F (36.8 C) (01/19 0530) Pulse Rate:  [55] 55 (01/19 0530) Resp:  [16] 16 (01/19 0530) BP: (144)/(87) 144/87 (01/19 0530) SpO2:  [98 %] 98 % (01/19 0530) Weight:  [260 lb 11.2 oz (118.3 kg)] 260 lb 11.2 oz (118.3 kg) (01/19 0541)  Labs:   Estimated body mass index is 37.95 kg/m as calculated from the following:   Height as of this encounter: 5' 9.5" (1.765 m).   Weight as of this encounter: 260 lb 11.2 oz (118.3 kg).   Imaging Review Plain radiographs demonstrate severe degenerative joint disease of the left knee medial compartment. The overall alignment ismild varus. The bone quality appears to be excellent for age and reported activity level.  Assessment/Plan:  End stage arthritis, left knee medial compartment  The patient history, physical examination, clinical judgment of the provider and imaging studies are consistent with end stage degenerative joint disease of the left knee(s) and partial knee arthroplasty is deemed medically necessary. The treatment options including medical management, injection therapy arthroscopy and arthroplasty were discussed at length. The risks and benefits of partial knee arthroplasty were presented and reviewed. The risks due to aseptic loosening, infection, stiffness, patella tracking problems, thromboembolic complications and other imponderables were discussed. The patient acknowledged the explanation, agreed to proceed with the plan and consent was signed.  Patient is being admitted for inpatient treatment for surgery, pain control, PT, OT, prophylactic antibiotics, VTE prophylaxis, progressive ambulation and ADL's and discharge planning. The patient is planning to be discharged home with home health services

## 2016-02-28 NOTE — Transfer of Care (Signed)
Immediate Anesthesia Transfer of Care Note  Patient: [name]  Procedure(s) Performed: Procedure(s): LEFT UNICOMPARTMENTAL KNEE ARTHROPLASTY (Left)  Patient Location: PACU  Anesthesia Type:Spinal  Level of Consciousness: awake, alert , oriented and patient cooperative  Airway & Oxygen Therapy: Patient Spontanous Breathing and Patient connected to face mask oxygen  Post-op Assessment: Report given to RN and Post -op Vital signs reviewed and stable  Post vital signs: stable  Last Vitals:  Vitals:   02/28/16 0530 02/28/16 0936  BP: (!) 144/87 (P) 119/65  Pulse: (!) 55 (P) 65  Resp: 16 (P) 16  Temp: 36.8 C (P) 36.7 C    Last Pain:  Vitals:   02/28/16 0936  TempSrc:   PainSc: (P) Asleep      Patients Stated Pain Goal: 3 ([phone] [id number])  Complications: No apparent anesthesia complications

## 2016-02-28 NOTE — Evaluation (Signed)
Physical Therapy Evaluation Patient Details Name: [name] MRN: [id number] DOB: Jun 23, 1950 Today's Date: 02/28/2016   History of Present Illness  Pt s/p L UKR  Clinical Impression  Pt s/p L UKR presents with decreased L LE strength/ROM and post op pain limiting functional mobility.  Pt should progress to dc home with assist of family.    Follow Up Recommendations Home health PT    Equipment Recommendations  Rolling walker with 5" wheels    Recommendations for Other Services OT consult     Precautions / Restrictions Precautions Precautions: Fall Restrictions Weight Bearing Restrictions: No Other Position/Activity Restrictions: WBAT      Mobility  Bed Mobility Overal bed mobility: Needs Assistance Bed Mobility: Supine to Sit     Supine to sit: Min guard     General bed mobility comments: cues for sequence and use of R LE to self assist  Transfers Overall transfer level: Needs assistance Equipment used: Rolling walker (2 wheeled) Transfers: Sit to/from Stand Sit to Stand: Min guard         General transfer comment: cues for LE management and use of UEs to self assist  Ambulation/Gait Ambulation/Gait assistance: Min assist Ambulation Distance (Feet): 58 Feet Assistive device: Rolling walker (2 wheeled) Gait Pattern/deviations: Step-to pattern;Decreased step length - right;Decreased step length - left;Shuffle;Trunk flexed Gait velocity: decr Gait velocity interpretation: Below normal speed for age/gender General Gait Details: cues for sequence, posture and position from [employer]            Wheelchair Mobility    Modified Rankin (Stroke Patients Only)       Balance Overall balance assessment: No apparent balance deficits (not formally assessed)                                           Pertinent Vitals/Pain Pain Assessment: 0-10 Pain Score: 3  Pain Location: L knee Pain Descriptors / Indicators: Aching;Sore Pain  Intervention(s): Limited activity within patient's tolerance;Monitored during session;Premedicated before session;Ice applied    Home Living Family/patient expects to be discharged to:: Private residence Living Arrangements: Spouse/significant other Available Help at Discharge: Family Type of Home: House Home Access: Stairs to enter Entrance Stairs-Rails: Right;Left;Can reach both Technical brewer of Steps: 4 Home Layout: Two level Home Equipment: Crutches      Prior Function Level of Independence: Independent               Hand Dominance        Extremity/Trunk Assessment   Upper Extremity Assessment Upper Extremity Assessment: Overall WFL for tasks assessed    Lower Extremity Assessment Lower Extremity Assessment: LLE deficits/detail    Cervical / Trunk Assessment Cervical / Trunk Assessment: Normal  Communication   Communication: No difficulties  Cognition Arousal/Alertness: Awake/alert Behavior During Therapy: WFL for tasks assessed/performed Overall Cognitive Status: Within Functional Limits for tasks assessed                      General Comments      Exercises Total Joint Exercises Ankle Circles/Pumps: AROM;Both;15 reps;Supine   Assessment/Plan    PT Assessment Patient needs continued PT services  PT Problem List Decreased strength;Decreased range of motion;Decreased activity tolerance;Decreased mobility;Decreased knowledge of use of DME;Pain;Obesity          PT Treatment Interventions DME instruction;Gait training;Stair training;Functional mobility training;Therapeutic activities;Therapeutic exercise;Patient/family education  PT Goals (Current goals can be found in the Care Plan section)  Acute Rehab PT Goals Patient Stated Goal: Regain IND PT Goal Formulation: With patient Time For Goal Achievement: 03/03/16 Potential to Achieve Goals: Good    Frequency 7X/week   Barriers to discharge        Co-evaluation                End of Session Equipment Utilized During Treatment: Gait belt Activity Tolerance: Patient tolerated treatment well Patient left: in chair;with call bell/phone within reach;with family/visitor present Nurse Communication: Mobility status    Functional Assessment Tool Used: Clinical judgement Functional Limitation: Mobility: Walking and moving around Mobility: Walking and Moving Around Current Status [id number]): At least 20 percent but less than 40 percent impaired, limited or restricted Mobility: Walking and Moving Around Goal Status [phone]): At least 1 percent but less than 20 percent impaired, limited or restricted    Time: 1445-1515 PT Time Calculation (min) (ACUTE ONLY): 30 min   Charges:   PT Evaluation $PT Eval Low Complexity: 1 Procedure PT Treatments $Gait Training: 8-22 mins   PT G Codes:   PT G-Codes **NOT FOR INPATIENT CLASS** Functional Assessment Tool Used: Clinical judgement Functional Limitation: Mobility: Walking and moving around Mobility: Walking and Moving Around Current Status [id number]): At least 20 percent but less than 40 percent impaired, limited or restricted Mobility: Walking and Moving Around Goal Status [phone]): At least 1 percent but less than 20 percent impaired, limited or restricted    [name] 02/28/2016, 4:30 PM

## 2016-02-28 NOTE — Anesthesia Postprocedure Evaluation (Addendum)
Anesthesia Post Note  Patient: [name]  Procedure(s) Performed: Procedure(s) (LRB): LEFT UNICOMPARTMENTAL KNEE ARTHROPLASTY (Left)  Patient location during evaluation: PACU Anesthesia Type: MAC and Spinal Level of consciousness: awake, awake and alert and oriented Pain management: pain level controlled Vital Signs Assessment: post-procedure vital signs reviewed and stable Respiratory status: spontaneous breathing, nonlabored ventilation and respiratory function stable Cardiovascular status: blood pressure returned to baseline Postop Assessment: no headache Anesthetic complications: no       Last Vitals:  Vitals:   02/28/16 1248 02/28/16 1353  BP: (!) 144/72 130/77  Pulse: 66 73  Resp: 16 18  Temp: 36.3 C 36.9 C    Last Pain:  Vitals:   02/28/16 1419  TempSrc:   PainSc: 2                  Darriona Dehaas COKER

## 2016-02-28 NOTE — Brief Op Note (Signed)
02/28/2016  9:12 AM  PATIENT:  [name]  66 y.o. male  PRE-OPERATIVE DIAGNOSIS:  medial compartment arthritis left knee  POST-OPERATIVE DIAGNOSIS:  medial compartment arthritis left knee  PROCEDURE:  Procedure(s): LEFT UNICOMPARTMENTAL KNEE ARTHROPLASTY (Left)  SURGEON:  Surgeon(s) and Role:    * Mcarthur Rossetti, MD - Primary  PHYSICIAN ASSISTANT: Benita Stabile, PA-C  ANESTHESIA:   regional and spinal  EBL:  Total I/O In: 1000 [I.V.:1000] Out: -   COUNTS:  YES  TOURNIQUET:  * Missing tourniquet times found for documented tourniquets in log:  [id number] *  DICTATION: .Other Dictation: Dictation Number [id number]  PLAN OF CARE: Admit for overnight observation  PATIENT DISPOSITION:  PACU - hemodynamically stable.   Delay start of Pharmacological VTE agent (>24hrs) due to surgical blood loss or risk of bleeding: no

## 2016-02-28 NOTE — Anesthesia Preprocedure Evaluation (Signed)
Anesthesia Evaluation  Patient identified by MRN, date of birth, ID band Patient awake    Reviewed: Allergy & Precautions, NPO status , Patient's Chart, lab work & pertinent test results  Airway Mallampati: II   Neck ROM: Full    Dental  (+) Teeth Intact, Dental Advisory Given   Pulmonary former smoker,    breath sounds clear to auscultation       Cardiovascular hypertension,  Rhythm:Regular Rate:Normal     Neuro/Psych    GI/Hepatic   Endo/Other    Renal/GU      Musculoskeletal   Abdominal (+) + obese,   Peds  Hematology   Anesthesia Other Findings   Reproductive/Obstetrics                            Anesthesia Physical Anesthesia Plan  ASA: III  Anesthesia Plan: Spinal and MAC   Post-op Pain Management:    Induction: Intravenous  Airway Management Planned: Simple Face Mask and Natural Airway  Additional Equipment:   Intra-op Plan:   Post-operative Plan:   Informed Consent: I have reviewed the patients History and Physical, chart, labs and discussed the procedure including the risks, benefits and alternatives for the proposed anesthesia with the patient or authorized representative who has indicated his/her understanding and acceptance.   Dental advisory given  Plan Discussed with: CRNA and Anesthesiologist  Anesthesia Plan Comments:        Anesthesia Quick Evaluation

## 2016-02-28 NOTE — Anesthesia Procedure Notes (Signed)
Anesthesia Regional Block:  Adductor canal block  Pre-Anesthetic Checklist: ,, timeout performed, Correct Patient, Correct Site, Correct Laterality, Correct Procedure, Correct Position, site marked, Risks and benefits discussed,  Surgical consent,  Pre-op evaluation,  At surgeon's request and post-op pain management  Laterality: Left  Prep: chloraprep       Needles:  Injection technique: Single-shot  Needle Type: Echogenic Stimulator Needle          Additional Needles: Adductor canal block Narrative:  Start time: 02/28/2016 7:15 AM End time: 02/28/2016 7:20 AM Injection made incrementally with aspirations every 5 mL.  Performed by: Personally   Additional Notes: 30 cc 0.5% Bupivacaine injected easily

## 2016-02-29 DIAGNOSIS — M1712 Unilateral primary osteoarthritis, left knee: Secondary | ICD-10-CM | POA: Diagnosis not present

## 2016-02-29 MED ORDER — METHOCARBAMOL 500 MG PO TABS: 500.0000 mg | ORAL_TABLET | Freq: Four times a day (QID) | ORAL | 0 refills | Status: DC | PRN

## 2016-02-29 MED ORDER — OXYCODONE-ACETAMINOPHEN 5-325 MG PO TABS: 1.0000 | ORAL_TABLET | ORAL | 0 refills | Status: DC | PRN

## 2016-02-29 MED ORDER — ASPIRIN 81 MG PO CHEW: 81.0000 mg | CHEWABLE_TABLET | Freq: Two times a day (BID) | ORAL | 0 refills | Status: DC

## 2016-02-29 NOTE — Progress Notes (Signed)
Physical Therapy Treatment Patient Details Name: [name] MRN: [id number] DOB: 1950/08/02 Today's Date: 02/29/2016    History of Present Illness Pt s/p L UKR    PT Comments    Pt progressing well with mobility and eager for dc home.  Reviewed therex and stairs with pt and spouse.  Follow Up Recommendations  Home health PT     Equipment Recommendations  Rolling walker with 5" wheels    Recommendations for Other Services OT consult     Precautions / Restrictions Precautions Precautions: Fall Restrictions Weight Bearing Restrictions: No Other Position/Activity Restrictions: WBAT    Mobility  Bed Mobility Overal bed mobility: Modified Independent             General bed mobility comments: OOB and reports no difficulty with bed mobility  Transfers Overall transfer level: Needs assistance Equipment used: Rolling walker (2 wheeled) Transfers: Sit to/from Stand Sit to Stand: Supervision         General transfer comment: cues for LE management and use of UEs to self assist  Ambulation/Gait Ambulation/Gait assistance: Supervision Ambulation Distance (Feet): 200 Feet Assistive device: Rolling walker (2 wheeled) Gait Pattern/deviations: Step-to pattern;Step-through pattern;Decreased step length - right;Decreased step length - left;Shuffle;Trunk flexed Gait velocity: decr Gait velocity interpretation: Below normal speed for age/gender General Gait Details: min cues for sequence, posture and position from RW.     Stairs Stairs: Yes   Stair Management: No rails;One rail Right;Step to pattern;Forwards;With crutches Number of Stairs: 10 General stair comments: up/down 6 with rail and crutch and 4 with bilat crutches   Wheelchair Mobility    Modified Rankin (Stroke Patients Only)       Balance Overall balance assessment: No apparent balance deficits (not formally assessed)                                  Cognition Arousal/Alertness:  Awake/alert Behavior During Therapy: WFL for tasks assessed/performed Overall Cognitive Status: Within Functional Limits for tasks assessed                      Exercises Total Joint Exercises Ankle Circles/Pumps: AROM;Both;15 reps;Supine Quad Sets: AROM;Both;10 reps;Supine Heel Slides: AAROM;Left;Supine;5 reps Straight Leg Raises: AAROM;AROM;Left;Supine;10 reps Long Arc Quad: AAROM;AROM;Left;5 reps;Seated    General Comments        Pertinent Vitals/Pain Pain Assessment: Faces Pain Score: 4  Faces Pain Scale: Hurts a little bit Pain Location: L knee Pain Descriptors / Indicators: Aching;Sore Pain Intervention(s): Limited activity within patient's tolerance;Monitored during session;Premedicated before session;Ice applied    Home Living Family/patient expects to be discharged to:: Private residence Living Arrangements: Spouse/significant other Available Help at Discharge: Family;Available 24 hours/day Type of Home: House Home Access: Stairs to enter Entrance Stairs-Rails: Right;Left;Can reach both Home Layout: Two level Home Equipment: Crutches      Prior Function Level of Independence: Independent          PT Goals (current goals can now be found in the care plan section) Acute Rehab PT Goals Patient Stated Goal: Regain IND PT Goal Formulation: With patient Time For Goal Achievement: 03/03/16 Potential to Achieve Goals: Good Progress towards PT goals: Progressing toward goals    Frequency    7X/week      PT Plan Current plan remains appropriate    Co-evaluation             End of Session Equipment Utilized During Treatment: Gait  belt Activity Tolerance: Patient tolerated treatment well Patient left: in chair;with call bell/phone within reach;with family/visitor present     Time: [id number] PT Time Calculation (min) (ACUTE ONLY): 33 min  Charges:  $Gait Training: 8-22 mins $Therapeutic Exercise: 8-22 mins                    G Codes:       Ruairi Stutsman March 05, 2016, 12:39 PM

## 2016-02-29 NOTE — Care Management Note (Signed)
Case Management Note  Patient Details  Name: [name] MRN: [id number] Date of Birth: 17-Jun-1950  Subjective/Objective:   Status post left partial knee replacement                 Action/Plan: Discharge Planning: AVS reviewed:  NCM spoke to pt. Preoperatively arranged with [facility] for [place]. Contacted [facility] DME rep for RW for home. Pt states he also has crutches at home. Wife at home to assist with care.   PCP  Deland Pretty   Expected Discharge Date:  02/29/16               Expected Discharge Plan:  [place]  In-House Referral:  NA  Discharge planning Services  CM Consult  Post Acute Care Choice:  Home Health Choice offered to:  Patient  DME Arranged:  Walker rolling DME Agency:  [place]:  PT [place] Agency:  [facility] ([facility])  Status of Service:  Completed, signed off  If discussed at [employer] of Stay Meetings, dates discussed:    Additional Comments:  Erenest Rasher, RN 02/29/2016, 10:08 AM

## 2016-02-29 NOTE — Evaluation (Signed)
Occupational Therapy Evaluation and Discharge Patient Details Name: [name] MRN: [id number] DOB: 23-Jun-1950 Today's Date: 02/29/2016    History of Present Illness Pt s/p L UKR   Clinical Impression   Pt was independent prior to admission. Presents with mild L knee pain, impaired standing balance and difficulty with LB ADL. Educated pt in compensatory strategies for LB bathing and dressing and in use of AE. Pt verbalizing understanding of all information, will have 24 hour assist of his family upon d/c. No further OT needs.    Follow Up Recommendations  No OT follow up    Equipment Recommendations  None recommended by OT    Recommendations for Other Services       Precautions / Restrictions Precautions Precautions: Fall Restrictions Weight Bearing Restrictions: No Other Position/Activity Restrictions: WBAT      Mobility Bed Mobility Overal bed mobility: Modified Independent             General bed mobility comments: HOB up, pt managed blankets  Transfers Overall transfer level: Needs assistance Equipment used: Rolling walker (2 wheeled) Transfers: Sit to/from Stand Sit to Stand: Supervision         General transfer comment: pt somewhat impulsive, initially did not want to use walker    Balance                                            ADL Overall ADL's : Needs assistance/impaired Eating/Feeding: Independent;Sitting   Grooming: Wash/dry hands;Wash/dry face;Standing;Supervision/safety   Upper Body Bathing: Set up;Sitting   Lower Body Bathing: Minimal assistance;Sit to/from stand Lower Body Bathing Details (indicate cue type and reason): educated in use of long handled bath sponge Upper Body Dressing : Minimal assistance;Standing   Lower Body Dressing: Minimal assistance;Sit to/from stand Lower Body Dressing Details (indicate cue type and reason): can doff L sock, not don, will rely on wife, educated in use of AE Toilet  Transfer: Supervision/safety;RW;Ambulation   Toileting- Water quality scientist and Hygiene: Engineer, site Details (indicate cue type and reason): instructed in technique and to have family present for safety Functional mobility during ADLs: Supervision/safety;Rolling walker;Cueing for safety General ADL Comments: pt has no concerns about toilet transfers, declining a 3 in 1     Vision     Perception     Praxis      Pertinent Vitals/Pain Pain Assessment: Faces Faces Pain Scale: Hurts a little bit Pain Location: L knee Pain Descriptors / Indicators: Guarding Pain Intervention(s): Monitored during session;Premedicated before session     Hand Dominance Left   Extremity/Trunk Assessment Upper Extremity Assessment Upper Extremity Assessment: Overall WFL for tasks assessed   Lower Extremity Assessment Lower Extremity Assessment: Defer to PT evaluation   Cervical / Trunk Assessment Cervical / Trunk Assessment: Normal   Communication Communication Communication: No difficulties   Cognition Arousal/Alertness: Awake/alert Behavior During Therapy: WFL for tasks assessed/performed Overall Cognitive Status: Within Functional Limits for tasks assessed                     General Comments       Exercises       Shoulder Instructions      Home Living Family/patient expects to be discharged to:: Private residence Living Arrangements: Spouse/significant other Available Help at Discharge: Family;Available 24 hours/day Type of Home: House Home Access: Stairs to enter Entrance  Stairs-Number of Steps: 4 Entrance Stairs-Rails: Right;Left;Can reach both Home Layout: Two level Alternate Level Stairs-Number of Steps: 15 Alternate Level Stairs-Rails: Right Bathroom Shower/Tub: Occupational psychologist: Standard     Home Equipment: Crutches          Prior Functioning/Environment Level of Independence: Independent                  OT Problem List: Pain;Impaired balance (sitting and/or standing)   OT Treatment/Interventions:      OT Goals(Current goals can be found in the care plan section) Acute Rehab OT Goals Patient Stated Goal: Regain IND  OT Frequency:     Barriers to D/C:            Co-evaluation              End of Session Equipment Utilized During Treatment: Gait belt;Rolling walker  Activity Tolerance: Patient tolerated treatment well Patient left: in chair;with call bell/phone within reach   Time: 0843-0909 OT Time Calculation (min): 26 min Charges:  OT General Charges $OT Visit: 1 Procedure OT Evaluation $OT Eval Low Complexity: 1 Procedure OT Treatments $Self Care/Home Management : 8-22 mins G-Codes: OT G-codes **NOT FOR INPATIENT CLASS** Functional Assessment Tool Used: clinical judgement Functional Limitation: Self care Self Care Current Status [id number]): At least 1 percent but less than 20 percent impaired, limited or restricted Self Care Discharge Status [phone]): At least 1 percent but less than 20 percent impaired, limited or restricted  Malka So 02/29/2016, 9:16 AM  [phone]

## 2016-02-29 NOTE — Progress Notes (Signed)
Physical Therapy Treatment Patient Details Name: [name] MRN: [id number] DOB: Sep 06, 1950 Today's Date: 02/29/2016    History of Present Illness Pt s/p L UKR    PT Comments    Pt motivated but impulsive and hoping for dc home this date.  Follow Up Recommendations  Home health PT     Equipment Recommendations  Rolling walker with 5" wheels    Recommendations for Other Services OT consult     Precautions / Restrictions Precautions Precautions: Fall Restrictions Weight Bearing Restrictions: No Other Position/Activity Restrictions: WBAT    Mobility  Bed Mobility Overal bed mobility: Modified Independent             General bed mobility comments: OOB and reports no difficulty with bed mobility  Transfers Overall transfer level: Needs assistance Equipment used: Rolling walker (2 wheeled) Transfers: Sit to/from Stand Sit to Stand: Supervision         General transfer comment: cues for LE management and use of UEs to self assist  Ambulation/Gait Ambulation/Gait assistance: Min guard Ambulation Distance (Feet): 180 Feet Assistive device: Rolling walker (2 wheeled);Crutches Gait Pattern/deviations: Step-to pattern;Step-through pattern;Decreased step length - right;Decreased step length - left;Shuffle;Trunk flexed Gait velocity: decr Gait velocity interpretation: Below normal speed for age/gender General Gait Details: cues for sequence, posture and position from RW.  Pt requesting to use crutches - attempted same with pt agreeing pace was slower and stability more limited.   Stairs            Wheelchair Mobility    Modified Rankin (Stroke Patients Only)       Balance Overall balance assessment: No apparent balance deficits (not formally assessed)                                  Cognition Arousal/Alertness: Awake/alert Behavior During Therapy: WFL for tasks assessed/performed Overall Cognitive Status: Within Functional  Limits for tasks assessed                      Exercises Total Joint Exercises Ankle Circles/Pumps: AROM;Both;15 reps;Supine Quad Sets: AROM;Both;10 reps;Supine Heel Slides: AAROM;Left;15 reps;Supine Straight Leg Raises: AAROM;AROM;Left;15 reps;Supine    General Comments        Pertinent Vitals/Pain Pain Assessment: 0-10 Pain Score: 3  Faces Pain Scale: Hurts a little bit Pain Location: L knee Pain Descriptors / Indicators: Aching;Sore Pain Intervention(s): Limited activity within patient's tolerance;Monitored during session;Premedicated before session;Ice applied    Home Living Family/patient expects to be discharged to:: Private residence Living Arrangements: Spouse/significant other Available Help at Discharge: Family;Available 24 hours/day Type of Home: House Home Access: Stairs to enter Entrance Stairs-Rails: Right;Left;Can reach both Home Layout: Two level Home Equipment: Crutches      Prior Function Level of Independence: Independent          PT Goals (current goals can now be found in the care plan section) Acute Rehab PT Goals Patient Stated Goal: Regain IND PT Goal Formulation: With patient Time For Goal Achievement: 03/03/16 Potential to Achieve Goals: Good Progress towards PT goals: Progressing toward goals    Frequency    7X/week      PT Plan Current plan remains appropriate    Co-evaluation             End of Session Equipment Utilized During Treatment: Gait belt Activity Tolerance: Patient tolerated treatment well Patient left: in chair;with call bell/phone within reach;with family/visitor present  Time: [id number] PT Time Calculation (min) (ACUTE ONLY): 29 min  Charges:  $Gait Training: 8-22 mins $Therapeutic Exercise: 8-22 mins                    G Codes:      [name] Mar 13, 2016, 12:34 PM

## 2016-02-29 NOTE — Progress Notes (Signed)
Subjective: 1 Day Post-Op Procedure(s) (LRB): LEFT UNICOMPARTMENTAL KNEE ARTHROPLASTY (Left) Patient reports pain as moderate.    Objective: Vital signs in last 24 hours: Temp:  [97.3 F (36.3 C)-98.4 F (36.9 C)] 97.5 F (36.4 C) (01/20 0558) Pulse Rate:  [55-86] 56 (01/20 0558) Resp:  [12-26] 18 (01/20 0558) BP: (99-144)/(65-82) 118/66 (01/20 0558) SpO2:  [92 %-100 %] 93 % (01/20 0558)  Intake/Output from previous day: 01/19 0701 - 01/20 0700 In: 3990 [P.O.:660; I.V.:3280; IV Piggyback:50] Out: [id number] [Urine:1175; Blood:100] Intake/Output this shift: Total I/O In: 240 [P.O.:240] Out: -   No results for input(s): HGB in the last 72 hours. No results for input(s): WBC, RBC, HCT, PLT in the last 72 hours. No results for input(s): NA, K, CL, CO2, BUN, CREATININE, GLUCOSE, CALCIUM in the last 72 hours. No results for input(s): LABPT, INR in the last 72 hours.  Sensation intact distally Intact pulses distally Dorsiflexion/Plantar flexion intact Incision: no drainage No cellulitis present Compartment soft  Assessment/Plan: 1 Day Post-Op Procedure(s) (LRB): LEFT UNICOMPARTMENTAL KNEE ARTHROPLASTY (Left) Up with therapy Discharge home with home health today.  Mcarthur Rossetti 02/29/2016, 8:12 AM

## 2016-02-29 NOTE — Discharge Instructions (Signed)

## 2016-02-29 NOTE — Discharge Summary (Signed)
Patient ID: [name] MRN: [id number] DOB/AGE: 1950/07/27 66 y.o.  Admit date: 02/28/2016 Discharge date: 02/29/2016  Admission Diagnoses:  Principal Problem:   Unilateral primary osteoarthritis, left knee Active Problems:   Status post left partial knee replacement   Discharge Diagnoses:  Same  Past Medical History:  Diagnosis Date  . A-fib ([place]) 2007   one occurrence , no issues following thryorid procedure   . Heart murmur    since age 11  . Hyperlipidemia   . Hypertension   . Hypothyroidism   . OSA on CPAP   . Thyroid disease    hyper "zapped"    Surgeries: Procedure(s): LEFT UNICOMPARTMENTAL KNEE ARTHROPLASTY on 02/28/2016   Consultants:   Discharged Condition: Improved  Hospital Course: [name] is an 66 y.o. male who was admitted 02/28/2016 for operative treatment ofUnilateral primary osteoarthritis, left knee. Patient has severe unremitting pain that affects sleep, daily activities, and work/hobbies. After pre-op clearance the patient was taken to the operating room on 02/28/2016 and underwent  Procedure(s): LEFT UNICOMPARTMENTAL KNEE ARTHROPLASTY.    Patient was given perioperative antibiotics: Anti-infectives    Start     Dose/Rate Route Frequency Ordered Stop   02/28/16 1400  clindamycin (CLEOCIN) IVPB 600 mg     600 mg 100 mL/hr over 30 Minutes Intravenous Every 6 hours 02/28/16 1053 02/28/16 2046   02/28/16 0531  clindamycin (CLEOCIN) IVPB 900 mg     900 mg 100 mL/hr over 30 Minutes Intravenous On call to O.R. 02/28/16 0531 02/28/16 0732       Patient was given sequential compression devices, early ambulation, and chemoprophylaxis to prevent DVT.  Patient benefited maximally from hospital stay and there were no complications.    Recent vital signs: Patient Vitals for the past 24 hrs:  BP Temp Temp src Pulse Resp SpO2  02/29/16 0558 118/66 97.5 F (36.4 C) Oral (!) 56 18 93 %  02/29/16 0125 116/69 98.1 F (36.7 C) Oral 63 18 93 %   02/28/16 2122 130/78 98.2 F (36.8 C) Oral 86 18 92 %  02/28/16 1731 124/69 98.3 F (36.8 C) Oral 77 18 93 %  02/28/16 1353 130/77 98.4 F (36.9 C) Oral 73 18 98 %  02/28/16 1248 (!) 144/72 97.3 F (36.3 C) Oral 66 16 100 %  02/28/16 1148 136/70 97.8 F (36.6 C) Oral (!) 58 16 100 %  02/28/16 1048 125/72 97.5 F (36.4 C) - (!) 55 - 100 %  02/28/16 1030 134/81 97.8 F (36.6 C) - (!) 58 15 100 %  02/28/16 1015 135/82 - - (!) 55 12 100 %  02/28/16 1000 132/65 - - 74 (!) 26 100 %  02/28/16 0945 99/66 - - 66 15 99 %  02/28/16 0936 119/65 98 F (36.7 C) - 65 16 97 %     Recent laboratory studies: No results for input(s): WBC, HGB, HCT, PLT, NA, K, CL, CO2, BUN, CREATININE, GLUCOSE, INR, CALCIUM in the last 72 hours.  Invalid input(s): PT, 2   Discharge Medications:   Allergies as of 02/29/2016      Reactions   Penicillins Anaphylaxis   Pt reports throat swelling, difficulty breathing at age 88 Has patient had a PCN reaction causing immediate rash, facial/tongue/throat swelling, SOB or lightheadedness with hypotension: yes Has patient had a PCN reaction causing severe rash involving mucus membranes or skin necrosis: no Has patient had a PCN reaction that required hospitalization no Has patient had a PCN reaction occurring within the  last 10 years: no If all of the above answers are "NO", then may proceed with Cephalosporin use.      Medication List    TAKE these medications   aspirin 81 MG chewable tablet Chew 1 tablet (81 mg total) by mouth 2 (two) times daily.   levothyroxine 175 MCG tablet Commonly known as:  SYNTHROID, LEVOTHROID Take 175 mcg by mouth daily before breakfast.   methocarbamol 500 MG tablet Commonly known as:  ROBAXIN Take 1 tablet (500 mg total) by mouth every 6 (six) hours as needed for muscle spasms.   olmesartan-hydrochlorothiazide 40-25 MG tablet Commonly known as:  BENICAR HCT Take 1 tablet by mouth daily.   OVER THE COUNTER MEDICATION Take 1  capsule by mouth daily. Zyflamend Whole Body (10-herb blend for pain relief/inflammation--Rosemary,Turmeric, Green Tea, Ginseng, Chinese Skullcap, [place] Fritch, [place]ie [place], [place], holly basil, organic oregano)   oxyCODONE-acetaminophen 5-325 MG tablet Commonly known as:  ROXICET Take 1-2 tablets by mouth every 4 (four) hours as needed.   sertraline 100 MG tablet Commonly known as:  ZOLOFT Take 50 mg by mouth daily.            Durable Medical Equipment        Start     Ordered   02/28/16 1054  DME Walker rolling  Once    Question:  Patient needs a walker to treat with the following condition  Answer:  Status post left partial knee replacement   02/28/16 1053      Diagnostic Studies: Dg Knee Left Port  Result Date: 02/28/2016 CLINICAL DATA:  Partial knee replacement EXAM: PORTABLE LEFT KNEE - 1-2 VIEW COMPARISON:  None. FINDINGS: Two-view show medial compartment arthroplasty. Components appear well positioned. There appears to be some cement material extending posterior to the tibial component. Significance unclear to me. Fluid and air in the joint as expected. Some osteoarthritis of the patellofemoral joint. IMPRESSION: Medial compartment arthroplasty. Some cement material extending posterior to the tibial component. Electronically Signed   By: Nelson Chimes M.D.   On: 02/28/2016 10:22    Disposition: 01-Home or Self Care  Discharge Instructions    Discharge patient    Complete by:  As directed    Discharge disposition:  01-Home or Self Care   Discharge patient date:  02/29/2016      Follow-up Information    Mcarthur Rossetti, MD Follow up in 2 week(s).   Specialty:  Orthopedic Surgery Contact information: [place] [address] [phone]            Signed: Mcarthur Rossetti 02/29/2016, 8:14 AM

## 2016-03-02 NOTE — Addendum Note (Signed)
Addendum  created 03/02/16 0901 by Lollie Sails, CRNA   Charge Capture section accepted

## 2016-03-04 ENCOUNTER — Telehealth (INDEPENDENT_AMBULATORY_CARE_PROVIDER_SITE_OTHER): Payer: Self-pay | Admitting: Orthopaedic Surgery

## 2016-03-04 ENCOUNTER — Encounter: Payer: Federal, State, Local not specified - PPO | Admitting: Oncology

## 2016-03-04 NOTE — Telephone Encounter (Signed)
[facility] requesting verbal for phys therapy 3x wk for 2 wks   [place] [phone]

## 2016-03-05 ENCOUNTER — Other Ambulatory Visit (INDEPENDENT_AMBULATORY_CARE_PROVIDER_SITE_OTHER): Payer: Self-pay | Admitting: Orthopaedic Surgery

## 2016-03-05 ENCOUNTER — Telehealth (INDEPENDENT_AMBULATORY_CARE_PROVIDER_SITE_OTHER): Payer: Self-pay | Admitting: Orthopaedic Surgery

## 2016-03-05 MED ORDER — HYDROCODONE-ACETAMINOPHEN 5-325 MG PO TABS: 1.0000 | ORAL_TABLET | Freq: Three times a day (TID) | ORAL | 0 refills | Status: DC | PRN

## 2016-03-05 NOTE — Telephone Encounter (Signed)
Please advise 

## 2016-03-05 NOTE — Telephone Encounter (Signed)
Patient calling today, he is status post uni knee replacement. He is taking oxycodone 1-2 po q 4 hours. He does not know if he has a reaction to medication. He denies redness, nausea, fever or chills. The patient is very anxious about possible addictive nature of medication. He request a call back to discuss medication to see if he needs to change medication.

## 2016-03-05 NOTE — Telephone Encounter (Signed)
Find out what he would like to take.  We can go down to hydrocodone if her wants.

## 2016-03-05 NOTE — Telephone Encounter (Signed)
Verbal order given on VM 

## 2016-03-05 NOTE — Telephone Encounter (Signed)
Patient states he will try the [facility]

## 2016-03-11 ENCOUNTER — Ambulatory Visit: Payer: Federal, State, Local not specified - PPO

## 2016-03-12 ENCOUNTER — Ambulatory Visit (HOSPITAL_COMMUNITY)
Admission: RE | Admit: 2016-03-12 | Discharge: 2016-03-12 | Disposition: A | Discharge status: Home / Self Care | Payer: Federal, State, Local not specified - PPO | Source: Ambulatory Visit | Attending: Orthopaedic Surgery | Admitting: Orthopaedic Surgery

## 2016-03-12 ENCOUNTER — Other Ambulatory Visit (INDEPENDENT_AMBULATORY_CARE_PROVIDER_SITE_OTHER): Payer: Self-pay

## 2016-03-12 ENCOUNTER — Ambulatory Visit (INDEPENDENT_AMBULATORY_CARE_PROVIDER_SITE_OTHER): Payer: Federal, State, Local not specified - PPO | Admitting: Orthopaedic Surgery

## 2016-03-12 DIAGNOSIS — M79662 Pain in left lower leg: Secondary | ICD-10-CM | POA: Insufficient documentation

## 2016-03-12 DIAGNOSIS — Z96652 Presence of left artificial knee joint: Secondary | ICD-10-CM

## 2016-03-12 MED ORDER — HYDROCODONE-ACETAMINOPHEN 7.5-325 MG PO TABS: 1.0000 | ORAL_TABLET | Freq: Four times a day (QID) | ORAL | 0 refills | Status: DC | PRN

## 2016-03-12 NOTE — Progress Notes (Signed)
*  PRELIMINARY RESULTS* Vascular Ultrasound Left lower extremity venous duplex has been completed.  Preliminary findings: no evidence of DVT. 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    Called results to [place].    Landry Mellow, RDMS, RVT  03/12/2016, 3:46 PM

## 2016-03-12 NOTE — Progress Notes (Signed)
The patient is 2 weeks status post a left unicompartmental partial knee replacement. He is doing well. He does have some calf pain. He is working with home health therapy.  On examination his incision looks great. I removed the old Steri-Strips and placed new ones. He has just a mild effusion. His extension Thomas fullness flexion is a past 90. His calf is soft but is painful. His foot has good perfusion.  At this point I would like to send him for a Doppler ultrasound to rule out DVT. If this is negative. His twice-daily aspirin and can start Aleve twice a day. I did refill his hydrocodone as well. If the DVT study is positive for DVT then we'll need to start him on several to 50 mg twice daily for 3 weeks followed by 10-20 mg daily for 36 months. I like see him back in 2 weeks' 0 is doing overall but no x-rays are needed.

## 2016-03-17 ENCOUNTER — Telehealth: Payer: Self-pay | Admitting: Oncology

## 2016-03-17 NOTE — Telephone Encounter (Signed)
Spoke with patient re new appointment for 2/16 @ 2 pm with Dr. Alen Blew. Patient seen by Dr. Talbert Cage and aware he will f/u with Dr. Alen Blew.

## 2016-03-19 ENCOUNTER — Telehealth: Payer: Self-pay | Admitting: Oncology

## 2016-03-19 NOTE — Telephone Encounter (Signed)
lvm to inform pt of r/s 2/22 appt to 2/21 at 130 pm per MD eamil

## 2016-03-24 ENCOUNTER — Encounter: Payer: Federal, State, Local not specified - PPO | Admitting: Oncology

## 2016-03-26 ENCOUNTER — Ambulatory Visit (INDEPENDENT_AMBULATORY_CARE_PROVIDER_SITE_OTHER): Payer: Federal, State, Local not specified - PPO | Admitting: Orthopaedic Surgery

## 2016-03-26 ENCOUNTER — Encounter (INDEPENDENT_AMBULATORY_CARE_PROVIDER_SITE_OTHER): Payer: Self-pay | Admitting: Orthopaedic Surgery

## 2016-03-26 DIAGNOSIS — Z96652 Presence of left artificial knee joint: Secondary | ICD-10-CM

## 2016-03-26 NOTE — Progress Notes (Signed)
The patient is now 5 weeks status post a left tarsal knee replacement. He's been doing well. He said his blood pressure has been elevated since surgery. He is on blood pressure medication and he had good control before the surgery. He's had an daily pain but is not severe. He takes occasional hydrocodone and 1 Aleve daily.  Generalization of his left knee is got excellent flexion-extension. His incisions healed nicely. The knee feels stable.  At this point I did refill his hydrocodone. I want him to increase his Aleve to 2 pills twice daily. I've encouraged him to see his primary care physician soon about his blood pressure. I like see him back in a month for repeat evaluation. I would like an AP and lateral of his left knee at that visit.

## 2016-03-27 ENCOUNTER — Ambulatory Visit: Payer: Federal, State, Local not specified - PPO | Admitting: Oncology

## 2016-04-01 ENCOUNTER — Ambulatory Visit (HOSPITAL_BASED_OUTPATIENT_CLINIC_OR_DEPARTMENT_OTHER): Payer: Federal, State, Local not specified - PPO | Admitting: Oncology

## 2016-04-01 VITALS — BP 146/81 | HR 68 | Temp 98.4°F | Resp 18 | Ht 69.5 in | Wt 257.5 lb

## 2016-04-01 DIAGNOSIS — D751 Secondary polycythemia: Secondary | ICD-10-CM

## 2016-04-01 NOTE — Progress Notes (Signed)
Hematology and Oncology Follow Up Visit  [name] [id number] 28-Dec-1950 66 y.o. 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 This was diagnosed in January 2018. JAK2 mutation was not detected.  Current therapy: Observation and surveillance.  Interim History: [name] presents today for a follow-up visit. He is a pleasant gentleman seen in consultation in January 2018 for evaluation of secondary polycythemia. He also has history of sleep apnea and has been receiving testosterone supplements. He underwent a knee operation and is recovering well from this procedure. He denied any recent thrombosis or bleeding episodes. He is mobility has improved at this time. He does not report any constitutional symptoms of weight loss, fevers or chills.  He does not report any headaches, blurry vision, syncope or seizures. He does not report any chest pain, palpitation, orthopnea or leg edema. He does not report any cough, wheezing or hemoptysis. He does not report any nausea, vomiting or abdominal pain. He does not report any constipation or diarrhea. He does not report any frequency urgency or hesitancy. Remaining review of systems unremarkable.  Medications: I have reviewed the patient's current medications.  Current Outpatient Prescriptions  Medication Sig Dispense Refill  . HYDROcodone-acetaminophen (NORCO) 7.5-325 MG tablet Take 1-2 tablets by mouth every 6 (six) hours as needed for moderate pain. 60 tablet 0  . levothyroxine (SYNTHROID, LEVOTHROID) 175 MCG tablet Take 175 mcg by mouth daily before breakfast.     . methocarbamol (ROBAXIN) 500 MG tablet Take 1 tablet (500 mg total) by mouth every 6 (six) hours as needed for muscle spasms. 60 tablet 0  . olmesartan-hydrochlorothiazide (BENICAR HCT) 40-25 MG tablet Take 1 tablet by mouth daily.    Marland Kitchen OVER THE  COUNTER MEDICATION Take 1 capsule by mouth daily. Zyflamend Whole Body (10-herb blend for pain relief/inflammation--Rosemary,Turmeric, Green Tea, Ginseng, Chinese Skullcap, [place], [place], [place], holly basil, organic oregano)    . sertraline (ZOLOFT) 100 MG tablet Take 50 mg by mouth daily.     Marland Kitchen testosterone cypionate (DEPOTESTOSTERONE CYPIONATE) 200 MG/ML injection      No current facility-administered medications for this visit.      Allergies:  Allergies  Allergen Reactions  . Penicillins Anaphylaxis    Pt reports throat swelling, difficulty breathing at age 73 Has patient had a PCN reaction causing immediate rash, facial/tongue/throat swelling, SOB or lightheadedness with hypotension: yes Has patient had a PCN reaction causing severe rash involving mucus membranes or skin necrosis: no Has patient had a PCN reaction that required hospitalization no Has patient had a PCN reaction occurring within the last 10 years: no If all of the above answers are "NO", then may proceed with Cephalosporin use.     Past Medical History, Surgical history, Social history, and Family History were reviewed and updated.  Physical Exam: Blood pressure (!) 146/81, pulse 68, temperature 98.4 F (36.9 C), temperature source Oral, resp. rate 18, height 5' 9.5" (1.765 m), weight 257 lb 8 oz (116.8 kg), SpO2 98 %. ECOG: 0 General appearance: alert and cooperative Head: Normocephalic, without obvious abnormality Neck: no adenopathy Lymph nodes: Cervical, supraclavicular, and axillary nodes normal. Heart:regular rate and rhythm, S1, S2 normal, no murmur, click, rub or gallop Lung:chest clear, no wheezing, rales, normal symmetric air entry. Abdomin: soft, non-tender, without masses or organomegaly EXT:no erythema, induration, or nodules   Lab Results: Lab Results  Component Value Date   WBC 7.9  02/25/2016   HGB 17.3 (H) 02/25/2016   HCT 51.7 (H) 02/25/2016   MCV 90.2 02/25/2016   PLT  216 02/25/2016     Chemistry      Component Value Date/Time   NA 139 02/25/2016 1204   K 4.2 02/25/2016 1204   CL 101 02/24/2016 1022   CO2 30 (H) 02/25/2016 1204   BUN 16.9 02/25/2016 1204   CREATININE 1.1 02/25/2016 1204      Component Value Date/Time   CALCIUM 9.7 02/25/2016 1204   ALKPHOS 74 02/25/2016 1204   AST 17 02/25/2016 1204   ALT 14 02/25/2016 1204   BILITOT 0.61 02/25/2016 1204      Impression and Plan:  66 year old gentleman with the following issues:  1. Polycythemia due to secondary causes: These causes include sleep apnea and testosterone replacement. His workup for polycythemia vera or myeloproliferative disorder has been negative with no JAK2 mutation detected.  Our management standpoint, I have recommended periodic blood donation to keep his hemoglobin around 16. I see no reason for her therapeutic phlebotomy at this time. His risk of thrombosis is very low at this time.  2. Follow-up: I am happy to see him in the future as needed.   Zola Button, MD 2/21/20181:50 PM

## 2016-04-23 ENCOUNTER — Encounter (INDEPENDENT_AMBULATORY_CARE_PROVIDER_SITE_OTHER): Payer: Self-pay | Admitting: Orthopaedic Surgery

## 2016-04-23 ENCOUNTER — Ambulatory Visit (INDEPENDENT_AMBULATORY_CARE_PROVIDER_SITE_OTHER): Payer: Self-pay

## 2016-04-23 ENCOUNTER — Ambulatory Visit (INDEPENDENT_AMBULATORY_CARE_PROVIDER_SITE_OTHER): Payer: Federal, State, Local not specified - PPO | Admitting: Orthopaedic Surgery

## 2016-04-23 DIAGNOSIS — Z96652 Presence of left artificial knee joint: Secondary | ICD-10-CM | POA: Diagnosis not present

## 2016-04-23 DIAGNOSIS — Z09 Encounter for follow-up examination after completed treatment for conditions other than malignant neoplasm: Secondary | ICD-10-CM | POA: Diagnosis not present

## 2016-04-23 NOTE — Progress Notes (Signed)
The patient is now 2 months status post a left unicompartmental knee replacement of the medial compartment. He is doing well. He is having some stiffness in the knee. We will get x-rays today since he had a bad fall down the steps last week.  On exam his extension is full has flexion to almost full the knee feels ligamentously stable. The swelling is minimal. Lateral of the knee show well-seated implant a left knee there is residual bone cement back in the hip capsule but it does not appear to be affecting anything. There is no evidence of loosening or fracture.  At this point a continued increase his activities work on Forensic scientist exercises. We'll see him back in a month to see how is doing overall but no x-rays are needed.

## 2016-05-18 ENCOUNTER — Telehealth (INDEPENDENT_AMBULATORY_CARE_PROVIDER_SITE_OTHER): Payer: Self-pay | Admitting: *Deleted

## 2016-05-18 NOTE — Telephone Encounter (Signed)
FYI

## 2016-05-18 NOTE — Telephone Encounter (Signed)
Pt called stating he is having some trouble with a Cyst on the back of his knee and would like to discuss this at his appt on 4/12.

## 2016-05-21 ENCOUNTER — Ambulatory Visit (INDEPENDENT_AMBULATORY_CARE_PROVIDER_SITE_OTHER): Payer: Federal, State, Local not specified - PPO | Admitting: Orthopaedic Surgery

## 2016-05-21 DIAGNOSIS — Z96652 Presence of left artificial knee joint: Secondary | ICD-10-CM

## 2016-05-21 NOTE — Progress Notes (Signed)
[name] is still under 3 months status post a left unicompartmental knee replacement of the medial compartment of his knee. He is concerned about a Baker's cyst as his knee and the fact that he still having pain that had given him reassurance that he still under 3 months since surgery.  On examination his extension is full his flexion is to about 115 it is painful but is improved dramatically.  I did give him a steroid injection in his knee cutting as well, down the inflammation I gave him encouragement he's doing well and she can take Aleve as an anti-inflammatory. I'll see him back in 6 weeks to see how is doing overall but no x-rays are needed.

## 2016-06-30 ENCOUNTER — Encounter (INDEPENDENT_AMBULATORY_CARE_PROVIDER_SITE_OTHER): Payer: Self-pay

## 2016-06-30 ENCOUNTER — Ambulatory Visit (INDEPENDENT_AMBULATORY_CARE_PROVIDER_SITE_OTHER): Payer: Federal, State, Local not specified - PPO | Admitting: Orthopaedic Surgery

## 2016-07-08 ENCOUNTER — Ambulatory Visit (INDEPENDENT_AMBULATORY_CARE_PROVIDER_SITE_OTHER): Payer: Federal, State, Local not specified - PPO | Admitting: Orthopaedic Surgery

## 2016-07-08 DIAGNOSIS — Z96652 Presence of left artificial knee joint: Secondary | ICD-10-CM

## 2016-07-08 NOTE — Progress Notes (Signed)
[name] is now 4 months status post a left partial knee replacement. His range of motion is improving. He is very active 66 year old and still wants to have his knee flexing a little bit more. He is also pointing the IT band is having significant iliotibial band syndrome.  On examination is got excellent range of motion of his knee but is not full flexion yet but is getting close. The knee swelling has gone down dramatically and the knee feels ligamentously stable.  I gave him prescription to consider outpatient physical therapy to work on calming down his IT band syndrome and improving his strength and range of motion. I will see him back myself for 3 months. I would like an AP and lateral of his left knee at that visit.

## 2016-07-17 ENCOUNTER — Ambulatory Visit (INDEPENDENT_AMBULATORY_CARE_PROVIDER_SITE_OTHER): Payer: Federal, State, Local not specified - PPO | Admitting: Ophthalmology

## 2016-10-01 NOTE — Addendum Note (Signed)
Addendum  created 10/01/16 1003 by Roberts Gaudy, MD   Sign clinical note

## 2016-10-01 NOTE — Addendum Note (Signed)
Addendum  created 10/01/16 1005 by Roberts Gaudy, MD   Sign clinical note

## 2016-10-08 ENCOUNTER — Ambulatory Visit (INDEPENDENT_AMBULATORY_CARE_PROVIDER_SITE_OTHER): Payer: Federal, State, Local not specified - PPO | Admitting: Orthopaedic Surgery

## 2016-10-13 ENCOUNTER — Ambulatory Visit (INDEPENDENT_AMBULATORY_CARE_PROVIDER_SITE_OTHER): Payer: Federal, State, Local not specified - PPO | Admitting: Physician Assistant

## 2016-10-13 ENCOUNTER — Ambulatory Visit (INDEPENDENT_AMBULATORY_CARE_PROVIDER_SITE_OTHER): Payer: Federal, State, Local not specified - PPO

## 2016-10-13 DIAGNOSIS — Z96652 Presence of left artificial knee joint: Secondary | ICD-10-CM

## 2016-10-13 NOTE — Progress Notes (Signed)
Office Visit Note   Patient: [name]           Date of Birth: 08/16/1950           MRN: [id number] Visit Date: 10/13/2016              Requested by: Deland Pretty, MD [address] [place], [place] [address] PCP: Deland Pretty, MD   Assessment & Plan: Visit Diagnoses:  1. History of left knee replacement     Plan: He'll continue to work on quad strengthening tickly his VMO. Exercises are reviewed with him. He'll follow with [place] at 1 year postop. AP and lateral views of the left knee that time.  Follow-Up Instructions: Return in about 4 months (around 02/12/2017).   Orders:  Orders Placed This Encounter  Procedures  . XR Knee 1-2 Views Left   No orders of the defined types were placed in this encounter.     Procedures: No procedures performed   Clinical Data: No additional findings.   Subjective: No chief complaint on file.   HPI Mr. [name] is 66 year old male 7 months status post left unicompartmental knee arthroplasty. He overall states he's doing well. Some tenderness down side of the knee was lateral aspect. States overall his range of motion and strength improving. He feels as if he cannot bend the left knee is much as the right. Review of Systems Please see history of present illness otherwise negative  Objective: Vital Signs: There were no vitals taken for this visit.  Physical Exam  Constitutional: He is oriented to person, place, and time. He appears well-developed and well-nourished. No distress.  Pulmonary/Chest: Effort normal.  Neurological: He is alert and oriented to person, place, and time.  Skin: He is not diaphoretic.    Ortho Exam Right knee good range of motion without pain. Left knee has a good range of motion without pain positive crepitus left knee patellofemoral area with range of motion. Nontender over the medial lateral joint line no effusion abnormal warmth erythema of either knee. No instability left knee with  valgus varus stressing. Specialty Comments:  No specialty comments available.  Imaging: Xr Knee 1-2 Views Left  Result Date: 10/13/2016 AP lateral left knee: Medial unicompartmental arthroplasty components are well seated. No acute fractures. No bony abnormalities. Knees well located    PMFS History: Patient Active Problem List   Diagnosis Date Noted  . Unilateral primary osteoarthritis, left knee 02/28/2016  . 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 Chest pain with moderate risk for cardiac etiology 06/19/2013  . HTN (hypertension) 06/19/2013  . HLD (hyperlipidemia) 06/19/2013  . Obesity 06/19/2013   Past Medical History:  Diagnosis Date  . A-fib ([place]) 2007   one occurrence , no issues following thryorid procedure   . Heart murmur    since age 64  . Hyperlipidemia   . Hypertension   . Hypothyroidism   . OSA on CPAP   . Thyroid disease    hyper "zapped"    Family History  Problem Relation Age of Onset  . Heart failure Father   . Heart disease Father   . Stroke Maternal Grandfather   . Heart disease Paternal Grandfather     Past Surgical History:  Procedure Laterality Date  . APPENDECTOMY    . KNEE SURGERY    . ligament stapled   1980s  . PARTIAL KNEE ARTHROPLASTY Left 02/28/2016   Procedure: LEFT UNICOMPARTMENTAL KNEE ARTHROPLASTY;  Surgeon: Mcarthur Rossetti,  MD;  Location: [facility];  Service: Orthopedics;  Laterality: Left;   Social History   Occupational History  . Not on file.   Social History Main Topics  . Smoking status: Former Smoker    Types: Pipe  . Smokeless tobacco: Never Used     Comment: quit 40 years ago  . Alcohol use Yes     Comment: rarely , glass of wine once a year  . Drug use: No  . Sexual activity: Yes

## 2016-12-14 ENCOUNTER — Encounter: Payer: Self-pay | Admitting: Family Medicine

## 2016-12-14 ENCOUNTER — Ambulatory Visit: Payer: Federal, State, Local not specified - PPO | Admitting: Family Medicine

## 2016-12-14 VITALS — BP 134/70 | HR 85 | Temp 98.4°F | Resp 16 | Ht 69.5 in | Wt 265.4 lb

## 2016-12-14 DIAGNOSIS — L0211 Cutaneous abscess of neck: Secondary | ICD-10-CM

## 2016-12-14 MED ORDER — LIDOCAINE-EPINEPHRINE 2 %-1:100000 IJ SOLN
10.0000 mL | Freq: Once | INTRAMUSCULAR | Status: AC
  Administered 2016-12-14: 10 mL via INTRADERMAL

## 2016-12-14 MED ORDER — SULFAMETHOXAZOLE-TRIMETHOPRIM 800-160 MG PO TABS: 1.0000 | ORAL_TABLET | Freq: Two times a day (BID) | ORAL | 0 refills | Status: DC

## 2016-12-14 NOTE — Patient Instructions (Addendum)
Call for appointment  [facility], [address] [place], [place] [address]  Main: [phone]  Fax: [phone]      Folliculitis Folliculitis is inflammation of the hair follicles. Folliculitis most commonly occurs on the scalp, thighs, legs, back, and buttocks. However, it can occur anywhere on the body. What are the causes? This condition may be caused by:  A bacterial infection (common).  A fungal infection.  A viral infection.  Coming into contact with certain chemicals, especially oils and tars.  Shaving or waxing.  Applying greasy ointments or creams to your skin often.  Long-lasting folliculitis and folliculitis that keeps coming back can be caused by bacteria that live in the nostrils. What increases the risk? This condition is more likely to develop in people with:  A weakened immune system.  Diabetes.  Obesity.  What are the signs or symptoms? Symptoms of this condition include:  Redness.  Soreness.  Swelling.  Itching.  Small white or yellow, pus-filled, itchy spots (pustules) that appear over a reddened area. If there is an infection that goes deep into the follicle, these may develop into a boil (furuncle).  A group of closely packed boils (carbuncle). These tend to form in hairy, sweaty areas of the body.  How is this diagnosed? This condition is diagnosed with a skin exam. To find what is causing the condition, your health care provider may take a sample of one of the pustules or boils for testing. How is this treated? This condition may be treated by:  Applying warm compresses to the affected areas.  Taking an antibiotic medicine or applying an antibiotic medicine to the skin.  Applying or bathing with an antiseptic solution.  Taking an over-the-counter medicine to help with itching.  Having a procedure to drain any pustules or boils. This may be done if a pustule or boil contains a lot of  pus or fluid.  Laser hair removal. This may be done to treat long-lasting folliculitis.  Follow these instructions at home:  If directed, apply heat to the affected area as often as told by your health care provider. Use the heat source that your health care provider recommends, such as a moist heat pack or a heating pad. ? Place a towel between your skin and the heat source. ? Leave the heat on for 20-30 minutes. ? Remove the heat if your skin turns bright red. This is especially important if you are unable to feel pain, heat, or cold. You may have a greater risk of getting burned.  If you were prescribed an antibiotic medicine, use it as told by your health care provider. Do not stop using the antibiotic even if you start to feel better.  Take over-the-counter and prescription medicines only as told by your health care provider.  Do not shave irritated skin.  Keep all follow-up visits as told by your health care provider. This is important. Get help right away if:  You have more redness, swelling, or pain in the affected area.  Red streaks are spreading from the affected area.  You have a fever. This information is not intended to replace advice given to you by your health care provider. Make sure you discuss any questions you have with your health care provider. Document Released: 04/06/2001 Document Revised: 08/16/2015 Document Reviewed: 11/16/2014 Elsevier Interactive Patient Education  2018 [employer].

## 2016-12-14 NOTE — Progress Notes (Signed)
Chief Complaint  Patient presents with  . hair follicle    ? infection back of hairline left side, 2-3 cm in diameter and has doubled in size in the last 36 hours, warm to touch    HPI   Pt presented with infection on the back of the hairline His wife tried to squeeze some pus from a small opening  It feels like it has doubled in size in the past 36 hours after it was squeezed He put neosporin on it but no improvement It was so painful he could not sleep last night    Past Medical History:  Diagnosis Date  . A-fib ([place]) 2007   one occurrence , no issues following thryorid procedure   . Heart murmur    since age 57  . Hyperlipidemia   . Hypertension   . Hypothyroidism   . OSA on CPAP   . Thyroid disease    hyper "zapped"    Current Outpatient Medications  Medication Sig Dispense Refill  . HYDROcodone-acetaminophen (NORCO) 7.5-325 MG tablet Take 1-2 tablets by mouth every 6 (six) hours as needed for moderate pain. (Patient not taking: Reported on 12/14/2016) 60 tablet 0  . levothyroxine (SYNTHROID, LEVOTHROID) 175 MCG tablet Take 175 mcg by mouth daily before breakfast.     . methocarbamol (ROBAXIN) 500 MG tablet Take 1 tablet (500 mg total) by mouth every 6 (six) hours as needed for muscle spasms. (Patient not taking: Reported on 12/14/2016) 60 tablet 0  . olmesartan-hydrochlorothiazide (BENICAR HCT) 40-25 MG tablet Take 1 tablet by mouth daily.    Marland Kitchen OVER THE COUNTER MEDICATION Take 1 capsule by mouth daily. Zyflamend Whole Body (10-herb blend for pain relief/inflammation--Rosemary,Turmeric, Green Tea, Ginseng, Chinese Skullcap, [place], [place], [place], holly basil, organic oregano)    . sertraline (ZOLOFT) 100 MG tablet Take 50 mg by mouth daily.     Marland Kitchen sulfamethoxazole-trimethoprim (BACTRIM DS,SEPTRA DS) 800-160 MG tablet Take 1 tablet 2 (two) times daily by mouth. 20 tablet 0  . testosterone cypionate (DEPOTESTOSTERONE CYPIONATE) 200 MG/ML injection      No  current facility-administered medications for this visit.     Allergies:  Allergies  Allergen Reactions  . Penicillins Anaphylaxis    Pt reports throat swelling, difficulty breathing at age 51 Has patient had a PCN reaction causing immediate rash, facial/tongue/throat swelling, SOB or lightheadedness with hypotension: yes Has patient had a PCN reaction causing severe rash involving mucus membranes or skin necrosis: no Has patient had a PCN reaction that required hospitalization no Has patient had a PCN reaction occurring within the last 10 years: no If all of the above answers are "NO", then may proceed with Cephalosporin use.     Past Surgical History:  Procedure Laterality Date  . APPENDECTOMY    . KNEE SURGERY    . ligament stapled   1980s    Social History   Socioeconomic History  . Marital status: Married    Spouse name: None  . Number of children: None  . Years of education: None  . Highest education level: None  Social Needs  . Financial resource strain: None  . Food insecurity - worry: None  . Food insecurity - inability: None  . Transportation needs - medical: None  . Transportation needs - non-medical: None  Occupational History  . None  Tobacco Use  . Smoking status: Former Smoker    Types: Pipe  . Smokeless tobacco: Never Used  . Tobacco comment: quit 40 years ago  Substance and Sexual Activity  . Alcohol use: Yes    Comment: rarely , glass of wine once a year  . Drug use: No  . Sexual activity: Yes  Other Topics Concern  . None  Social History Narrative  . None    Family History  Problem Relation Age of Onset  . Heart failure Father   . Heart disease Father   . Stroke Maternal Grandfather   . Heart disease Paternal Grandfather      ROS Review of Systems See HPI Constitution: No fevers or chills No malaise No diaphoresis Skin: No rash or itching Eyes: no blurry vision, no double vision GU: no dysuria or hematuria Neuro: no dizziness  or headaches   Objective: Vitals:   12/14/16 1209  BP: 134/70  Pulse: 85  Resp: 16  Temp: 98.4 F (36.9 C)  TempSrc: Oral  SpO2: 96%  Weight: 265 lb 6.4 oz (120.4 kg)  Height: 5' 9.5" (1.765 m)    Physical Exam  Constitutional: He is oriented to person, place, and time. He appears well-developed and well-nourished.  HENT:  Head: Normocephalic and atraumatic.  Cardiovascular: Normal rate, regular rhythm and normal heart sounds.  Pulmonary/Chest: Effort normal and breath sounds normal. No stridor. No respiratory distress.  Neurological: He is alert and oriented to person, place, and time.  Psychiatric: He has a normal mood and affect. His behavior is normal. Judgment and thought content normal.    Base of neck with fluctuance and induration but no discrete pocket  There is a pustule that has lanced spontaneously The skin is erythematous  After applying warm compress the pustule appeared ripe and the decision was made to attempt a I&D Informed consent was received and time out performed.  The area was cleaned and 5cc of lidocaine with Epi was used An 11 blade was used to open the pustule and using a sharp scissors some blunt dissection was performed The area had no bleeding but no purulent drainage was noted Further evaluation showed thick tissue and no pocket was exposed  The procedure was stopped as deep dissection would lead to bleeding Pt was given a pressure dressing and advised to follow up with Gen Surgery He was sent home with antibiotic and instructions for home care   Assessment and [place] was seen today for hair follicle.  Diagnoses and all orders for this visit:  Abscess, neck -     lidocaine-EPINEPHrine (XYLOCAINE W/EPI) 2 %-1:100000 (with pres) injection 10 mL -     Ambulatory referral to General Surgery -     sulfamethoxazole-trimethoprim (BACTRIM DS,SEPTRA DS) 800-160 MG tablet; Take 1 tablet 2 (two) times daily by mouth. Bactrim DS given to cover  for MRSA     Destynee Stringfellow A Nolon Rod

## 2016-12-15 ENCOUNTER — Telehealth: Payer: Self-pay | Admitting: Family Medicine

## 2016-12-15 NOTE — Telephone Encounter (Signed)
Copied from [place] [phone]. Topic: Referral - Request >> Dec 15, 2016  4:47 PM Cleaster Corin, NT wrote: CRM for notification. See Telephone encounter for:   12/15/16. Pt. [name]  Needs a referral to go to [facility] [place] [facility]  Cyst on neck has actually grown within the past 48 hours and he is having pain Pt. Said he is willing to come pick up referral or it can be faxed to office ([facility] [place] [facility] [phone] Reason for CRM: ***

## 2016-12-17 ENCOUNTER — Ambulatory Visit: Payer: Federal, State, Local not specified - PPO | Admitting: Family Medicine

## 2016-12-21 NOTE — Telephone Encounter (Signed)
Referral taken to [facility] by Unk Pinto on 12/18/16 due to [place] fax not working

## 2017-02-14 IMAGING — DX DG KNEE 1-2V PORT*L*
2 series · 2 of 2 positions shown · non-contrast
Comparison: None.

CLINICAL DATA: Partial knee replacement

EXAM:
PORTABLE LEFT KNEE - 1-2 VIEW

[knee ap]
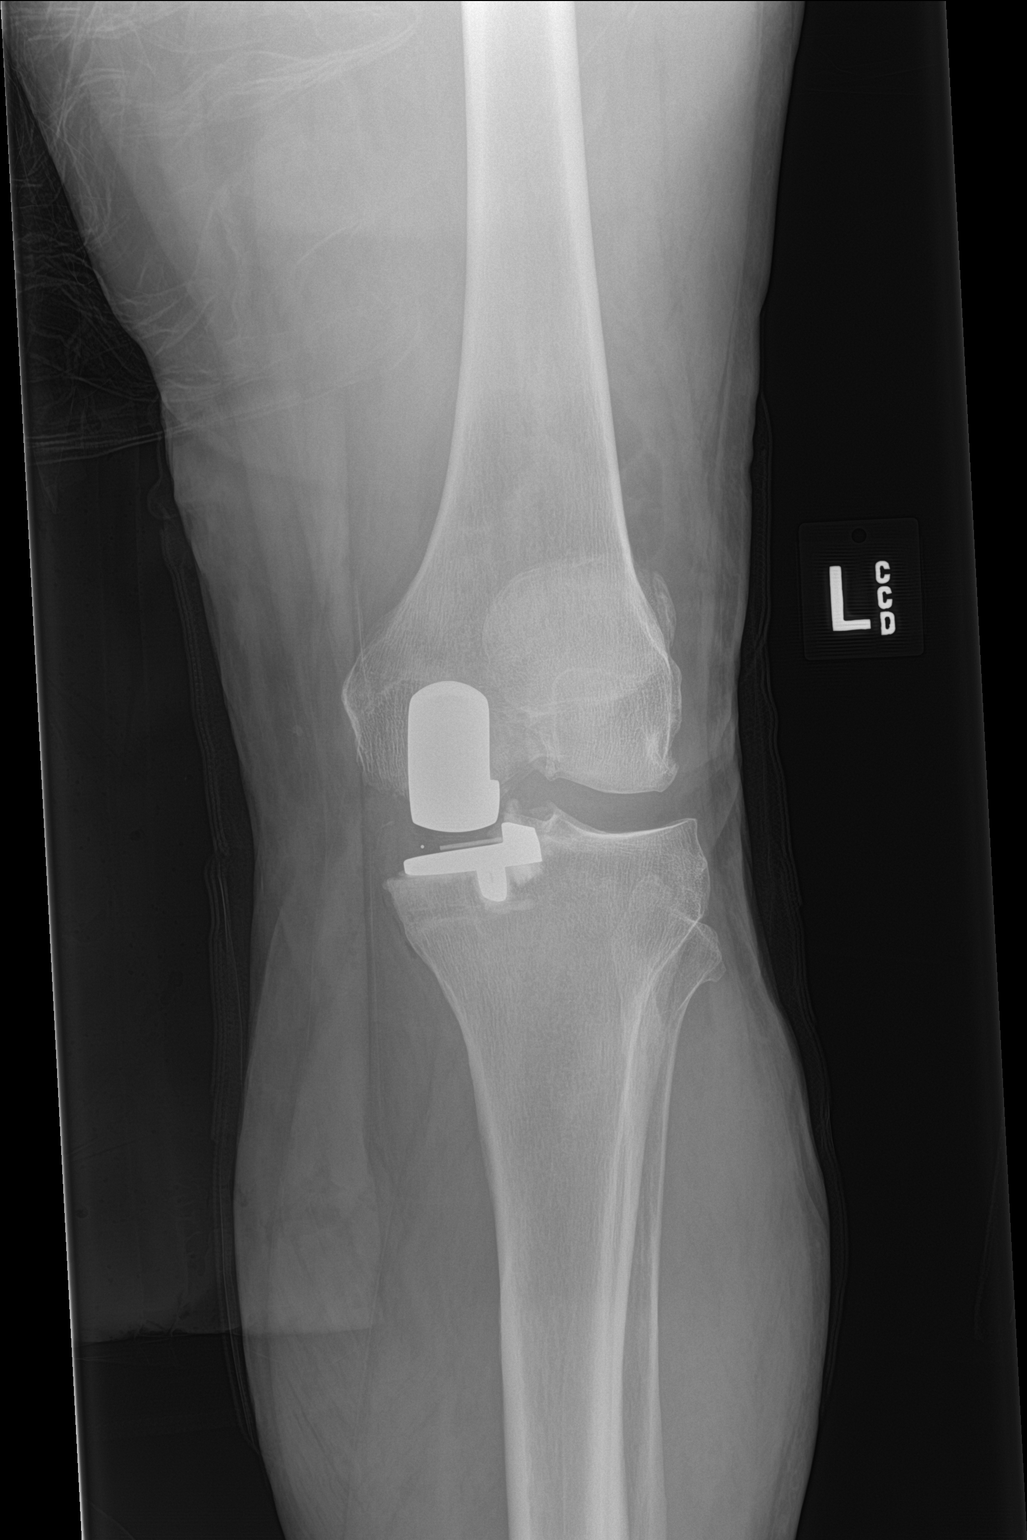

[knee lat]
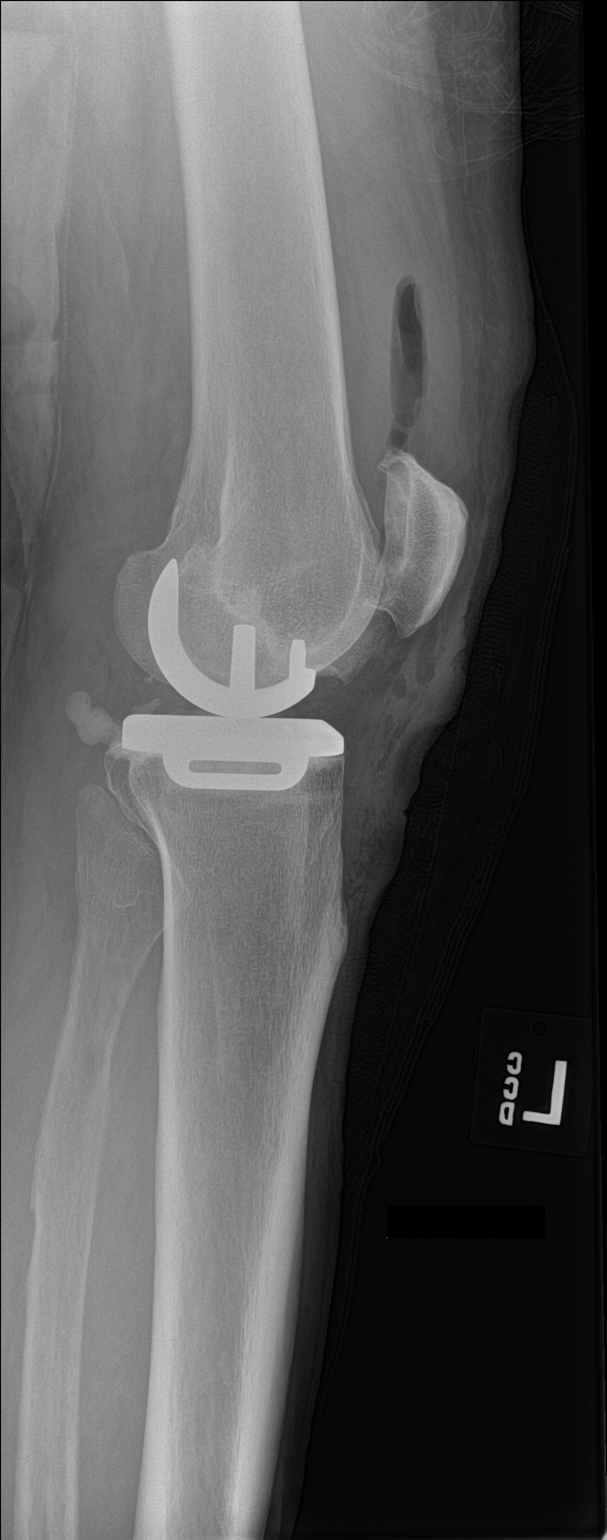

[2 of 2 positions shown; findings below may reference images not displayed]

FINDINGS: Two-view show medial compartment arthroplasty. Components appear
well positioned. There appears to be some cement material extending
posterior to the tibial component. Significance unclear to me. Fluid
and air in the joint as expected. Some osteoarthritis of the
patellofemoral joint.
IMPRESSION: Medial compartment arthroplasty. Some cement material extending
posterior to the tibial component.

## 2017-02-23 ENCOUNTER — Encounter (INDEPENDENT_AMBULATORY_CARE_PROVIDER_SITE_OTHER): Payer: Self-pay | Admitting: Orthopaedic Surgery

## 2017-02-23 ENCOUNTER — Ambulatory Visit (INDEPENDENT_AMBULATORY_CARE_PROVIDER_SITE_OTHER): Payer: Federal, State, Local not specified - PPO

## 2017-02-23 ENCOUNTER — Ambulatory Visit (INDEPENDENT_AMBULATORY_CARE_PROVIDER_SITE_OTHER): Payer: Federal, State, Local not specified - PPO | Admitting: Orthopaedic Surgery

## 2017-02-23 DIAGNOSIS — Z96652 Presence of left artificial knee joint: Secondary | ICD-10-CM

## 2017-02-23 NOTE — Progress Notes (Signed)
Office Visit Note   Patient: [name]           Date of Birth: 12-20-1950           MRN: [id number] Visit Date: 02/23/2017              Requested by: Deland Pretty, MD [address] [place], [place] [address] PCP: Deland Pretty, MD   Assessment & Plan: Visit Diagnoses:  1. History of left knee replacement   2. Status post left partial knee replacement     Plan: As far as his knee goes the best thing he can do is work on quad strengthening exercises still as well as weight loss.  He seems to be doing well overall we had a long and thorough discussion about him following up as needed for that knee but if he developed any type of knee swelling as well as worsening problems with pain that does not go away I would need to see him back and get new x-rays.  All questions concerns were answered and addressed.  Follow-Up Instructions: Return if symptoms worsen or fail to improve.   Orders:  Orders Placed This Encounter  Procedures  . XR Knee 1-2 Views Left   No orders of the defined types were placed in this encounter.     Procedures: No procedures performed   Clinical Data: No additional findings.   Subjective: Chief Complaint  Patient presents with  . Left Knee - Follow-up  The patient is now one year out from a left total knee arthroplasty.  He does have some lateral knee pain he states it has been there since surgery.  It is more of a muscle type of pain.  He still has problems getting down on his knee but overall feels like he has made a lot of progress.  Denies any swelling.  HPI  Review of Systems He currently denies any acute changes in medical status.  He denies any headache, chest pain, shortness of breath, fever, chills, nausea, vomiting  Objective: Vital Signs: There were no vitals taken for this visit.  Physical Exam He is alert and oriented x3 and in no acute distress.  He does not walk with a limp on assistive device.  He is  moderately obese. Ortho Exam Examination of his left knee shows full range of motion.  Feels like mostly stable.  There is no effusion.  His incisions well-healed.  He does have some pain over the lateral posterior IT band and the lateral knee itself. Specialty Comments:  No specialty comments available.  Imaging: Xr Knee 1-2 Views Left  Result Date: 02/23/2017 2 views of the left knee are obtained today and compared to previous postoperative films.  He has a medial compartment unicompartmental knee arthroplasty.  There is no knee effusion.  There are no acute findings.  The implant does not appear loose.  There is residual calcification and likely bone cement in the back of the knee but this is not effective joint itself.  There is significant patellofemoral arthritic changes.    PMFS History: Patient Active Problem List   Diagnosis Date Noted  . Unilateral primary osteoarthritis, left knee 02/28/2016  . 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 Chest pain with moderate risk for cardiac etiology 06/19/2013  . HTN (hypertension) 06/19/2013  . HLD (hyperlipidemia) 06/19/2013  . Obesity 06/19/2013   Past Medical History:  Diagnosis Date  . A-fib ([place]) 2007   one occurrence ,  no issues following thryorid procedure   . Heart murmur    since age 32  . Hyperlipidemia   . Hypertension   . Hypothyroidism   . OSA on CPAP   . Thyroid disease    hyper "zapped"    Family History  Problem Relation Age of Onset  . Heart failure Father   . Heart disease Father   . Stroke Maternal Grandfather   . Heart disease Paternal Grandfather     Past Surgical History:  Procedure Laterality Date  . APPENDECTOMY    . KNEE SURGERY    . ligament stapled   1980s  . PARTIAL KNEE ARTHROPLASTY Left 02/28/2016   Procedure: LEFT UNICOMPARTMENTAL KNEE ARTHROPLASTY;  Surgeon: Mcarthur Rossetti, MD;  Location: [facility];  Service: Orthopedics;  Laterality: Left;   Social History    Occupational History  . Not on file  Tobacco Use  . Smoking status: Former Smoker    Types: Pipe  . Smokeless tobacco: Never Used  . Tobacco comment: quit 40 years ago  Substance and Sexual Activity  . Alcohol use: Yes    Comment: rarely , glass of wine once a year  . Drug use: No  . Sexual activity: Yes

## 2017-12-06 ENCOUNTER — Telehealth: Payer: Self-pay | Admitting: Cardiovascular Disease

## 2017-12-06 NOTE — Telephone Encounter (Signed)
Returned call to patient who is scheduled to see Dr. Gwenlyn Found as new patient on 11/20. He states he is trying to get insurance and had a pro-BNP test which was abnormal and his insurance requested that he have an echo & stress test. He is wanting to have these tests done prior to his appointment. Explained that we do not order tests on patients that we have not seen without an in office evaluation (last visit was 2015 for 1 visit).   Advised would notify primary nurse

## 2017-12-06 NOTE — Telephone Encounter (Signed)
  Patient wants to know if Dr Gwenlyn Found would want him to do a stress test before the appt. He would like to get that set up if possible.

## 2017-12-15 ENCOUNTER — Encounter: Payer: Self-pay | Admitting: Cardiovascular Disease

## 2017-12-15 ENCOUNTER — Other Ambulatory Visit (HOSPITAL_COMMUNITY): Payer: Self-pay | Admitting: Internal Medicine

## 2017-12-15 DIAGNOSIS — Z8679 Personal history of other diseases of the circulatory system: Secondary | ICD-10-CM

## 2017-12-17 ENCOUNTER — Other Ambulatory Visit (HOSPITAL_COMMUNITY): Payer: Federal, State, Local not specified - PPO

## 2017-12-20 ENCOUNTER — Other Ambulatory Visit: Payer: Self-pay

## 2017-12-20 ENCOUNTER — Ambulatory Visit (HOSPITAL_COMMUNITY): Payer: Federal, State, Local not specified - PPO | Attending: Cardiovascular Disease

## 2017-12-20 DIAGNOSIS — Z8679 Personal history of other diseases of the circulatory system: Secondary | ICD-10-CM | POA: Insufficient documentation

## 2017-12-22 ENCOUNTER — Encounter (INDEPENDENT_AMBULATORY_CARE_PROVIDER_SITE_OTHER): Payer: Self-pay | Admitting: Orthopaedic Surgery

## 2017-12-22 ENCOUNTER — Ambulatory Visit (INDEPENDENT_AMBULATORY_CARE_PROVIDER_SITE_OTHER): Payer: Federal, State, Local not specified - PPO

## 2017-12-22 ENCOUNTER — Ambulatory Visit (INDEPENDENT_AMBULATORY_CARE_PROVIDER_SITE_OTHER): Payer: Federal, State, Local not specified - PPO | Admitting: Orthopaedic Surgery

## 2017-12-22 ENCOUNTER — Telehealth (INDEPENDENT_AMBULATORY_CARE_PROVIDER_SITE_OTHER): Payer: Self-pay

## 2017-12-22 DIAGNOSIS — M25561 Pain in right knee: Secondary | ICD-10-CM | POA: Diagnosis not present

## 2017-12-22 DIAGNOSIS — M1711 Unilateral primary osteoarthritis, right knee: Secondary | ICD-10-CM

## 2017-12-22 MED ORDER — LIDOCAINE HCL 1 % IJ SOLN
3.0000 mL | INTRAMUSCULAR | Status: AC | PRN
  Administered 2017-12-22: 3 mL

## 2017-12-22 MED ORDER — METHYLPREDNISOLONE ACETATE 40 MG/ML IJ SUSP
40.0000 mg | INTRAMUSCULAR | Status: AC | PRN
  Administered 2017-12-22: 40 mg via INTRA_ARTICULAR

## 2017-12-22 NOTE — Progress Notes (Signed)
Office Visit Note   Patient: [name]           Date of Birth: 30-Jun-1950           MRN: [id number] Visit Date: 12/22/2017              Requested by: Deland Pretty, MD [address] [place], [place] [address] PCP: Deland Pretty, MD   Assessment & Plan: Visit Diagnoses:  1. Acute pain of right knee   2. Unilateral primary osteoarthritis, right knee     Plan: Given his recent onset of pain I do feel is appropriate to try a steroid injection in his knee today and then hyaluronic acid in 4 weeks from now.  He cannot take anti-inflammatories now that he is on a blood thinning medication and will try and avoid any type of surgery as well.  He tolerated steroid injection well in his knee today.  We will see him back in 4 weeks for hyaluronic acid for his right knee.  Follow-Up Instructions: Return in about 4 weeks (around 01/19/2018).   Orders:  Orders Placed This Encounter  Procedures  . Large Joint Inj  . XR Knee 1-2 Views Right   No orders of the defined types were placed in this encounter.     Procedures: Large Joint Inj: R knee on 12/22/2017 9:14 AM Indications: diagnostic evaluation and pain Details: 22 G 1.5 in needle, superolateral approach  Arthrogram: No  Medications: 3 mL lidocaine 1 %; 40 mg methylPREDNISolone acetate 40 MG/ML Outcome: tolerated well, no immediate complications Procedure, treatment alternatives, risks and benefits explained, specific risks discussed. Consent was given by the patient. Immediately prior to procedure a time out was called to verify the correct patient, procedure, equipment, support staff and site/side marked as required. Patient was prepped and draped in the usual sterile fashion.       Clinical Data: No additional findings.   Subjective: Chief Complaint  Patient presents with  . Right Knee - Pain  Patient is well-known to me.  We performed a left partial knee arthroplasty on several years ago.  His  right knee is been bothering him now.  He is 67 years old.  He has a previous history of a ligamentous reconstruction on that knee and looks like it was the medial collateral ligament.  He said it was the ACL but his incisions are on the medial aspect of his knee.  This was done about 30 years or more ago.  He points the medial joint line as a source of his pain.  He denies any locking catching but has been aching.  He has had a recent onset of atrial fibrillation and he is on blood thinning medication.  Gets more than normocephalic behind this today letter know  HPI  Review of Systems He currently denies any headache or chest pain or shortness of breath.  Objective: Vital Signs: There were no vitals taken for this visit.  Physical Exam He is alert and oriented x3 and in no acute distress Ortho Exam Examination of his right knee shows full range of motion.  His knee feels ligaments is stable.  He does have medial joint line tenderness. Specialty Comments:  No specialty comments available.  Imaging: Xr Knee 1-2 Views Right  Result Date: 12/22/2017 2 views of the right knee show slight medial joint space narrowing and slight varus malalignment.  There is patellofemoral arthritic changes.  There is retained hardware with a surgical staple in  the medial tibial plateau.    PMFS History: Patient Active Problem List   Diagnosis Date Noted  . Unilateral primary osteoarthritis, left knee 02/28/2016  . 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 Chest pain with moderate risk for cardiac etiology 06/19/2013  . HTN (hypertension) 06/19/2013  . HLD (hyperlipidemia) 06/19/2013  . Obesity 06/19/2013   Past Medical History:  Diagnosis Date  . A-fib ([place]) 2007   one occurrence , no issues following thryorid procedure   . Heart murmur    since age 39  . Hyperlipidemia   . Hypertension   . Hypothyroidism   . OSA on CPAP   . Thyroid disease    hyper "zapped"    Family History    Problem Relation Age of Onset  . Heart failure Father   . Heart disease Father   . Stroke Maternal Grandfather   . Heart disease Paternal Grandfather     Past Surgical History:  Procedure Laterality Date  . APPENDECTOMY    . KNEE SURGERY    . ligament stapled   1980s  . PARTIAL KNEE ARTHROPLASTY Left 02/28/2016   Procedure: LEFT UNICOMPARTMENTAL KNEE ARTHROPLASTY;  Surgeon: Mcarthur Rossetti, MD;  Location: [facility];  Service: Orthopedics;  Laterality: Left;   Social History   Occupational History  . Not on file  Tobacco Use  . Smoking status: Former Smoker    Types: Pipe  . Smokeless tobacco: Never Used  . Tobacco comment: quit 40 years ago  Substance and Sexual Activity  . Alcohol use: Yes    Comment: rarely , glass of wine once a year  . Drug use: No  . Sexual activity: Yes

## 2017-12-22 NOTE — Telephone Encounter (Signed)
Right knee gel injection  

## 2017-12-24 NOTE — Telephone Encounter (Signed)
Noted  

## 2017-12-28 ENCOUNTER — Telehealth (INDEPENDENT_AMBULATORY_CARE_PROVIDER_SITE_OTHER): Payer: Self-pay

## 2017-12-28 NOTE — Telephone Encounter (Signed)
Submitted VOB for SynviscOne, right knee. 

## 2017-12-29 ENCOUNTER — Ambulatory Visit: Payer: Federal, State, Local not specified - PPO | Admitting: Cardiovascular Disease

## 2017-12-29 ENCOUNTER — Encounter: Payer: Self-pay | Admitting: Cardiovascular Disease

## 2017-12-29 DIAGNOSIS — E7800 Pure hypercholesterolemia, unspecified: Secondary | ICD-10-CM | POA: Diagnosis not present

## 2017-12-29 DIAGNOSIS — I10 Essential (primary) hypertension: Secondary | ICD-10-CM | POA: Diagnosis not present

## 2017-12-29 DIAGNOSIS — I4819 Other persistent atrial fibrillation: Secondary | ICD-10-CM

## 2017-12-29 DIAGNOSIS — I480 Paroxysmal atrial fibrillation: Secondary | ICD-10-CM

## 2017-12-29 DIAGNOSIS — G4733 Obstructive sleep apnea (adult) (pediatric): Secondary | ICD-10-CM

## 2017-12-29 HISTORY — DX: Obstructive sleep apnea (adult) (pediatric): G47.33

## 2017-12-29 HISTORY — DX: Other persistent atrial fibrillation: I48.19

## 2017-12-29 NOTE — Assessment & Plan Note (Signed)
History of essential hypertension on amlodipine and Benicar/hydrochlorothiazide blood pressure measured today 132/62

## 2017-12-29 NOTE — Assessment & Plan Note (Signed)
History of hyperlipidemia not on statin therapy with a total cholesterol measured 12/70/18 of 197 with an LDL of 131.

## 2017-12-29 NOTE — Assessment & Plan Note (Signed)
History of obstructive sleep apnea on CPAP. 

## 2017-12-29 NOTE — Assessment & Plan Note (Signed)
She of PAF in the setting of severe hyperthyroidism back in 2007 status post radioactive thyroid ablation, and ultimate TEE guided DC cardioversion with restoration of sinus rhythm.  He thinks is been back in A. fib for the last several months EKG today that shows atrial fibrillation with a ventricular response of approximately 100.  Dr. Shelia Media began him on Eliquis 2 and half weeks ago.  I am going to arrange for him to undergo outpatient DC cardioversion in the next 2 weeks.  Recent 2D echo performed 12/20/2017 revealed normal LV systolic function with moderate left atrial dilatation.

## 2017-12-29 NOTE — Patient Instructions (Signed)
Medication Instructions:  Your physician recommends that you continue on your current medications as directed. Please refer to the Current Medication list given to you today.  If you need a refill on your cardiac medications before your next appointment, please call your pharmacy.   Lab work: Your physician recommends that you return for lab work in: 1 week before your procedure.  If you have labs (blood work) drawn today and your tests are completely normal, you will receive your results only by: Marland Kitchen MyChart Message (if you have MyChart) OR . A paper copy in the mail If you have any lab test that is abnormal or we need to change your treatment, we will call you to review the results.  Testing/Procedures: You are scheduled for a Cardioversion on Monday, 01/17/2018 - see letter for details.   Follow-Up: At [facility], you and your health needs are our priority.  As part of our continuing mission to provide you with exceptional heart care, we have created designated Provider Care Teams.  These Care Teams include your primary Cardiologist (physician) and Advanced Practice Providers (APPs -  Physician Assistants and Nurse Practitioners) who all work together to provide you with the care you need, when you need it. You will need a follow up appointment in 3 months.  Please call our office 2 months in advance to schedule this appointment.  You may see  Dr. Gwenlyn Found or one of the following Advanced Practice Providers on your designated Care Team:   Kerin Ransom, PA-C Roby Lofts, [place] . Sande Rives, PA-C  Any Other Special Instructions Will Be Listed Below (If Applicable).

## 2017-12-29 NOTE — Progress Notes (Signed)
12/29/2017 [name]   [birth year]  [id number]  Primary Physician Ian Pretty, MD Primary Cardiologist: Ian Harp MD [name], [place]  HPI:  [name] is a 67 y.o. married Caucasian male father of 3 daughters referred by [name] for cardiovascular evaluation because of new onset A. fib.  I actually took care of [name] 4 to 5 years ago and prior to that back in 2007.  He has a history of treated hypertension and untreated mild hyperlipidemia as well as family history of heart disease with maternal grandfather who had CABG in his 6s and a father who had a myocardial infarction in his 1s.  He had negative stress test in the past both in 2007 and again in 2015.  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  [name] follows his thyroid functions.  He is on thyroid replacement therapy.  He is recently been demonstrated to be in atrial fibrillation which he thinks is been in for several months and was begun on Eliquis oral anticoagulation.  He also has obstructive sleep apnea on CPAP.  He does drink ice tea several times a day decaffeinated coffee.  He does not drink alcohol.   Current Meds  Medication Sig  . amLODipine (NORVASC) 5 MG tablet TK 1 T PO QD  . apixaban (ELIQUIS) 5 MG TABS tablet Take 5 mg by mouth 2 (two) times daily.  Marland Kitchen levothyroxine (SYNTHROID, LEVOTHROID) 175 MCG tablet Take 175 mcg by mouth daily before breakfast.   . olmesartan-hydrochlorothiazide (BENICAR HCT) 40-25 MG tablet Take 1 tablet by mouth daily.  . sertraline (ZOLOFT) 100 MG tablet Take 50 mg by mouth daily.   Marland Kitchen testosterone cypionate (DEPOTESTOSTERONE CYPIONATE) 200 MG/ML injection   . [DISCONTINUED] telmisartan-hydrochlorothiazide (MICARDIS HCT) 80-25 MG tablet TK 1 T PO QD     Allergies  Allergen Reactions  . Penicillins Anaphylaxis    Pt reports throat swelling, difficulty  breathing at age 54 Has patient had a PCN reaction causing immediate rash, facial/tongue/throat swelling, SOB or lightheadedness with hypotension: yes Has patient had a PCN reaction causing severe rash involving mucus membranes or skin necrosis: no Has patient had a PCN reaction that required hospitalization no Has patient had a PCN reaction occurring within the last 10 years: no If all of the above answers are "NO", then may proceed with Cephalosporin use.     Social History   Socioeconomic History  . Marital status: Married    Spouse name: Not on file  . Number of children: Not on file  . Years of education: Not on file  . Highest education level: Not on file  Occupational History  . Not on file  Social Needs  . Financial resource strain: Not on file  . Food insecurity:    Worry: Not on file    Inability: Not on file  . Transportation needs:    Medical: Not on file    Non-medical: Not on file  Tobacco Use  . Smoking status: Former Smoker    Types: Pipe  . Smokeless tobacco: Never Used  . Tobacco comment: quit 40 years ago  Substance and Sexual Activity  . Alcohol use: Yes    Comment: rarely , glass of wine once a year  . Drug use: No  . Sexual activity: Yes  Lifestyle  . Physical activity:  Days per week: Not on file    Minutes per session: Not on file  . Stress: Not on file  Relationships  . Social connections:    Talks on phone: Not on file    Gets together: Not on file    Attends religious service: Not on file    Active member of club or organization: Not on file    Attends meetings of clubs or organizations: Not on file    Relationship status: Not on file  . Intimate partner violence:    Fear of current or ex partner: Not on file    Emotionally abused: Not on file    Physically abused: Not on file    Forced sexual activity: Not on file  Other Topics Concern  . Not on file  Social History Narrative  . Not on file     Review of Systems: General:  negative for chills, fever, night sweats or weight changes.  Cardiovascular: negative for chest pain, dyspnea on exertion, edema, orthopnea, palpitations, paroxysmal nocturnal dyspnea or shortness of breath Dermatological: negative for rash Respiratory: negative for cough or wheezing Urologic: negative for hematuria Abdominal: negative for nausea, vomiting, diarrhea, bright red blood per rectum, melena, or hematemesis Neurologic: negative for visual changes, syncope, or dizziness All other systems reviewed and are otherwise negative except as noted above.    Height 5' 9.5" (1.765 m), weight 258 lb 9.6 oz (117.3 kg).  General appearance: alert and no distress Neck: no adenopathy, no carotid bruit, no JVD, supple, symmetrical, trachea midline and thyroid not enlarged, symmetric, no tenderness/mass/nodules Lungs: clear to auscultation bilaterally Heart: irregularly irregular rhythm Extremities: extremities normal, atraumatic, no cyanosis or edema Pulses: 2+ and symmetric Skin: Skin color, texture, turgor normal. No rashes or lesions Neurologic: Alert and oriented X 3, normal strength and tone. Normal symmetric reflexes. Normal coordination and gait  EKG atrial fibrillation with a ventricular response of 108.  Personally reviewed this EKG  ASSESSMENT AND PLAN:   HTN (hypertension) History of essential hypertension on amlodipine and Benicar/hydrochlorothiazide blood pressure measured today 132/62  HLD (hyperlipidemia) History of hyperlipidemia not on statin therapy with a total cholesterol measured 12/70/18 of 197 with an LDL of 131.  Obstructive sleep apnea History of obstructive sleep apnea on CPAP  Paroxysmal atrial fibrillation ([place]) She of PAF in the setting of severe hyperthyroidism back in 2007 status post radioactive thyroid ablation, and ultimate TEE guided DC cardioversion with restoration of sinus rhythm.  He thinks is been back in A. fib for the last several months EKG today  that shows atrial fibrillation with a ventricular response of approximately 100.  [name] began him on Eliquis 2 and half weeks ago.  I am going to arrange for him to undergo outpatient DC cardioversion in the next 2 weeks.  Recent 2D echo performed 12/20/2017 revealed normal LV systolic function with moderate left atrial dilatation.      Ian Harp MD FACP,FACC,FAHA, [facility] 12/29/2017 10:11 AM

## 2018-01-04 ENCOUNTER — Telehealth (INDEPENDENT_AMBULATORY_CARE_PROVIDER_SITE_OTHER): Payer: Self-pay

## 2018-01-04 NOTE — Telephone Encounter (Signed)
Patient is approved for SynviscOne, right knee. [place] Patient will be responsible 15% OOP. Possible Co-pay of $35.00 No PA required  Appt. 01/19/2018

## 2018-01-10 ENCOUNTER — Other Ambulatory Visit: Payer: Self-pay | Admitting: *Deleted

## 2018-01-10 DIAGNOSIS — I480 Paroxysmal atrial fibrillation: Secondary | ICD-10-CM

## 2018-01-12 LAB — BASIC METABOLIC PANEL
BUN / CREAT RATIO: 13 (ref 10–24)
BUN: 13 mg/dL (ref 8–27)
CALCIUM: 9.2 mg/dL (ref 8.6–10.2)
CHLORIDE: 99 mmol/L (ref 96–106)
CO2: 25 mmol/L (ref 20–29)
Creatinine, Ser: 1.03 mg/dL (ref 0.76–1.27)
GFR calc non Af Amer: 74 mL/min/{1.73_m2} (ref >59)
GFR, EST AFRICAN AMERICAN: 86 mL/min/{1.73_m2} (ref >59)
Glucose: 92 mg/dL (ref 65–99)
POTASSIUM: 4.8 mmol/L (ref 3.5–5.2)
Sodium: 138 mmol/L (ref 134–144)

## 2018-01-12 LAB — CBC
HEMATOCRIT: 52.6 % — AB (ref 37.5–51.0)
Hemoglobin: 18.2 g/dL — ABNORMAL HIGH (ref 13.0–17.7)
MCH: 30.5 pg (ref 26.6–33.0)
MCHC: 34.6 g/dL (ref 31.5–35.7)
MCV: 88 fL (ref 79–97)
Platelets: 251 10*3/uL (ref 150–450)
RBC: 5.97 x10E6/uL — ABNORMAL HIGH (ref 4.14–5.80)
RDW: 13.3 % (ref 12.3–15.4)
WBC: 6.8 10*3/uL (ref 3.4–10.8)

## 2018-01-16 NOTE — Anesthesia Preprocedure Evaluation (Addendum)
Anesthesia Evaluation  Patient identified by MRN, date of birth, ID band Patient awake    Reviewed: Allergy & Precautions, NPO status , Patient's Chart, lab work & pertinent test results  History of Anesthesia Complications Negative for: history of anesthetic complications  Airway Mallampati: II  TM Distance: >3 FB Neck ROM: Full    Dental no notable dental hx. (+) Dental Advisory Given   Pulmonary sleep apnea and Continuous Positive Airway Pressure Ventilation , former smoker,    Pulmonary exam normal        Cardiovascular hypertension, negative cardio ROS   Rhythm:Irregular Rate:Normal     Neuro/Psych negative neurological ROS     GI/Hepatic negative GI ROS, Neg liver ROS,   Endo/Other  Hypothyroidism   Renal/GU negative Renal ROS     Musculoskeletal negative musculoskeletal ROS (+)   Abdominal   Peds  Hematology negative hematology ROS (+)   Anesthesia Other Findings Day of surgery medications reviewed with the patient.  Reproductive/Obstetrics                           Anesthesia Physical Anesthesia Plan  ASA: III  Anesthesia Plan: General   Post-op Pain Management:    Induction: Intravenous  PONV Risk Score and Plan: Propofol infusion and Treatment may vary due to age or medical condition  Airway Management Planned: Mask  Additional Equipment:   Intra-op Plan:   Post-operative Plan:   Informed Consent: I have reviewed the patients History and Physical, chart, labs and discussed the procedure including the risks, benefits and alternatives for the proposed anesthesia with the patient or authorized representative who has indicated his/her understanding and acceptance.   Dental advisory given  Plan Discussed with: CRNA and Anesthesiologist  Anesthesia Plan Comments:        Anesthesia Quick Evaluation

## 2018-01-17 ENCOUNTER — Encounter (HOSPITAL_COMMUNITY)
Admission: RE | Disposition: A | Discharge status: Home / Self Care | Payer: Self-pay | Source: Ambulatory Visit | Attending: Internal Medicine

## 2018-01-17 ENCOUNTER — Ambulatory Visit (HOSPITAL_COMMUNITY)
Admission: RE | Admit: 2018-01-17 | Discharge: 2018-01-17 | Disposition: A | Discharge status: Home / Self Care | Payer: Federal, State, Local not specified - PPO | Source: Ambulatory Visit | Attending: Internal Medicine | Admitting: Internal Medicine

## 2018-01-17 ENCOUNTER — Ambulatory Visit (HOSPITAL_COMMUNITY): Payer: Federal, State, Local not specified - PPO | Admitting: Anesthesiology

## 2018-01-17 ENCOUNTER — Encounter (HOSPITAL_COMMUNITY): Payer: Self-pay | Admitting: *Deleted

## 2018-01-17 DIAGNOSIS — Z79899 Other long term (current) drug therapy: Secondary | ICD-10-CM | POA: Diagnosis not present

## 2018-01-17 DIAGNOSIS — I4891 Unspecified atrial fibrillation: Secondary | ICD-10-CM | POA: Diagnosis not present

## 2018-01-17 DIAGNOSIS — I10 Essential (primary) hypertension: Secondary | ICD-10-CM | POA: Insufficient documentation

## 2018-01-17 DIAGNOSIS — I480 Paroxysmal atrial fibrillation: Secondary | ICD-10-CM

## 2018-01-17 DIAGNOSIS — G4733 Obstructive sleep apnea (adult) (pediatric): Secondary | ICD-10-CM | POA: Insufficient documentation

## 2018-01-17 DIAGNOSIS — M1712 Unilateral primary osteoarthritis, left knee: Secondary | ICD-10-CM | POA: Insufficient documentation

## 2018-01-17 DIAGNOSIS — Z880 Allergy status to penicillin: Secondary | ICD-10-CM | POA: Diagnosis not present

## 2018-01-17 DIAGNOSIS — E785 Hyperlipidemia, unspecified: Secondary | ICD-10-CM | POA: Diagnosis not present

## 2018-01-17 DIAGNOSIS — Z87891 Personal history of nicotine dependence: Secondary | ICD-10-CM | POA: Insufficient documentation

## 2018-01-17 DIAGNOSIS — Z96652 Presence of left artificial knee joint: Secondary | ICD-10-CM | POA: Diagnosis not present

## 2018-01-17 DIAGNOSIS — E039 Hypothyroidism, unspecified: Secondary | ICD-10-CM | POA: Diagnosis not present

## 2018-01-17 DIAGNOSIS — Z7901 Long term (current) use of anticoagulants: Secondary | ICD-10-CM | POA: Insufficient documentation

## 2018-01-17 DIAGNOSIS — Z6837 Body mass index (BMI) 37.0-37.9, adult: Secondary | ICD-10-CM | POA: Insufficient documentation

## 2018-01-17 HISTORY — PX: CARDIOVERSION: SHX1299

## 2018-01-17 SURGERY — CARDIOVERSION
Anesthesia: General

## 2018-01-17 MED ORDER — SODIUM CHLORIDE 0.9 % IV SOLN: 250.0000 mL | INTRAVENOUS | Status: DC

## 2018-01-17 MED ORDER — SODIUM CHLORIDE 0.9 % IV SOLN
INTRAVENOUS | Status: DC | PRN
  Administered 2018-01-17: 08:00:00 via INTRAVENOUS

## 2018-01-17 MED ORDER — PROPOFOL 10 MG/ML IV BOLUS
INTRAVENOUS | Status: DC | PRN
  Administered 2018-01-17: 100 mg via INTRAVENOUS

## 2018-01-17 MED ORDER — LIDOCAINE HCL (CARDIAC) PF 100 MG/5ML IV SOSY
PREFILLED_SYRINGE | INTRAVENOUS | Status: DC | PRN
  Administered 2018-01-17: 100 mg via INTRAVENOUS

## 2018-01-17 MED ORDER — SODIUM CHLORIDE 0.9% FLUSH: 3.0000 mL | INTRAVENOUS | Status: DC | PRN

## 2018-01-17 MED ORDER — SODIUM CHLORIDE 0.9% FLUSH: 3.0000 mL | Freq: Two times a day (BID) | INTRAVENOUS | Status: DC

## 2018-01-17 NOTE — CV Procedure (Signed)
   CARDIOVERSION NOTE  Procedure: Electrical Cardioversion Indications:  Atrial Fibrillation  Procedure Details:  Consent: Risks of procedure as well as the alternatives and risks of each were explained to the (patient/caregiver).  Consent for procedure obtained.  Time Out: Verified patient identification, verified procedure, site/side was marked, verified correct patient position, special equipment/implants available, medications/allergies/relevent history reviewed, required imaging and test results available.  Performed  Patient placed on cardiac monitor, pulse oximetry, supplemental oxygen as necessary.  Sedation given: Propfol per anesthesia Pacer pads placed anterior and posterior chest.  Cardioverted 1 time(s).  Cardioverted at 150J biphasic.  Impression: Findings: Post procedure EKG shows: NSR Complications: None Patient did tolerate procedure well.  Plan: 1. Successful DCCV to NSR with a single 150J biphasic shock.  Time Spent Directly with the Patient:  30 minutes   Pixie Casino, MD, [facility], [place] Director of the Advanced Lipid Disorders &  Cardiovascular Risk Reduction Clinic Diplomate of the American Board of Clinical Lipidology Attending Cardiologist  Direct Dial: [phone]  Fax: [phone]  Website:  [email or web address] Ciano 01/17/2018, 8:04 AM

## 2018-01-17 NOTE — Discharge Instructions (Signed)
Electrical Cardioversion, Care After °This sheet gives you information about how to care for yourself after your procedure. Your health care provider may also give you more specific instructions. If you have problems or questions, contact your health care provider. °What can I expect after the procedure? °After the procedure, it is common to have: °· Some redness on the skin where the shocks were given. ° °Follow these instructions at home: °· Do not drive for 24 hours if you were given a medicine to help you relax (sedative). °· Take over-the-counter and prescription medicines only as told by your health care provider. °· Ask your health care provider how to check your pulse. Check it often. °· Rest for 48 hours after the procedure or as told by your health care provider. °· Avoid or limit your caffeine use as told by your health care provider. °Contact a health care provider if: °· You feel like your heart is beating too quickly or your pulse is not regular. °· You have a serious muscle cramp that does not go away. °Get help right away if: °· You have discomfort in your chest. °· You are dizzy or you feel faint. °· You have trouble breathing or you are short of breath. °· Your speech is slurred. °· You have trouble moving an arm or leg on one side of your body. °· Your fingers or toes turn cold or blue. °This information is not intended to replace advice given to you by your health care provider. Make sure you discuss any questions you have with your health care provider. °Document Released: 11/16/2012 Document Revised: 08/30/2015 Document Reviewed: 08/02/2015 °Elsevier Interactive Patient Education © 2018 [employer] ° °

## 2018-01-17 NOTE — H&P (Signed)
   INTERVAL PROCEDURE H&P  History and Physical Interval Note:  01/17/2018 7:31 AM  [name] has presented today for their planned procedure. The various methods of treatment have been discussed with the patient and family. After consideration of risks, benefits and other options for treatment, the patient has consented to the procedure.  The patients' outpatient history has been reviewed, patient examined, and no change in status from most recent office note within the past 30 days. I have reviewed the patients' chart and labs and will proceed as planned. Questions were answered to the patient's satisfaction.   Pixie Casino, MD, [facility], [place] Director of the Advanced Lipid Disorders &  Cardiovascular Risk Reduction Clinic Diplomate of the American Board of Clinical Lipidology Attending Cardiologist  Direct Dial: [phone]  Fax: [phone]  Website:  [name] [name] 01/17/2018, 7:31 AM

## 2018-01-17 NOTE — Transfer of Care (Signed)
Immediate Anesthesia Transfer of Care Note  Patient: [name]  Procedure(s) Performed: CARDIOVERSION (N/A )  Patient Location: Endoscopy Unit  Anesthesia Type:General  Level of Consciousness: patient cooperative and responds to stimulation  Airway & Oxygen Therapy: Patient Spontanous Breathing  Post-op Assessment: Report given to RN and Post -op Vital signs reviewed and stable  Post vital signs: Reviewed and stable  Last Vitals:  Vitals Value Taken Time  BP    Temp    Pulse    Resp    SpO2      Last Pain:  Vitals:   01/17/18 0635  TempSrc: (P) Oral  PainSc: (P) 0-No pain         Complications: No apparent anesthesia complications

## 2018-01-17 NOTE — Anesthesia Postprocedure Evaluation (Signed)
Anesthesia Post Note  Patient: Lamondre R Sadowski  Procedure(s) Performed: CARDIOVERSION (N/A )     Patient location during evaluation: PACU Anesthesia Type: General Level of consciousness: sedated Pain management: pain level controlled Vital Signs Assessment: post-procedure vital signs reviewed and stable Respiratory status: spontaneous breathing and respiratory function stable Cardiovascular status: stable Postop Assessment: no apparent nausea or vomiting Anesthetic complications: no    Last Vitals:  Vitals:   01/17/18 0755 01/17/18 0805  BP: 123/82 126/79  Pulse: 75 71  Resp: 10 19  Temp:    SpO2: 96% 96%    Last Pain:  Vitals:   01/17/18 0805  TempSrc:   PainSc: 0-No pain                 Jasma Seevers DANIEL

## 2018-01-19 ENCOUNTER — Ambulatory Visit (INDEPENDENT_AMBULATORY_CARE_PROVIDER_SITE_OTHER): Payer: Federal, State, Local not specified - PPO | Admitting: Orthopaedic Surgery

## 2018-01-19 ENCOUNTER — Encounter (INDEPENDENT_AMBULATORY_CARE_PROVIDER_SITE_OTHER): Payer: Self-pay | Admitting: Orthopaedic Surgery

## 2018-01-19 DIAGNOSIS — M1711 Unilateral primary osteoarthritis, right knee: Secondary | ICD-10-CM

## 2018-01-19 MED ORDER — HYLAN G-F 20 48 MG/6ML IX SOSY
48.0000 mg | PREFILLED_SYRINGE | INTRA_ARTICULAR | Status: AC | PRN
  Administered 2018-01-19: 48 mg via INTRA_ARTICULAR

## 2018-01-19 NOTE — Progress Notes (Signed)
   Procedure Note  Patient: [name]             Date of Birth: 03-16-1950           MRN: [id number]             Visit Date: 01/19/2018  Procedures: Visit Diagnoses: Unilateral primary osteoarthritis, right knee  Large Joint Inj: R knee on 01/19/2018 8:45 AM Indications: pain and diagnostic evaluation Details: 22 G 1.5 in needle, superolateral approach  Arthrogram: No  Medications: 48 mg Hylan 48 MG/6ML Outcome: tolerated well, no immediate complications Procedure, treatment alternatives, risks and benefits explained, specific risks discussed. Consent was given by the patient. Immediately prior to procedure a time out was called to verify the correct patient, procedure, equipment, support staff and site/side marked as required. Patient was prepped and draped in the usual sterile fashion.    The patient is here today for scheduled hyaluronic acid injection with Synvisc 1 into the right knee to treat the pain from osteoarthritis.  We had a long thorough discussion about the injection and the rationale behind it and how the injection hopefully works.  Risk and benefits were discussed in detail.  The patient is been having mainly medial joint line tenderness with known osteoarthritic findings in that medial compartment.  He tolerated the Synvisc 1 injection well in his right knee.  All question concerns were answered and addressed.  Follow-up at this point will be as needed.

## 2018-01-28 ENCOUNTER — Other Ambulatory Visit: Payer: Self-pay | Admitting: *Deleted

## 2018-01-28 DIAGNOSIS — I480 Paroxysmal atrial fibrillation: Secondary | ICD-10-CM

## 2018-02-04 ENCOUNTER — Ambulatory Visit (INDEPENDENT_AMBULATORY_CARE_PROVIDER_SITE_OTHER): Payer: Federal, State, Local not specified - PPO

## 2018-02-04 DIAGNOSIS — I480 Paroxysmal atrial fibrillation: Secondary | ICD-10-CM | POA: Diagnosis not present

## 2018-02-04 LAB — EXERCISE TOLERANCE TEST
CSEPHR: 86 %
CSEPPHR: 131 {beats}/min
Estimated workload: 7 METS
Exercise duration (min): 6 min
Exercise duration (sec): 0 s
MPHR: 152 {beats}/min
RPE: 17
Rest HR: 65 {beats}/min

## 2018-03-11 ENCOUNTER — Other Ambulatory Visit: Payer: Self-pay | Admitting: Surgery

## 2018-03-11 ENCOUNTER — Telehealth: Payer: Self-pay | Admitting: *Deleted

## 2018-03-11 NOTE — Telephone Encounter (Signed)
   [facility] Pre-operative Risk Assessment    Request for surgical clearance:  1. What type of surgery is being performed? Excision of posterior neck cyst   2. When is this surgery scheduled? TBD   3. What type of clearance is required (medical clearance vs. Pharmacy clearance to hold med vs. Both)? both  4. Are there any medications that need to be held prior to surgery and how long?eliquis-need recommendations   5. Practice name and name of physician performing surgery? [facility]   6. What is your office phone number [phone]    7.   What is your office fax number [phone]  8.   Anesthesia type (None, local, MAC, general) ? general   Fredia Beets 03/11/2018, 3:44 PM  _________________________________________________________________   (provider comments below)

## 2018-03-16 NOTE — Telephone Encounter (Signed)
Please address eliquis thanks 

## 2018-03-17 ENCOUNTER — Telehealth: Payer: Self-pay | Admitting: Cardiology

## 2018-03-17 NOTE — Telephone Encounter (Signed)
Pt takes Eliquis for afib with CHADS2VASc score of 2 (age, HTN). He underwent cardioversion on 01/17/18 and is outside his 30 day post-op window of uninterrupted anticoagulation. Renal function is normal. Ok to hold Eliquis for 1-2 days prior to procedure.

## 2018-03-17 NOTE — Telephone Encounter (Signed)
   Primary Cardiologist: No primary care provider on file.  Chart reviewed as part of pre-operative protocol coverage. [name] was contacted 03/17/2018 in reference to pre-operative risk assessment for pending surgery as outlined below.  Breton R Kiraly was last seen on 12/29/17 by Dr. Gwenlyn Found.  Since that day, [name] has done well with cardioversion on 01/17/18, and neg stress test in December 2019 as well.  May hold eliquis 1-2 days prior to procedure and resume post op.    Therefore, based on ACC/AHA guidelines, the [name] would be at acceptable risk for the planned procedure without further cardiovascular testing.   I will route this recommendation to the requesting party via Epic fax function and remove from pre-op pool.  Please call with questions.  Cecilie Kicks, NP 03/17/2018, 9:43 AM

## 2018-03-17 NOTE — Telephone Encounter (Signed)
On call to pt for pre-op clearance he was concerned that his insurance company is charging higher rates due to BP spike with ETT and moderately dilated LA.   He will bring papers to you today or tomorrow.

## 2018-04-05 ENCOUNTER — Encounter (HOSPITAL_BASED_OUTPATIENT_CLINIC_OR_DEPARTMENT_OTHER)
Admission: RE | Admit: 2018-04-05 | Discharge: 2018-04-05 | Disposition: A | Discharge status: Home / Self Care | Payer: Federal, State, Local not specified - PPO | Source: Ambulatory Visit | Attending: Surgery | Admitting: Surgery

## 2018-04-05 ENCOUNTER — Encounter (HOSPITAL_BASED_OUTPATIENT_CLINIC_OR_DEPARTMENT_OTHER): Payer: Self-pay | Admitting: *Deleted

## 2018-04-05 ENCOUNTER — Other Ambulatory Visit: Payer: Self-pay

## 2018-04-05 DIAGNOSIS — Z01812 Encounter for preprocedural laboratory examination: Secondary | ICD-10-CM | POA: Diagnosis not present

## 2018-04-05 LAB — BASIC METABOLIC PANEL
Anion gap: 4 — ABNORMAL LOW (ref 5–15)
BUN: 13 mg/dL (ref 8–23)
CO2: 31 mmol/L (ref 22–32)
Calcium: 9.1 mg/dL (ref 8.9–10.3)
Chloride: 103 mmol/L (ref 98–111)
Creatinine, Ser: 0.88 mg/dL (ref 0.61–1.24)
GFR calc Af Amer: >60 mL/min (ref >60)
GFR calc non Af Amer: >60 mL/min (ref >60)
GLUCOSE: 94 mg/dL (ref 70–99)
Potassium: 4.1 mmol/L (ref 3.5–5.1)
Sodium: 138 mmol/L (ref 135–145)

## 2018-04-05 NOTE — Progress Notes (Signed)
Pt is coming today for BMET , said if he could get off the phone he would come now. Explained for him to bring all medications and CPAP. He said he would not worry about taking his medications until he gets home. Told him to bring with him day of surgery in case he needed them or had to stay for any reason.

## 2018-04-05 NOTE — Progress Notes (Signed)
Ensure pre surgery drink given with instructions to complete by 0400 dos,surgical soap given with instructions,  pt verbalized understanding. 

## 2018-04-10 ENCOUNTER — Encounter (HOSPITAL_BASED_OUTPATIENT_CLINIC_OR_DEPARTMENT_OTHER): Payer: Self-pay | Admitting: Anesthesiology

## 2018-04-10 NOTE — Anesthesia Preprocedure Evaluation (Addendum)
Anesthesia Evaluation  Patient identified by MRN, date of birth, ID band Patient awake    Reviewed: Allergy & Precautions, NPO status , Patient's Chart, lab work & pertinent test results  Airway Mallampati: III  TM Distance: >3 FB Neck ROM: Full    Dental no notable dental hx. (+) Teeth Intact   Pulmonary sleep apnea and Continuous Positive Airway Pressure Ventilation , former smoker,    Pulmonary exam normal breath sounds clear to auscultation       Cardiovascular hypertension, Normal cardiovascular exam+ dysrhythmias Atrial Fibrillation + Valvular Problems/Murmurs  Rhythm:Regular Rate:Normal     Neuro/Psych Depression negative neurological ROS     GI/Hepatic negative GI ROS, Neg liver ROS,   Endo/Other  Hypothyroidism Morbid obesity  Renal/GU negative Renal ROS  negative genitourinary   Musculoskeletal  (+) Arthritis , Osteoarthritis,  Chronic infected sebaceous cyst posterior neck   Abdominal (+) + obese,   Peds  Hematology   Anesthesia Other Findings   Reproductive/Obstetrics                            Anesthesia Physical Anesthesia Plan  ASA: III  Anesthesia Plan: General   Post-op Pain Management:    Induction: Intravenous  PONV Risk Score and Plan: 3 and Ondansetron, Dexamethasone and Treatment may vary due to age or medical condition  Airway Management Planned: Oral ETT  Additional Equipment:   Intra-op Plan:   Post-operative Plan: Extubation in OR  Informed Consent: I have reviewed the patients History and Physical, chart, labs and discussed the procedure including the risks, benefits and alternatives for the proposed anesthesia with the patient or authorized representative who has indicated his/her understanding and acceptance.     Dental advisory given  Plan Discussed with: CRNA and Surgeon  Anesthesia Plan Comments:        Anesthesia Quick Evaluation

## 2018-04-10 NOTE — H&P (Signed)
  [name]  Location: [facility] Patient #: [id number] DOB: October 20, 1950 Married / Language: English / Race: White Male   History of Present Illness  The patient is a 68 year old male who presents with skin lesions. This gentleman has been seen in our office before with an infected sebaceous cyst. He has had multiple infections of the sebaceous cyst on his posterior neck at the hairline. He has had multiple drainage procedures in the past well. He is interested in surgical excision of this area.  PMx: HTN HLD Afib  Allergies  Penicillins  Allergies Reconciled   Medication History ( Benicar HCT (40-25MG  Tablet, Oral) Active. Ocuvite Adult 50+ (Oral) Active. Omega-3 (1000MG  Capsule, Oral) Active. Prevagen (10MG  Capsule, Oral) Active. PriLOSEC (20MG  Capsule DR, Oral) Active. Anti-Inflammatory Enzyme (Oral) Active. Levothyroxine Sodium (175MCG Tablet, Oral) Active. AmLODIPine Besylate (5MG  Tablet, Oral) Active. Sertraline HCl (100MG  Tablet, Oral) Active. Sulfamethoxazole-Trimethoprim (800-160MG  Tablet, Oral) Active. Testosterone Cypionate (200MG /ML Solution, Intramuscular) Active. Eliquis (5MG  Tablet, Oral) Active. Medications Reconciled  Vitals  Weight: 261.4 lb Height: 69.5in Body Surface Area: 2.33 m Body Mass Index: 38.05 kg/m  Temp.: 98.16F  Pulse: 81 (Regular)  BP: 126/84 (Sitting, Left Arm, Standard)   Physical Exam   The physical exam findings are as follows: Note:Today he is well in appearance Lungs are clear bilaterally Cardiovascular is regular rate and rhythm Abdomen soft NT Ext normal There are chronic skin changes on his neck from the infected sebaceous cyst. This is at the posterior midline.    Assessment & Plan   INFECTED SEBACEOUS CYST (L72.3)  Impression: Because of his repeated infections and need for anticoagulation with intermittent history of A. fib, surgical excision of the chronic infected  sebaceous cyst on the back of his neck is highly recommended. He wants to proceed with surgery. I discussed the risk which includes but is not limited to bleeding, infection, recurrence, etc. He will need to stop his anticoagulation medications 2 days preoperatively. We will get cardiac clearance as well.

## 2018-04-11 ENCOUNTER — Encounter (HOSPITAL_BASED_OUTPATIENT_CLINIC_OR_DEPARTMENT_OTHER)
Admission: RE | Disposition: A | Discharge status: Home / Self Care | Payer: Self-pay | Source: Home / Self Care | Attending: Surgery

## 2018-04-11 ENCOUNTER — Ambulatory Visit (HOSPITAL_BASED_OUTPATIENT_CLINIC_OR_DEPARTMENT_OTHER): Payer: Federal, State, Local not specified - PPO | Admitting: Anesthesiology

## 2018-04-11 ENCOUNTER — Ambulatory Visit (HOSPITAL_BASED_OUTPATIENT_CLINIC_OR_DEPARTMENT_OTHER)
Admission: RE | Admit: 2018-04-11 | Discharge: 2018-04-11 | Disposition: A | Discharge status: Home / Self Care | Payer: Federal, State, Local not specified - PPO | Attending: Surgery | Admitting: Surgery

## 2018-04-11 ENCOUNTER — Encounter (HOSPITAL_BASED_OUTPATIENT_CLINIC_OR_DEPARTMENT_OTHER): Payer: Self-pay

## 2018-04-11 ENCOUNTER — Other Ambulatory Visit: Payer: Self-pay

## 2018-04-11 DIAGNOSIS — Z79899 Other long term (current) drug therapy: Secondary | ICD-10-CM | POA: Diagnosis not present

## 2018-04-11 DIAGNOSIS — G4730 Sleep apnea, unspecified: Secondary | ICD-10-CM | POA: Insufficient documentation

## 2018-04-11 DIAGNOSIS — I10 Essential (primary) hypertension: Secondary | ICD-10-CM | POA: Insufficient documentation

## 2018-04-11 DIAGNOSIS — Z6837 Body mass index (BMI) 37.0-37.9, adult: Secondary | ICD-10-CM | POA: Diagnosis not present

## 2018-04-11 DIAGNOSIS — I4891 Unspecified atrial fibrillation: Secondary | ICD-10-CM | POA: Diagnosis not present

## 2018-04-11 DIAGNOSIS — Z880 Allergy status to penicillin: Secondary | ICD-10-CM | POA: Diagnosis not present

## 2018-04-11 DIAGNOSIS — M1990 Unspecified osteoarthritis, unspecified site: Secondary | ICD-10-CM | POA: Insufficient documentation

## 2018-04-11 DIAGNOSIS — F329 Major depressive disorder, single episode, unspecified: Secondary | ICD-10-CM | POA: Insufficient documentation

## 2018-04-11 DIAGNOSIS — L723 Sebaceous cyst: Secondary | ICD-10-CM | POA: Insufficient documentation

## 2018-04-11 DIAGNOSIS — L905 Scar conditions and fibrosis of skin: Secondary | ICD-10-CM | POA: Diagnosis not present

## 2018-04-11 DIAGNOSIS — Z7901 Long term (current) use of anticoagulants: Secondary | ICD-10-CM | POA: Diagnosis not present

## 2018-04-11 DIAGNOSIS — Z87891 Personal history of nicotine dependence: Secondary | ICD-10-CM | POA: Diagnosis not present

## 2018-04-11 DIAGNOSIS — Z79890 Hormone replacement therapy (postmenopausal): Secondary | ICD-10-CM | POA: Insufficient documentation

## 2018-04-11 DIAGNOSIS — Z792 Long term (current) use of antibiotics: Secondary | ICD-10-CM | POA: Insufficient documentation

## 2018-04-11 HISTORY — PX: CYST REMOVAL NECK: SHX6281

## 2018-04-11 SURGERY — EXCISION, CYST, NECK
Anesthesia: General | Site: Neck

## 2018-04-11 MED ORDER — CIPROFLOXACIN IN D5W 400 MG/200ML IV SOLN: 400.0000 mg | INTRAVENOUS | Status: DC

## 2018-04-11 MED ORDER — MEPERIDINE HCL 25 MG/ML IJ SOLN: 6.2500 mg | INTRAMUSCULAR | Status: DC | PRN

## 2018-04-11 MED ORDER — CIPROFLOXACIN IN D5W 400 MG/200ML IV SOLN
INTRAVENOUS | Status: AC
  Filled 2018-04-11: qty 200

## 2018-04-11 MED ORDER — SCOPOLAMINE 1 MG/3DAYS TD PT72: 1.0000 | MEDICATED_PATCH | Freq: Once | TRANSDERMAL | Status: DC | PRN

## 2018-04-11 MED ORDER — ONDANSETRON HCL 4 MG/2ML IJ SOLN
INTRAMUSCULAR | Status: DC | PRN
  Administered 2018-04-11: 4 mg via INTRAVENOUS

## 2018-04-11 MED ORDER — ACETAMINOPHEN 500 MG PO TABS
ORAL_TABLET | ORAL | Status: AC
  Filled 2018-04-11: qty 2

## 2018-04-11 MED ORDER — EPHEDRINE SULFATE 50 MG/ML IJ SOLN
INTRAMUSCULAR | Status: DC | PRN
  Administered 2018-04-11 (×2): 10 mg via INTRAVENOUS
  Administered 2018-04-11: 15 mg via INTRAVENOUS
  Administered 2018-04-11: 20 mg via INTRAVENOUS

## 2018-04-11 MED ORDER — CHLORHEXIDINE GLUCONATE CLOTH 2 % EX PADS: 6.0000 | MEDICATED_PAD | Freq: Once | CUTANEOUS | Status: DC

## 2018-04-11 MED ORDER — PROPOFOL 10 MG/ML IV BOLUS
INTRAVENOUS | Status: DC | PRN
  Administered 2018-04-11: 200 mg via INTRAVENOUS

## 2018-04-11 MED ORDER — MIDAZOLAM HCL 2 MG/2ML IJ SOLN: 1.0000 mg | INTRAMUSCULAR | Status: DC | PRN

## 2018-04-11 MED ORDER — LACTATED RINGERS IV SOLN
INTRAVENOUS | Status: DC
  Administered 2018-04-11 (×2): via INTRAVENOUS

## 2018-04-11 MED ORDER — LIDOCAINE-EPINEPHRINE 1 %-1:100000 IJ SOLN
INTRAMUSCULAR | Status: AC
  Filled 2018-04-11: qty 1

## 2018-04-11 MED ORDER — TRAMADOL HCL 50 MG PO TABS: 50.0000 mg | ORAL_TABLET | Freq: Four times a day (QID) | ORAL | 1 refills | Status: DC | PRN

## 2018-04-11 MED ORDER — GABAPENTIN 300 MG PO CAPS
300.0000 mg | ORAL_CAPSULE | ORAL | Status: AC
  Administered 2018-04-11: 300 mg via ORAL

## 2018-04-11 MED ORDER — ROCURONIUM BROMIDE 50 MG/5ML IV SOSY
PREFILLED_SYRINGE | INTRAVENOUS | Status: AC
  Filled 2018-04-11: qty 5

## 2018-04-11 MED ORDER — BUPIVACAINE-EPINEPHRINE (PF) 0.25% -1:200000 IJ SOLN
INTRAMUSCULAR | Status: AC
  Filled 2018-04-11: qty 30

## 2018-04-11 MED ORDER — LIDOCAINE 2% (20 MG/ML) 5 ML SYRINGE
INTRAMUSCULAR | Status: AC
  Filled 2018-04-11: qty 5

## 2018-04-11 MED ORDER — LIDOCAINE HCL (CARDIAC) PF 100 MG/5ML IV SOSY
PREFILLED_SYRINGE | INTRAVENOUS | Status: DC | PRN
  Administered 2018-04-11: 100 mg via INTRAVENOUS

## 2018-04-11 MED ORDER — ONDANSETRON HCL 4 MG/2ML IJ SOLN
INTRAMUSCULAR | Status: AC
  Filled 2018-04-11: qty 2

## 2018-04-11 MED ORDER — ROCURONIUM 10MG/ML (10ML) SYRINGE FOR MEDFUSION PUMP - OPTIME
INTRAVENOUS | Status: DC | PRN
  Administered 2018-04-11: 40 mg via INTRAVENOUS

## 2018-04-11 MED ORDER — BUPIVACAINE-EPINEPHRINE 0.25% -1:200000 IJ SOLN
INTRAMUSCULAR | Status: DC | PRN
  Administered 2018-04-11: 20 mL

## 2018-04-11 MED ORDER — SUGAMMADEX SODIUM 500 MG/5ML IV SOLN
INTRAVENOUS | Status: DC | PRN
  Administered 2018-04-11: 500 mg via INTRAVENOUS

## 2018-04-11 MED ORDER — ONDANSETRON HCL 4 MG/2ML IJ SOLN: 4.0000 mg | Freq: Once | INTRAMUSCULAR | Status: DC | PRN

## 2018-04-11 MED ORDER — ROCURONIUM BROMIDE 100 MG/10ML IV SOLN
INTRAVENOUS | Status: DC | PRN
  Administered 2018-04-11: 10 mg via INTRAVENOUS

## 2018-04-11 MED ORDER — DEXAMETHASONE SODIUM PHOSPHATE 10 MG/ML IJ SOLN
INTRAMUSCULAR | Status: AC
  Filled 2018-04-11: qty 1

## 2018-04-11 MED ORDER — GABAPENTIN 300 MG PO CAPS
ORAL_CAPSULE | ORAL | Status: AC
  Filled 2018-04-11: qty 1

## 2018-04-11 MED ORDER — BUPIVACAINE HCL (PF) 0.5 % IJ SOLN
INTRAMUSCULAR | Status: AC
  Filled 2018-04-11: qty 30

## 2018-04-11 MED ORDER — PHENYLEPHRINE HCL 10 MG/ML IJ SOLN
INTRAMUSCULAR | Status: DC | PRN
  Administered 2018-04-11: 80 ug via INTRAVENOUS

## 2018-04-11 MED ORDER — FENTANYL CITRATE (PF) 100 MCG/2ML IJ SOLN
INTRAMUSCULAR | Status: AC
  Filled 2018-04-11: qty 2

## 2018-04-11 MED ORDER — SUCCINYLCHOLINE CHLORIDE 20 MG/ML IJ SOLN
INTRAMUSCULAR | Status: DC | PRN
  Administered 2018-04-11: 120 mg via INTRAVENOUS

## 2018-04-11 MED ORDER — ACETAMINOPHEN 500 MG PO TABS
1000.0000 mg | ORAL_TABLET | ORAL | Status: AC
  Administered 2018-04-11: 1000 mg via ORAL

## 2018-04-11 MED ORDER — SUGAMMADEX SODIUM 500 MG/5ML IV SOLN
INTRAVENOUS | Status: AC
  Filled 2018-04-11: qty 5

## 2018-04-11 MED ORDER — FENTANYL CITRATE (PF) 100 MCG/2ML IJ SOLN
50.0000 ug | INTRAMUSCULAR | Status: DC | PRN
  Administered 2018-04-11: 100 ug via INTRAVENOUS

## 2018-04-11 MED ORDER — DEXAMETHASONE SODIUM PHOSPHATE 4 MG/ML IJ SOLN
INTRAMUSCULAR | Status: DC | PRN
  Administered 2018-04-11: 10 mg via INTRAVENOUS

## 2018-04-11 MED ORDER — PROPOFOL 10 MG/ML IV BOLUS
INTRAVENOUS | Status: AC
  Filled 2018-04-11: qty 40

## 2018-04-11 MED ORDER — FENTANYL CITRATE (PF) 100 MCG/2ML IJ SOLN: 25.0000 ug | INTRAMUSCULAR | Status: DC | PRN

## 2018-04-11 SURGICAL SUPPLY — 43 items
ADH SKN CLS APL DERMABOND .7 (GAUZE/BANDAGES/DRESSINGS) ×1
BLADE CLIPPER SURG (BLADE) ×1 IMPLANT
BLADE HEX COATED 2.75 (ELECTRODE) ×2 IMPLANT
BLADE SURG 15 STRL LF DISP TIS (BLADE) ×1 IMPLANT
BLADE SURG 15 STRL SS (BLADE) ×2
CANISTER SUCT 1200ML W/VALVE (MISCELLANEOUS) IMPLANT
CHLORAPREP W/TINT 26ML (MISCELLANEOUS) ×2 IMPLANT
COVER BACK TABLE 60X90IN (DRAPES) ×2 IMPLANT
COVER MAYO STAND STRL (DRAPES) ×2 IMPLANT
COVER WAND RF STERILE (DRAPES) IMPLANT
DECANTER SPIKE VIAL GLASS SM (MISCELLANEOUS) IMPLANT
DERMABOND ADVANCED (GAUZE/BANDAGES/DRESSINGS) ×1
DERMABOND ADVANCED .7 DNX12 (GAUZE/BANDAGES/DRESSINGS) ×1 IMPLANT
DRAPE LAPAROTOMY 100X72 PEDS (DRAPES) ×2 IMPLANT
DRAPE UTILITY XL STRL (DRAPES) ×2 IMPLANT
ELECT REM PT RETURN 9FT ADLT (ELECTROSURGICAL) ×2
ELECTRODE REM PT RTRN 9FT ADLT (ELECTROSURGICAL) ×1 IMPLANT
GLOVE EXAM NITRILE MD LF STRL (GLOVE) ×1 IMPLANT
GLOVE SURG SIGNA 7.5 PF LTX (GLOVE) IMPLANT
GLOVE SURG SS PI 7.0 STRL IVOR (GLOVE) ×1 IMPLANT
GLOVE SURG SS PI 7.5 STRL IVOR (GLOVE) ×1 IMPLANT
GOWN STRL REUS W/ TWL LRG LVL3 (GOWN DISPOSABLE) ×1 IMPLANT
GOWN STRL REUS W/ TWL XL LVL3 (GOWN DISPOSABLE) ×1 IMPLANT
GOWN STRL REUS W/TWL LRG LVL3 (GOWN DISPOSABLE) ×2
GOWN STRL REUS W/TWL XL LVL3 (GOWN DISPOSABLE) ×4
NDL HYPO 25X1 1.5 SAFETY (NEEDLE) ×1 IMPLANT
NEEDLE HYPO 25X1 1.5 SAFETY (NEEDLE) ×2 IMPLANT
NS IRRIG 1000ML POUR BTL (IV SOLUTION) IMPLANT
PACK BASIN DAY SURGERY FS (CUSTOM PROCEDURE TRAY) ×2 IMPLANT
PENCIL BUTTON HOLSTER BLD 10FT (ELECTRODE) ×2 IMPLANT
SLEEVE SCD COMPRESS KNEE MED (MISCELLANEOUS) ×1 IMPLANT
SPONGE LAP 4X18 RFD (DISPOSABLE) ×2 IMPLANT
SUT ETHILON 2 0 FS 18 (SUTURE) ×1 IMPLANT
SUT MNCRL AB 4-0 PS2 18 (SUTURE) IMPLANT
SUT VIC AB 2-0 SH 27 (SUTURE) ×2
SUT VIC AB 2-0 SH 27XBRD (SUTURE) IMPLANT
SUT VIC AB 3-0 SH 27 (SUTURE)
SUT VIC AB 3-0 SH 27X BRD (SUTURE) IMPLANT
SYR BULB 3OZ (MISCELLANEOUS) IMPLANT
SYR CONTROL 10ML LL (SYRINGE) ×2 IMPLANT
TOWEL GREEN STERILE FF (TOWEL DISPOSABLE) ×2 IMPLANT
TUBE CONNECTING 20X1/4 (TUBING) IMPLANT
YANKAUER SUCT BULB TIP NO VENT (SUCTIONS) IMPLANT

## 2018-04-11 NOTE — Anesthesia Procedure Notes (Signed)
Procedure Name: Intubation Performed by: Verita Lamb, CRNA Pre-anesthesia Checklist: Patient identified, Emergency Drugs available, Suction available, Patient being monitored and Timeout performed Patient Re-evaluated:Patient Re-evaluated prior to induction Oxygen Delivery Method: Circle system utilized Preoxygenation: Pre-oxygenation with 100% oxygen Induction Type: IV induction and Rapid sequence Ventilation: Mask ventilation with difficulty Laryngoscope Size: Glidescope and 3 Grade View: Grade I Tube type: Oral Number of attempts: 1 Airway Equipment and Method: Stylet Placement Confirmation: ETT inserted through vocal cords under direct vision,  positive ETCO2,  CO2 detector and breath sounds checked- equal and bilateral Secured at: 24 cm Tube secured with: Tape Dental Injury: Teeth and Oropharynx as per pre-operative assessment  Comments: preoxygenate with 100 percent oxygen via facemask, smooth iv induction, somewhat difficult mask without oral airway.  Rapid sequence intubation with glidescope, grade I view, ett 7.0 through glottic opening.  Bbs+, +etco2.

## 2018-04-11 NOTE — Discharge Instructions (Signed)
Ok to shower starting tomorrow  Ice pack, tylenol, ibuprofen also for pain  No vigorous activity for one week  Ok to resume blood thinning meds       Post Anesthesia Home Care Instructions  Activity: Get plenty of rest for the remainder of the day. A responsible individual must stay with you for 24 hours following the procedure.  For the next 24 hours, DO NOT: -Drive a car -Paediatric nurse -Drink alcoholic beverages -Take any medication unless instructed by your physician -Make any legal decisions or sign important papers.  Meals: Start with liquid foods such as gelatin or soup. Progress to regular foods as tolerated. Avoid greasy, spicy, heavy foods. If nausea and/or vomiting occur, drink only clear liquids until the nausea and/or vomiting subsides. Call your physician if vomiting continues.  Special Instructions/Symptoms: Your throat may feel dry or sore from the anesthesia or the breathing tube placed in your throat during surgery. If this causes discomfort, gargle with warm salt water. The discomfort should disappear within 24 hours.  If you had a scopolamine patch placed behind your ear for the management of post- operative nausea and/or vomiting:  1. The medication in the patch is effective for 72 hours, after which it should be removed.  Wrap patch in a tissue and discard in the trash. Wash hands thoroughly with soap and water. 2. You may remove the patch earlier than 72 hours if you experience unpleasant side effects which may include dry mouth, dizziness or visual disturbances. 3. Avoid touching the patch. Wash your hands with soap and water after contact with the patch.

## 2018-04-11 NOTE — Interval H&P Note (Signed)
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 After consideration of risks, benefits and other options for treatment, the patient has consented to  Procedure(s): EXCISION POSTERIOR NECK CYST (N/A) as a surgical intervention .  The patient's history has been reviewed, patient examined, no change in status, stable for surgery.  I have reviewed the patient's chart and labs.  Questions were answered to the patient's satisfaction.     Coralie Keens

## 2018-04-11 NOTE — Transfer of Care (Signed)
Immediate Anesthesia Transfer of Care Note  Patient: [name]  Procedure(s) Performed: EXCISION POSTERIOR NECK CYST (N/A Neck)  Patient Location: PACU  Anesthesia Type:General  Level of Consciousness: awake, alert  and oriented  Airway & Oxygen Therapy: Patient Spontanous Breathing and Patient connected to face mask oxygen  Post-op Assessment: Report given to RN and Post -op Vital signs reviewed and stable  Post vital signs: Reviewed and stable  Last Vitals:  Vitals Value Taken Time  BP 130/70 04/11/2018  8:02 AM  Temp    Pulse 88 04/11/2018  8:14 AM  Resp 15 04/11/2018  8:14 AM  SpO2 99 % 04/11/2018  8:14 AM  Vitals shown include unvalidated device data.  Last Pain:  Vitals:   04/11/18 0637  TempSrc: Oral  PainSc: 0-No pain         Complications: No apparent anesthesia complications

## 2018-04-11 NOTE — Op Note (Signed)
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  He was placed upon the operating room table and general anesthesia was induced.  He was then placed in the right lateral decubitus position.  The back of his neck was shaved.  It was then prepped and draped in the usual sterile fashion.  He has had a previous infected sebaceous cyst in this area.  There was chronic scarring and multiple tiny appearing skin lesions.  I anesthetized the skin with Marcaine and then made a a transverse incision incorporating the previous scars with a scalpel.  I then completely excised approximately 6 cm of elliptical skin and subcutaneous tissue which was then sent to pathology for evaluation.  Hemostasis was achieved with the cautery.  The underlying tissue appeared normal.  I anesthetized the incision further with Marcaine.  I then closed the subcutaneous tissue with interrupted 2-0 Vicryl sutures and closed the skin with interrupted 2-0 nylon sutures.  Gauze and tape were then applied.  The patient tolerated the procedure well.  All the counts were correct at the end of the procedure.  The patient was then extubated in the operating room and taken in a stable condition to the recovery room.          Coralie Keens   Date: 04/11/2018  Time: 8:05 AM

## 2018-04-11 NOTE — Anesthesia Postprocedure Evaluation (Signed)
Anesthesia Post Note  Patient: [name]  Procedure(s) Performed: EXCISION POSTERIOR NECK CYST (N/A Neck)     Patient location during evaluation: PACU Anesthesia Type: General Level of consciousness: awake and alert and oriented Pain management: pain level controlled Vital Signs Assessment: post-procedure vital signs reviewed and stable Respiratory status: spontaneous breathing, nonlabored ventilation and respiratory function stable Cardiovascular status: blood pressure returned to baseline and stable Postop Assessment: no apparent nausea or vomiting Anesthetic complications: no    Last Vitals:  Vitals:   04/11/18 0845 04/11/18 0848  BP: (!) 154/74   Pulse: 87 89  Resp: 18 20  Temp:    SpO2: 97% 96%    Last Pain:  Vitals:   04/11/18 0845  TempSrc:   PainSc: 0-No pain                 Gwendalynn Eckstrom A.

## 2018-04-12 ENCOUNTER — Encounter: Payer: Self-pay | Admitting: Cardiovascular Disease

## 2018-04-12 ENCOUNTER — Ambulatory Visit: Payer: Federal, State, Local not specified - PPO | Admitting: Cardiovascular Disease

## 2018-04-12 VITALS — BP 136/72 | HR 67 | Ht 69.5 in | Wt 261.4 lb

## 2018-04-12 DIAGNOSIS — E782 Mixed hyperlipidemia: Secondary | ICD-10-CM | POA: Diagnosis not present

## 2018-04-12 DIAGNOSIS — R079 Chest pain, unspecified: Secondary | ICD-10-CM

## 2018-04-12 DIAGNOSIS — R0989 Other specified symptoms and signs involving the circulatory and respiratory systems: Secondary | ICD-10-CM

## 2018-04-12 DIAGNOSIS — G4733 Obstructive sleep apnea (adult) (pediatric): Secondary | ICD-10-CM | POA: Diagnosis not present

## 2018-04-12 DIAGNOSIS — I480 Paroxysmal atrial fibrillation: Secondary | ICD-10-CM

## 2018-04-12 HISTORY — DX: Other specified symptoms and signs involving the circulatory and respiratory systems: R09.89

## 2018-04-12 NOTE — Assessment & Plan Note (Signed)
Left carotid bruit on exam today.  We will check a carotid Doppler study to further evaluate.

## 2018-04-12 NOTE — Assessment & Plan Note (Signed)
History of hyperlipidemia not on statin therapy with lipid profile performed 01/25/2017 revealing total cholesterol 197, LDL 131 and HDL 40.  We will obtain his most recent lab work from his PCPs office

## 2018-04-12 NOTE — Assessment & Plan Note (Signed)
History of obstructive sleep apnea on CPAP. 

## 2018-04-12 NOTE — Progress Notes (Signed)
04/12/2018 [place]   1950/08/12  [id number]  Primary Physician Ian Pretty, MD Primary Cardiologist: Ian Harp MD [name], [place]  HPI:  [name] is a 68 y.o.  married Caucasian male father of 3 daughters referred by [name] for cardiovascular evaluation because of new onset A. Fib.  I last saw him in the office 12/29/2017. I actually took care of [name] 4 to 5 years ago and prior to that back in 2007.  He has a history of treated hypertension and untreated mild hyperlipidemia as well as family history of heart disease with maternal grandfather who had CABG in his 40s and a father who had a myocardial infarction in his 35s.  He had negative stress test in the past both in 2007 and again in 2015.  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  [name] follows his thyroid functions.  He is on thyroid replacement therapy.  He is recently been demonstrated to be in atrial fibrillation which he thinks is been in for several months and was begun on Eliquis oral anticoagulation.  He also has obstructive sleep apnea on CPAP.  He does drink ice tea several times a day decaffeinated coffee.  He does not drink alcohol.  He underwent successful outpatient DC cardioversion by [name] 01/17/2018 with marked 1 shock restoring sinus rhythm.  He felt clinically improved after that.  He has remained on Eliquis oral anticoagulation.  He suspects he went into brief PAF yesterday after he had outpatient surgery and is back on his Eliquis.  He did have a negative GXT on 02/04/2018 as well as a 2D echo that showed normal normal LV systolic function, mild LV cavity dilatation mild LVH with moderate left atrial enlargement.   Current Meds  Medication Sig  . amLODipine (NORVASC) 5 MG tablet Take 5 mg by mouth daily.   Marland Kitchen apixaban (ELIQUIS) 5 MG TABS tablet Take 5 mg by mouth 2 (two)  times daily.  . cholecalciferol (VITAMIN D3) 25 MCG (1000 UT) tablet Take 1,000 Units by mouth daily.  . Coenzyme Q10 (COQ10) 100 MG CAPS Take 1 capsule by mouth daily.  Marland Kitchen levothyroxine (SYNTHROID, LEVOTHROID) 175 MCG tablet Take 175 mcg by mouth daily before breakfast.   . Multiple Vitamins-Minerals (PRESERVISION AREDS 2+MULTI VIT PO) Take 1 capsule by mouth 2 (two) times daily.  Marland Kitchen olmesartan-hydrochlorothiazide (BENICAR HCT) 40-25 MG tablet Take 1 tablet by mouth daily.  Marland Kitchen OVER THE COUNTER MEDICATION Take 1 capsule by mouth daily. Zyflamend Whole Body (10-herb blend for pain relief/inflammation--Rosemary,Turmeric, Green Tea, Ginseng, Chinese Skullcap, [place], [place], [place], holly basil, organic oregano)  . sertraline (ZOLOFT) 100 MG tablet Take 50 mg by mouth daily.   Marland Kitchen testosterone cypionate (DEPOTESTOSTERONE CYPIONATE) 200 MG/ML injection Inject 200 mg into the muscle every 14 (fourteen) days.   . traMADol (ULTRAM) 50 MG tablet Take 1 tablet (50 mg total) by mouth every 6 (six) hours as needed.  . vitamin C (ASCORBIC ACID) 500 MG tablet Take 500 mg by mouth daily.     Allergies  Allergen Reactions  . Penicillins Anaphylaxis    Pt reports throat swelling, difficulty breathing at age 37 Has patient had a PCN reaction causing immediate rash, facial/tongue/throat swelling, SOB or lightheadedness with hypotension: yes Has patient had a PCN reaction causing severe rash involving mucus membranes or  skin necrosis: no Has patient had a PCN reaction that required hospitalization no Has patient had a PCN reaction occurring within the last 10 years: no If all of the above answers are "NO", then may proceed with Cephalosporin use.   . Latex Rash    Social History   Socioeconomic History  . Marital status: Married    Spouse name: Not on file  . Number of children: Not on file  . Years of education: Not on file  . Highest education level: Not on file  Occupational History  .  Not on file  Social Needs  . Financial resource strain: Not on file  . Food insecurity:    Worry: Not on file    Inability: Not on file  . Transportation needs:    Medical: Not on file    Non-medical: Not on file  Tobacco Use  . Smoking status: Former Smoker    Types: Pipe  . Smokeless tobacco: Never Used  . Tobacco comment: quit 40 years ago  Substance and Sexual Activity  . Alcohol use: Yes    Comment: rarely , glass of wine once a year  . Drug use: No  . Sexual activity: Yes  Lifestyle  . Physical activity:    Days per week: Not on file    Minutes per session: Not on file  . Stress: Not on file  Relationships  . Social connections:    Talks on phone: Not on file    Gets together: Not on file    Attends religious service: Not on file    Active member of club or organization: Not on file    Attends meetings of clubs or organizations: Not on file    Relationship status: Not on file  . Intimate partner violence:    Fear of current or ex partner: Not on file    Emotionally abused: Not on file    Physically abused: Not on file    Forced sexual activity: Not on file  Other Topics Concern  . Not on file  Social History Narrative  . Not on file     Review of Systems: General: negative for chills, fever, night sweats or weight changes.  Cardiovascular: negative for chest pain, dyspnea on exertion, edema, orthopnea, palpitations, paroxysmal nocturnal dyspnea or shortness of breath Dermatological: negative for rash Respiratory: negative for cough or wheezing Urologic: negative for hematuria Abdominal: negative for nausea, vomiting, diarrhea, bright red blood per rectum, melena, or hematemesis Neurologic: negative for visual changes, syncope, or dizziness All other systems reviewed and are otherwise negative except as noted above.    Blood pressure 136/72, pulse 67, height 5' 9.5" (1.765 m), weight 261 lb 6.4 oz (118.6 kg).  General appearance: alert Neck: no  adenopathy, no JVD, supple, symmetrical, trachea midline, thyroid not enlarged, symmetric, no tenderness/mass/nodules and Left carotid bruit Lungs: clear to auscultation bilaterally Heart: regular rate and rhythm, S1, S2 normal, no murmur, click, rub or gallop Extremities: extremities normal, atraumatic, no cyanosis or edema Pulses: 2+ and symmetric Skin: Skin color, texture, turgor normal. No rashes or lesions Neurologic: Alert and oriented X 3, normal strength and tone. Normal symmetric reflexes. Normal coordination and gait  EKG normal sinus rhythm at 68 without ST or T wave changes.  I personally reviewed this EKG.  ASSESSMENT AND PLAN:   Chest pain with moderate risk for cardiac etiology Negative GXT 02/04/2018  HTN (hypertension) History of essential hypertension with blood pressure measured today at 136/72.  He is  on amlodipine and Benicar/hydrochlorothiazide.  HLD (hyperlipidemia) History of hyperlipidemia not on statin therapy with lipid profile performed 01/25/2017 revealing total cholesterol 197, LDL 131 and HDL 40.  We will obtain his most recent lab work from his PCPs office  Obstructive sleep apnea History of obstructive sleep apnea on CPAP  Paroxysmal atrial fibrillation (HCC) History of paroxysmal atrial fibrillation status post outpatient DC cardioversion by [name] 01/17/2018 successfully with 1 shock.  He immediately noticed improvement in his clinical symptoms in sinus rhythm which he has maintained today on Eliquis oral anticoagulation.  Left carotid bruit Left carotid bruit on exam today.  We will check a carotid Doppler study to further evaluate.      Ian Harp MD FACP,FACC,FAHA, [facility] 04/12/2018 8:30 AM

## 2018-04-12 NOTE — Assessment & Plan Note (Signed)
History of essential hypertension with blood pressure measured today at 136/72.  He is on amlodipine and Benicar/hydrochlorothiazide.

## 2018-04-12 NOTE — Assessment & Plan Note (Signed)
Negative GXT 02/04/2018

## 2018-04-12 NOTE — Patient Instructions (Addendum)
Medication Instructions:  Your physician recommends that you continue on your current medications as directed. Please refer to the Current Medication list given to you today.  If you need a refill on your cardiac medications before your next appointment, please call your pharmacy.   Lab work: NONE If you have labs (blood work) drawn today and your tests are completely normal, you will receive your results only by: Marland Kitchen MyChart Message (if you have MyChart) OR . A paper copy in the mail If you have any lab test that is abnormal or we need to change your treatment, we will call you to review the results.  Testing/Procedures: Your physician has requested that you have a carotid duplex. This test is an ultrasound of the carotid arteries in your neck. It looks at blood flow through these arteries that supply the brain with blood. Allow one hour for this exam. There are no restrictions or special instructions.   Follow-Up: At [facility], you and your health needs are our priority.  As part of our continuing mission to provide you with exceptional heart care, we have created designated Provider Care Teams.  These Care Teams include your primary Cardiologist (physician) and Advanced Practice Providers (APPs -  Physician Assistants and Nurse Practitioners) who all work together to provide you with the care you need, when you need it. . You will need a follow up appointment in 6 months with an APP and 12 months.  Please call our office 2 months in advance to schedule this appointment.  You may see Dr. Gwenlyn Found or one of the following Advanced Practice Providers on your designated Care Team:   . Kerin Ransom, [place] . Almyra Deforest, PA-C . Fabian Sharp, PA-C . Jory Sims, DNP . Rosaria Ferries, PA-C . Roby Lofts, PA-C . Sande Rives, PA-C  Any Other Special Instructions Will Be Listed Below (If Applicable).  Heart-Healthy Eating Plan Heart-healthy meal planning includes:  Eating less unhealthy  fats.  Eating more healthy fats.  Making other changes in your diet. Talk with your doctor or a diet specialist (dietitian) to create an eating plan that is right for you. What is my plan? Your doctor may recommend an eating plan that includes:  Total fat: ______% or less of total calories a day.  Saturated fat: ______% or less of total calories a day.  Cholesterol: less than _________mg a day. What are tips for following this plan? Cooking Avoid frying your food. Try to bake, boil, grill, or broil it instead. You can also reduce fat by:  Removing the skin from poultry.  Removing all visible fats from meats.  Steaming vegetables in water or broth. Meal planning   At meals, divide your plate into four equal parts: ? Fill one-half of your plate with vegetables and green salads. ? Fill one-fourth of your plate with whole grains. ? Fill one-fourth of your plate with lean protein foods.  Eat 4-5 servings of vegetables per day. A serving of vegetables is: ? 1 cup of raw or cooked vegetables. ? 2 cups of raw leafy greens.  Eat 4-5 servings of fruit per day. A serving of fruit is: ? 1 medium whole fruit. ?  cup of dried fruit. ?  cup of fresh, frozen, or canned fruit. ?  cup of 100% fruit juice.  Eat more foods that have soluble fiber. These are apples, broccoli, carrots, beans, peas, and barley. Try to get 20-30 g of fiber per day.  Eat 4-5 servings of nuts, legumes,  and seeds per week: ? 1 serving of dried beans or legumes equals  cup after being cooked. ? 1 serving of nuts is  cup. ? 1 serving of seeds equals 1 tablespoon. General information  Eat more home-cooked food. Eat less restaurant, buffet, and fast food.  Limit or avoid alcohol.  Limit foods that are high in starch and sugar.  Avoid fried foods.  Lose weight if you are overweight.  Keep track of how much salt (sodium) you eat. This is important if you have high blood pressure. Ask your doctor to  tell you more about this.  Try to add vegetarian meals each week. Fats  Choose healthy fats. These include olive oil and canola oil, flaxseeds, walnuts, almonds, and seeds.  Eat more omega-3 fats. These include salmon, mackerel, sardines, tuna, flaxseed oil, and ground flaxseeds. Try to eat fish at least 2 times each week.  Check food labels. Avoid foods with trans fats or high amounts of saturated fat.  Limit saturated fats. ? These are often found in animal products, such as meats, butter, and cream. ? These are also found in plant foods, such as palm oil, palm kernel oil, and coconut oil.  Avoid foods with partially hydrogenated oils in them. These have trans fats. Examples are stick margarine, some tub margarines, cookies, crackers, and other baked goods. What foods can I eat? Fruits All fresh, canned (in natural juice), or frozen fruits. Vegetables Fresh or frozen vegetables (raw, steamed, roasted, or grilled). Green salads. Grains Most grains. Choose whole wheat and whole grains most of the time. Rice and pasta, including brown rice and pastas made with whole wheat. Meats and other proteins Lean, well-trimmed beef, veal, pork, and lamb. Chicken and [place] without skin. All fish and shellfish. Wild duck, rabbit, pheasant, and venison. Egg whites or low-cholesterol egg substitutes. Dried beans, peas, lentils, and tofu. Seeds and most nuts. Dairy Low-fat or nonfat cheeses, including ricotta and mozzarella. Skim or 1% milk that is liquid, powdered, or evaporated. Buttermilk that is made with low-fat milk. Nonfat or low-fat yogurt. Fats and oils Non-hydrogenated (trans-free) margarines. Vegetable oils, including soybean, sesame, sunflower, olive, peanut, safflower, corn, canola, and cottonseed. Salad dressings or mayonnaise made with a vegetable oil. Beverages Mineral water. Coffee and tea. Diet carbonated beverages. Sweets and desserts Sherbet, gelatin, and fruit ice. Small amounts  of dark chocolate. Limit all sweets and desserts. Seasonings and condiments All seasonings and condiments. The items listed above may not be a complete list of foods and drinks you can eat. Contact a dietitian for more options. What foods should I avoid? Fruits Canned fruit in heavy syrup. Fruit in cream or butter sauce. Fried fruit. Limit coconut. Vegetables Vegetables cooked in cheese, cream, or butter sauce. Fried vegetables. Grains Breads that are made with saturated or trans fats, oils, or whole milk. Croissants. Sweet rolls. Donuts. High-fat crackers, such as cheese crackers. Meats and other proteins Fatty meats, such as hot dogs, ribs, sausage, bacon, rib-eye roast or steak. High-fat deli meats, such as salami and bologna. Caviar. Domestic duck and goose. Organ meats, such as liver. Dairy Cream, sour cream, cream cheese, and creamed cottage cheese. Whole-milk cheeses. Whole or 2% milk that is liquid, evaporated, or condensed. Whole buttermilk. Cream sauce or high-fat cheese sauce. Yogurt that is made from whole milk. Fats and oils Meat fat, or shortening. Cocoa butter, hydrogenated oils, palm oil, coconut oil, palm kernel oil. Solid fats and shortenings, including bacon fat, salt pork, lard, and butter. Nondairy  cream substitutes. Salad dressings with cheese or sour cream. Beverages Regular sodas and juice drinks with added sugar. Sweets and desserts Frosting. Pudding. Cookies. Cakes. Pies. Milk chocolate or white chocolate. Buttered syrups. Full-fat ice cream or ice cream drinks. The items listed above may not be a complete list of foods and drinks to avoid. Contact a dietitian for more information. Summary  Heart-healthy meal planning includes eating less unhealthy fats, eating more healthy fats, and making other changes in your diet.  Eat a balanced diet. This includes fruits and vegetables, low-fat or nonfat dairy, lean protein, nuts and legumes, whole grains, and heart-healthy  oils and fats. This information is not intended to replace advice given to you by your health care provider. Make sure you discuss any questions you have with your health care provider. Document Released: 07/28/2011 Document Revised: 03/05/2017 Document Reviewed: 03/05/2017 Elsevier Interactive Patient Education  2019 [employer].

## 2018-04-12 NOTE — Assessment & Plan Note (Signed)
History of paroxysmal atrial fibrillation status post outpatient DC cardioversion by Dr. Debara Pickett 01/17/2018 successfully with 1 shock.  He immediately noticed improvement in his clinical symptoms in sinus rhythm which he has maintained today on Eliquis oral anticoagulation.

## 2018-04-22 ENCOUNTER — Other Ambulatory Visit: Payer: Self-pay

## 2018-04-22 ENCOUNTER — Ambulatory Visit (HOSPITAL_COMMUNITY)
Admission: RE | Admit: 2018-04-22 | Discharge: 2018-04-22 | Disposition: A | Discharge status: Home / Self Care | Payer: Federal, State, Local not specified - PPO | Source: Ambulatory Visit | Attending: Cardiology | Admitting: Cardiology

## 2018-04-22 DIAGNOSIS — R0989 Other specified symptoms and signs involving the circulatory and respiratory systems: Secondary | ICD-10-CM | POA: Insufficient documentation

## 2018-11-04 ENCOUNTER — Other Ambulatory Visit: Payer: Self-pay

## 2018-11-04 ENCOUNTER — Ambulatory Visit: Payer: Federal, State, Local not specified - PPO | Admitting: Cardiovascular Disease

## 2018-11-04 ENCOUNTER — Encounter: Payer: Self-pay | Admitting: Cardiovascular Disease

## 2018-11-04 VITALS — BP 106/76 | HR 112 | Ht 69.0 in | Wt 259.0 lb

## 2018-11-04 DIAGNOSIS — E782 Mixed hyperlipidemia: Secondary | ICD-10-CM | POA: Diagnosis not present

## 2018-11-04 DIAGNOSIS — I10 Essential (primary) hypertension: Secondary | ICD-10-CM | POA: Diagnosis not present

## 2018-11-04 DIAGNOSIS — I4891 Unspecified atrial fibrillation: Secondary | ICD-10-CM

## 2018-11-04 MED ORDER — DILTIAZEM HCL ER COATED BEADS 120 MG PO CP24: 120.0000 mg | ORAL_CAPSULE | Freq: Every day | ORAL | 3 refills | Status: DC

## 2018-11-04 NOTE — Patient Instructions (Addendum)
Medication Instructions:  Your physician has recommended you make the following change in your medication:   STOP TAKING YOUR AMLODIPINE (NORVASC).  START DILTIAZEM (CARDIZEM CD), 120 MG BY MOUTH DAILY.  If you need a refill on your cardiac medications before your next appointment, please call your pharmacy.   Lab work: Your physician recommends that you HAVE lab work TODAY: THYROID FUNCTION PANEL  If you have labs (blood work) drawn today and your tests are completely normal, you will receive your results only by: Marland Kitchen MyChart Message (if you have MyChart) OR . A paper copy in the mail If you have any lab test that is abnormal or we need to change your treatment, we will call you to review the results.  Testing/Procedures: NONE  Follow-Up: At [facility], you and your health needs are our priority.  As part of our continuing mission to provide you with exceptional heart care, we have created designated Provider Care Teams.  These Care Teams include your primary Cardiologist (physician) and Advanced Practice Providers (APPs -  Physician Assistants and Nurse Practitioners) who all work together to provide you with the care you need, when you need it. . You will need a follow up appointment in 6 months with Dr. Quay Burow.  Please call our office 2 months in advance to schedule this appointment.     Any Other Special Instructions Will Be Listed Below (If Applicable). REFERRAL TO DR. Thompson Grayer IN ELECTROPHYSIOLOGY FOR CONSIDERATION OF ANTIARRYTHMIC THERAPY

## 2018-11-04 NOTE — Assessment & Plan Note (Signed)
History of obstructive sleep apnea on CPAP. 

## 2018-11-04 NOTE — Assessment & Plan Note (Signed)
History of paroxysmal A. fib now persistent with EKG revealing A. fib with a ventricular spots of 112.  He thinks he probably went into this 2 or 3 months ago.  He did have DC cardioversion by Dr. Debara Pickett 01/17/2018.  I am referring him to Dr. Rayann Heman for consideration of antiarrhythmic therapy and repeat cardioversion versus ablation.

## 2018-11-04 NOTE — Progress Notes (Signed)
11/04/2018 [place]   [birth year]  [id number]  Primary Physician Deland Pretty, MD Primary Cardiologist: Lorretta Harp MD [name], [place]  HPI:  [name] is a 68 y.o.  married Caucasian male father of 3 daughters referred by Dr.Pharrfor cardiovascular evaluation because of new onset A. Fib.  I last saw him in the office 04/12/2018.I actually took care of Mr. Suski 4 to 5 years ago and prior to that back in 2007. He has a history of treated hypertension and untreated mild hyperlipidemia as well as family history of heart disease with maternal grandfather who had CABG in his 76s and a father who had a myocardial infarction in his 24s. He had negative stress test in the past both in 2007 and again in 2015. 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 Dr. Marcelle Smiling his thyroid functions. He is on thyroid replacement therapy. He is recently been demonstrated to be in atrial fibrillation which he thinks is been in for several months and was begun on Eliquis oral anticoagulation. He also has obstructive sleep apnea on CPAP. He does drink ice tea several times a day decaffeinated coffee. He does not drink alcohol.  He underwent successful outpatient DC cardioversion by Dr. Debara Pickett 01/17/2018 with marked 1 shock restoring sinus rhythm.  He felt clinically improved after that.  He has remained on Eliquis oral anticoagulation.  He suspects he went into brief PAF yesterday after he had outpatient surgery and is back on his Eliquis.  He did have a negative GXT on 02/04/2018 as well as a 2D echo that showed normal normal LV systolic function, mild LV cavity dilatation mild LVH with moderate left atrial enlargement.  Since I saw him 6 months ago he apparently clinically went back in A. fib in June and he is feeling somewhat lethargic.  His heart rate is 112 today.  He is on  Eliquis oral anticoagulation.  He does wear his CPAP and his decrease his caffeine intake.  I am going to refer him to Dr. Rayann Heman for consideration of antiarrhythmic therapy with repeat cardioversion versus A. fib ablation.  He denies chest pain or shortness of breath.    Current Meds  Medication Sig   amLODipine (NORVASC) 5 MG tablet Take 5 mg by mouth daily.    apixaban (ELIQUIS) 5 MG TABS tablet Take 5 mg by mouth 2 (two) times daily.   cholecalciferol (VITAMIN D3) 25 MCG (1000 UT) tablet Take 1,000 Units by mouth daily.   Coenzyme Q10 (COQ10) 100 MG CAPS Take 1 capsule by mouth daily.   levothyroxine (SYNTHROID, LEVOTHROID) 175 MCG tablet Take 175 mcg by mouth daily before breakfast.    Multiple Vitamins-Minerals (PRESERVISION AREDS 2+MULTI VIT PO) Take 1 capsule by mouth 2 (two) times daily.   olmesartan-hydrochlorothiazide (BENICAR HCT) 40-25 MG tablet Take 1 tablet by mouth daily.   OVER THE COUNTER MEDICATION Take 1 capsule by mouth daily. Zyflamend Whole Body (10-herb blend for pain relief/inflammation--Rosemary,Turmeric, Green Tea, Ginseng, Chinese Skullcap, [place], [place], [place], holly basil, organic oregano)   sertraline (ZOLOFT) 100 MG tablet Take 50 mg by mouth daily.    testosterone cypionate (DEPOTESTOSTERONE CYPIONATE) 200 MG/ML injection Inject 200 mg into the muscle every 14 (fourteen) days.    vitamin C (ASCORBIC ACID) 500 MG tablet Take 500 mg by mouth daily.     Allergies  Allergen Reactions   Penicillins Anaphylaxis    Pt reports throat swelling, difficulty breathing at age 52 Has patient had a PCN reaction causing immediate rash, facial/tongue/throat swelling, SOB or lightheadedness with hypotension: yes Has patient had a PCN reaction causing severe rash involving mucus membranes or skin necrosis: no Has patient had a PCN reaction that required hospitalization no Has patient had a PCN reaction occurring within the last 10 years: no If  all of the above answers are "NO", then may proceed with Cephalosporin use.    Latex Rash    Social History   Socioeconomic History   Marital status: Married    Spouse name: Not on file   Number of children: Not on file   Years of education: Not on file   Highest education level: Not on file  Occupational History   Not on file  Social Needs   Financial resource strain: Not on file   Food insecurity    Worry: Not on file    Inability: Not on file   Transportation needs    Medical: Not on file    Non-medical: Not on file  Tobacco Use   Smoking status: Former Smoker    Types: Pipe   Smokeless tobacco: Never Used   Tobacco comment: quit 40 years ago  Substance and Sexual Activity   Alcohol use: Yes    Comment: rarely , glass of wine once a year   Drug use: No   Sexual activity: Yes  Lifestyle   Physical activity    Days per week: Not on file    Minutes per session: Not on file   Stress: Not on file  Relationships   Social connections    Talks on phone: Not on file    Gets together: Not on file    Attends religious service: Not on file    Active member of club or organization: Not on file    Attends meetings of clubs or organizations: Not on file    Relationship status: Not on file   Intimate partner violence    Fear of current or ex partner: Not on file    Emotionally abused: Not on file    Physically abused: Not on file    Forced sexual activity: Not on file  Other Topics Concern   Not on file  Social History Narrative   Not on file     Review of Systems: General: negative for chills, fever, night sweats or weight changes.  Cardiovascular: negative for chest pain, dyspnea on exertion, edema, orthopnea, palpitations, paroxysmal nocturnal dyspnea or shortness of breath Dermatological: negative for rash Respiratory: negative for cough or wheezing Urologic: negative for hematuria Abdominal: negative for nausea, vomiting, diarrhea, bright  red blood per rectum, melena, or hematemesis Neurologic: negative for visual changes, syncope, or dizziness All other systems reviewed and are otherwise negative except as noted above.    Blood pressure 106/76, pulse (!) 112, height 5\' 9"  (1.753 m), weight 259 lb (117.5 kg).  General appearance: alert and no distress Neck: no adenopathy, no carotid bruit, no JVD, supple, symmetrical, trachea midline and thyroid not enlarged, symmetric, no tenderness/mass/nodules Lungs: clear to auscultation bilaterally Heart: irregularly irregular rhythm Extremities: extremities normal, atraumatic, no cyanosis or edema Pulses: 2+ and symmetric Skin: Skin color, texture, turgor normal. No rashes or lesions Neurologic: Alert and oriented X 3, normal strength and tone. Normal symmetric reflexes. Normal coordination and gait  EKG atrial fibrillation with a ventricular spots of 112.  I personally  reviewed this EKG.  ASSESSMENT AND PLAN:   HTN (hypertension) History of essential hypertension blood pressure measured today at 106/76.  He is on amlodipine, Benicar and hydrochlorothiazide.  I am going to change his amlodipine to Cardizem CD 120 for rate control  HLD (hyperlipidemia) History of hyperlipidemia not on statin therapy with lipid profile performed 02/07/2018 revealing total cholesterol 210, LDL 143 and HDL 46.  Obesity History of moderate obesity with a BMI of 38  Obstructive sleep apnea History of obstructive sleep apnea on CPAP  Paroxysmal atrial fibrillation (HCC) History of paroxysmal A. fib now persistent with EKG revealing A. fib with a ventricular spots of 112.  He thinks he probably went into this 2 or 3 months ago.  He did have DC cardioversion by Dr. Debara Pickett 01/17/2018.  I am referring him to Dr. Rayann Heman for consideration of antiarrhythmic therapy and repeat cardioversion versus ablation.      Lorretta Harp MD FACP,FACC,FAHA, [facility] 11/04/2018 12:05 PM

## 2018-11-04 NOTE — Assessment & Plan Note (Signed)
History of moderate obesity with a BMI of 38

## 2018-11-04 NOTE — Assessment & Plan Note (Signed)
History of hyperlipidemia not on statin therapy with lipid profile performed 02/07/2018 revealing total cholesterol 210, LDL 143 and HDL 46.

## 2018-11-04 NOTE — Assessment & Plan Note (Signed)
History of essential hypertension blood pressure measured today at 106/76.  He is on amlodipine, Benicar and hydrochlorothiazide.  I am going to change his amlodipine to Cardizem CD 120 for rate control

## 2018-11-09 LAB — TSH+T3+FREE T4+T3 FREE
Free T-3: 2.9 pg/mL
Free T4 by Dialysis: 1.1 ng/dL
TSH: 2.3 uU/mL
Triiodothyronine (T-3), Serum: 101 ng/dL

## 2018-11-17 ENCOUNTER — Telehealth: Payer: Self-pay

## 2018-11-17 NOTE — Telephone Encounter (Signed)
Left a message regarding appt on 11/18/18. 

## 2018-11-18 ENCOUNTER — Other Ambulatory Visit (HOSPITAL_COMMUNITY)
Admission: RE | Admit: 2018-11-18 | Discharge: 2018-11-18 | Disposition: A | Discharge status: Home / Self Care | Payer: Federal, State, Local not specified - PPO | Source: Ambulatory Visit | Attending: Cardiovascular Disease | Admitting: Cardiovascular Disease

## 2018-11-18 ENCOUNTER — Encounter: Payer: Self-pay | Admitting: Internal Medicine

## 2018-11-18 ENCOUNTER — Telehealth (INDEPENDENT_AMBULATORY_CARE_PROVIDER_SITE_OTHER): Payer: Federal, State, Local not specified - PPO | Admitting: Internal Medicine

## 2018-11-18 ENCOUNTER — Other Ambulatory Visit: Payer: Self-pay

## 2018-11-18 ENCOUNTER — Telehealth: Payer: Self-pay

## 2018-11-18 VITALS — Ht 69.0 in | Wt 260.0 lb

## 2018-11-18 DIAGNOSIS — Z20828 Contact with and (suspected) exposure to other viral communicable diseases: Secondary | ICD-10-CM | POA: Insufficient documentation

## 2018-11-18 DIAGNOSIS — I4819 Other persistent atrial fibrillation: Secondary | ICD-10-CM

## 2018-11-18 DIAGNOSIS — Z01812 Encounter for preprocedural laboratory examination: Secondary | ICD-10-CM | POA: Insufficient documentation

## 2018-11-18 DIAGNOSIS — G4733 Obstructive sleep apnea (adult) (pediatric): Secondary | ICD-10-CM | POA: Diagnosis not present
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
## 2018-11-18 NOTE — Telephone Encounter (Signed)
-----   Message from Thompson Grayer, MD sent at 11/18/2018 12:00 PM EDT ----- Cardioversion at the next available time.  Follow-up in AF clinic 2 weeks after cardioversion

## 2018-11-18 NOTE — Telephone Encounter (Signed)
Call placed to Pt.  Pt scheduled for DCCV on November 21, 2018 at 9:00 am  Pt to arrive at 7:30 am for labs.  Pt going for covid test NOW  Verbal instruction given.  Will forward to afib clinic to schedule 2 week f/u

## 2018-11-18 NOTE — Telephone Encounter (Signed)
Called patient and left VM to call A-Fib Clinic to schedule 2 wk f/u after dccv.
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
## 2018-11-21 ENCOUNTER — Encounter (HOSPITAL_COMMUNITY): Payer: Self-pay

## 2018-11-21 ENCOUNTER — Other Ambulatory Visit: Payer: Self-pay

## 2018-11-21 ENCOUNTER — Encounter (HOSPITAL_COMMUNITY)
Admission: RE | Disposition: A | Discharge status: Home / Self Care | Payer: Self-pay | Source: Home / Self Care | Attending: Cardiovascular Disease

## 2018-11-21 ENCOUNTER — Ambulatory Visit (HOSPITAL_COMMUNITY)
Admission: RE | Admit: 2018-11-21 | Discharge: 2018-11-21 | Disposition: A | Discharge status: Home / Self Care | Payer: Federal, State, Local not specified - PPO | Attending: Cardiovascular Disease | Admitting: Cardiovascular Disease

## 2018-11-21 ENCOUNTER — Ambulatory Visit (HOSPITAL_COMMUNITY): Payer: Federal, State, Local not specified - PPO | Admitting: Certified Registered Nurse Anesthetist

## 2018-11-21 DIAGNOSIS — G4733 Obstructive sleep apnea (adult) (pediatric): Secondary | ICD-10-CM | POA: Diagnosis not present

## 2018-11-21 DIAGNOSIS — E0590 Thyrotoxicosis, unspecified without thyrotoxic crisis or storm: Secondary | ICD-10-CM | POA: Insufficient documentation

## 2018-11-21 DIAGNOSIS — I119 Hypertensive heart disease without heart failure: Secondary | ICD-10-CM | POA: Diagnosis not present

## 2018-11-21 DIAGNOSIS — Z7901 Long term (current) use of anticoagulants: Secondary | ICD-10-CM | POA: Diagnosis not present

## 2018-11-21 DIAGNOSIS — E785 Hyperlipidemia, unspecified: Secondary | ICD-10-CM | POA: Insufficient documentation

## 2018-11-21 DIAGNOSIS — Z6838 Body mass index (BMI) 38.0-38.9, adult: Secondary | ICD-10-CM | POA: Insufficient documentation

## 2018-11-21 DIAGNOSIS — Z8249 Family history of ischemic heart disease and other diseases of the circulatory system: Secondary | ICD-10-CM | POA: Diagnosis not present

## 2018-11-21 DIAGNOSIS — I4819 Other persistent atrial fibrillation: Secondary | ICD-10-CM | POA: Diagnosis present

## 2018-11-21 DIAGNOSIS — Z880 Allergy status to penicillin: Secondary | ICD-10-CM | POA: Diagnosis not present

## 2018-11-21 DIAGNOSIS — M1712 Unilateral primary osteoarthritis, left knee: Secondary | ICD-10-CM | POA: Insufficient documentation

## 2018-11-21 DIAGNOSIS — E669 Obesity, unspecified: Secondary | ICD-10-CM | POA: Insufficient documentation

## 2018-11-21 DIAGNOSIS — Z79890 Hormone replacement therapy (postmenopausal): Secondary | ICD-10-CM | POA: Insufficient documentation

## 2018-11-21 DIAGNOSIS — Z96652 Presence of left artificial knee joint: Secondary | ICD-10-CM | POA: Diagnosis not present

## 2018-11-21 DIAGNOSIS — I4891 Unspecified atrial fibrillation: Secondary | ICD-10-CM | POA: Diagnosis not present

## 2018-11-21 DIAGNOSIS — Z79899 Other long term (current) drug therapy: Secondary | ICD-10-CM | POA: Diagnosis not present

## 2018-11-21 DIAGNOSIS — Z87891 Personal history of nicotine dependence: Secondary | ICD-10-CM | POA: Diagnosis not present

## 2018-11-21 HISTORY — PX: CARDIOVERSION: SHX1299

## 2018-11-21 LAB — CBC WITH DIFFERENTIAL/PLATELET
Abs Immature Granulocytes: 0.03 10*3/uL (ref 0.00–0.07)
Basophils Absolute: 0 10*3/uL (ref 0.0–0.1)
Basophils Relative: 0 %
Eosinophils Absolute: 0.1 10*3/uL (ref 0.0–0.5)
Eosinophils Relative: 2 %
HCT: 53.3 % — ABNORMAL HIGH (ref 39.0–52.0)
Hemoglobin: 17.7 g/dL — ABNORMAL HIGH (ref 13.0–17.0)
Immature Granulocytes: 1 %
Lymphocytes Relative: 25 %
Lymphs Abs: 1.5 10*3/uL (ref 0.7–4.0)
MCH: 30.6 pg (ref 26.0–34.0)
MCHC: 33.2 g/dL (ref 30.0–36.0)
MCV: 92.1 fL (ref 80.0–100.0)
Monocytes Absolute: 0.7 10*3/uL (ref 0.1–1.0)
Monocytes Relative: 11 %
Neutro Abs: 3.9 10*3/uL (ref 1.7–7.7)
Neutrophils Relative %: 61 %
Platelets: 286 10*3/uL (ref 150–400)
RBC: 5.79 MIL/uL (ref 4.22–5.81)
RDW: 13.9 % (ref 11.5–15.5)
WBC: 6.2 10*3/uL (ref 4.0–10.5)
nRBC: 0 % (ref 0.0–0.2)

## 2018-11-21 LAB — POCT I-STAT, CHEM 8
BUN: 17 mg/dL (ref 8–23)
Calcium, Ion: 1.12 mmol/L — ABNORMAL LOW (ref 1.15–1.40)
Chloride: 101 mmol/L (ref 98–111)
Creatinine, Ser: 1 mg/dL (ref 0.61–1.24)
Glucose, Bld: 99 mg/dL (ref 70–99)
HCT: 54 % — ABNORMAL HIGH (ref 39.0–52.0)
Hemoglobin: 18.4 g/dL — ABNORMAL HIGH (ref 13.0–17.0)
Potassium: 3.6 mmol/L (ref 3.5–5.1)
Sodium: 140 mmol/L (ref 135–145)
TCO2: 26 mmol/L (ref 22–32)

## 2018-11-21 LAB — NOVEL CORONAVIRUS, NAA (HOSP ORDER, SEND-OUT TO REF LAB; TAT 18-24 HRS): SARS-CoV-2, NAA: NOT DETECTED

## 2018-11-21 SURGERY — CARDIOVERSION
Anesthesia: General

## 2018-11-21 MED ORDER — PROPOFOL 10 MG/ML IV BOLUS
INTRAVENOUS | Status: DC | PRN
  Administered 2018-11-21: 100 mg via INTRAVENOUS

## 2018-11-21 MED ORDER — APIXABAN 5 MG PO TABS
5.0000 mg | ORAL_TABLET | ORAL | Status: AC
  Administered 2018-11-21: 5 mg via ORAL
  Filled 2018-11-21 (×2): qty 1

## 2018-11-21 MED ORDER — LIDOCAINE 2% (20 MG/ML) 5 ML SYRINGE
INTRAMUSCULAR | Status: DC | PRN
  Administered 2018-11-21: 80 mg via INTRAVENOUS

## 2018-11-21 MED ORDER — SODIUM CHLORIDE 0.9 % IV SOLN
INTRAVENOUS | Status: DC
  Administered 2018-11-21: 09:00:00 via INTRAVENOUS

## 2018-11-21 NOTE — CV Procedure (Signed)
    Cardioversion Note  [name] QG:[id number] 1950/10/16  Procedure: DC Cardioversion Indications: atrial fibrillation  Procedure Details Consent: Obtained Time Out: Verified patient identification, verified procedure, site/side was marked, verified correct patient position, special equipment/implants available, Radiology Safety Procedures followed,  medications/allergies/relevent history reviewed, required imaging and test results available.  Performed  The patient has been on adequate anticoagulation.  The patient received IV propofol 100 mg and IV Lidocaine 80 mg for sedation.  Synchronous cardioversion was performed at 120 joules.  The cardioversion was successful.  Complications: No apparent complications Patient did tolerate procedure well.  Ian Dawley, MD, [facility] 11/21/2018, 9:40 AM

## 2018-11-21 NOTE — Anesthesia Procedure Notes (Signed)
Procedure Name: General with mask airway Performed by: Beckner, Alisha B, CRNA Pre-anesthesia Checklist: Patient identified, Emergency Drugs available, Suction available, Patient being monitored and Timeout performed Patient Re-evaluated:Patient Re-evaluated prior to induction Oxygen Delivery Method: Ambu bag Preoxygenation: Pre-oxygenation with 100% oxygen Induction Type: IV induction Ventilation: Mask ventilation without difficulty Placement Confirmation: positive ETCO2 Dental Injury: Teeth and Oropharynx as per pre-operative assessment        

## 2018-11-21 NOTE — Transfer of Care (Signed)
Immediate Anesthesia Transfer of Care Note  Patient: [name]  Procedure(s) Performed: CARDIOVERSION (N/A )  Patient Location: Endoscopy Unit  Anesthesia Type:General  Level of Consciousness: awake, alert  and oriented  Airway & Oxygen Therapy: Patient Spontanous Breathing  Post-op Assessment: Report given to RN and Post -op Vital signs reviewed and stable  Post vital signs: Reviewed and stable  Last Vitals:  Vitals Value Taken Time  BP    Temp    Pulse 57 11/21/18 0921  Resp 12 11/21/18 0921  SpO2 97 % 11/21/18 0921  Vitals shown include unvalidated device data.  Last Pain:  Vitals:   11/21/18 0825  TempSrc: Oral  PainSc: 0-No pain         Complications: No apparent anesthesia complications

## 2018-11-21 NOTE — Discharge Instructions (Signed)

## 2018-11-21 NOTE — Anesthesia Postprocedure Evaluation (Signed)
Anesthesia Post Note  Patient: [name]  Procedure(s) Performed: CARDIOVERSION (N/A )     Patient location during evaluation: PACU Anesthesia Type: General Level of consciousness: awake and alert Pain management: pain level controlled Vital Signs Assessment: post-procedure vital signs reviewed and stable Respiratory status: spontaneous breathing, nonlabored ventilation, respiratory function stable and patient connected to nasal cannula oxygen Cardiovascular status: blood pressure returned to baseline and stable Postop Assessment: no apparent nausea or vomiting Anesthetic complications: no    Last Vitals:  Vitals:   11/21/18 0825 11/21/18 0922  BP: 140/77 106/72  Pulse: 80 (!) 57  Resp: 18 12  SpO2: 100% 97%    Last Pain:  Vitals:   11/21/18 0922  TempSrc:   PainSc: 0-No pain                 India Jolin S

## 2018-11-21 NOTE — Anesthesia Preprocedure Evaluation (Signed)
Anesthesia Evaluation  Patient identified by MRN, date of birth, ID band Patient awake    Reviewed: Allergy & Precautions, NPO status , Patient's Chart, lab work & pertinent test results  Airway Mallampati: II  TM Distance: >3 FB Neck ROM: Full    Dental no notable dental hx.    Pulmonary sleep apnea and Continuous Positive Airway Pressure Ventilation , former smoker,    Pulmonary exam normal breath sounds clear to auscultation       Cardiovascular hypertension, + dysrhythmias Atrial Fibrillation  Rhythm:Irregular Rate:Normal     Neuro/Psych negative neurological ROS  negative psych ROS   GI/Hepatic negative GI ROS, Neg liver ROS,   Endo/Other  Hyperthyroidism Morbid obesity  Renal/GU negative Renal ROS  negative genitourinary   Musculoskeletal negative musculoskeletal ROS (+)   Abdominal   Peds negative pediatric ROS (+)  Hematology negative hematology ROS (+)   Anesthesia Other Findings   Reproductive/Obstetrics negative OB ROS                             Anesthesia Physical Anesthesia Plan  ASA: III  Anesthesia Plan: General   Post-op Pain Management:    Induction: Intravenous  PONV Risk Score and Plan: 0  Airway Management Planned: Mask  Additional Equipment:   Intra-op Plan:   Post-operative Plan:   Informed Consent: I have reviewed the patients History and Physical, chart, labs and discussed the procedure including the risks, benefits and alternatives for the proposed anesthesia with the patient or authorized representative who has indicated his/her understanding and acceptance.     Dental advisory given  Plan Discussed with: CRNA and Surgeon  Anesthesia Plan Comments:         Anesthesia Quick Evaluation

## 2018-11-21 NOTE — Interval H&P Note (Signed)
History and Physical Interval Note:  11/21/2018 9:07 AM  [name]  has presented today for surgery, with the diagnosis of Afib.  The various methods of treatment have been discussed with the patient and family. After consideration of risks, benefits and other options for treatment, the patient has consented to  Procedure(s): CARDIOVERSION (N/A) as a surgical intervention.  The patient's history has been reviewed, patient examined, no change in status, stable for surgery.  I have reviewed the patient's chart and labs.  Questions were answered to the patient's satisfaction.     [name]

## 2018-11-22 ENCOUNTER — Encounter (HOSPITAL_COMMUNITY): Payer: Self-pay | Admitting: Cardiology

## 2018-11-22 NOTE — Telephone Encounter (Signed)
Called and left message for patient to call [facility] to schedule appt for 2 wks.

## 2018-12-05 ENCOUNTER — Ambulatory Visit (HOSPITAL_COMMUNITY)
Admission: RE | Admit: 2018-12-05 | Discharge: 2018-12-05 | Disposition: A | Discharge status: Home / Self Care | Payer: Federal, State, Local not specified - PPO | Source: Ambulatory Visit | Attending: Nurse Practitioner | Admitting: Nurse Practitioner

## 2018-12-05 ENCOUNTER — Other Ambulatory Visit (HOSPITAL_COMMUNITY): Payer: Self-pay | Admitting: Nurse Practitioner

## 2018-12-05 ENCOUNTER — Other Ambulatory Visit: Payer: Self-pay

## 2018-12-05 ENCOUNTER — Encounter (HOSPITAL_COMMUNITY): Payer: Self-pay | Admitting: Nurse Practitioner

## 2018-12-05 VITALS — BP 152/100 | HR 112 | Ht 69.0 in | Wt 261.2 lb

## 2018-12-05 DIAGNOSIS — M1712 Unilateral primary osteoarthritis, left knee: Secondary | ICD-10-CM | POA: Insufficient documentation

## 2018-12-05 DIAGNOSIS — I10 Essential (primary) hypertension: Secondary | ICD-10-CM | POA: Diagnosis not present

## 2018-12-05 DIAGNOSIS — Z87891 Personal history of nicotine dependence: Secondary | ICD-10-CM | POA: Diagnosis not present

## 2018-12-05 DIAGNOSIS — E785 Hyperlipidemia, unspecified: Secondary | ICD-10-CM | POA: Diagnosis not present

## 2018-12-05 DIAGNOSIS — E039 Hypothyroidism, unspecified: Secondary | ICD-10-CM | POA: Diagnosis not present

## 2018-12-05 DIAGNOSIS — I4819 Other persistent atrial fibrillation: Secondary | ICD-10-CM

## 2018-12-05 DIAGNOSIS — Z7901 Long term (current) use of anticoagulants: Secondary | ICD-10-CM | POA: Insufficient documentation

## 2018-12-05 DIAGNOSIS — Z79899 Other long term (current) drug therapy: Secondary | ICD-10-CM | POA: Diagnosis not present

## 2018-12-05 DIAGNOSIS — G4733 Obstructive sleep apnea (adult) (pediatric): Secondary | ICD-10-CM | POA: Insufficient documentation

## 2018-12-05 MED ORDER — FLECAINIDE ACETATE 50 MG PO TABS: 50.0000 mg | ORAL_TABLET | Freq: Two times a day (BID) | ORAL | 0 refills | Status: DC

## 2018-12-05 NOTE — Patient Instructions (Signed)
Start Flecainide 50mg twice a day 
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
## 2018-12-07 ENCOUNTER — Other Ambulatory Visit (HOSPITAL_COMMUNITY): Payer: Self-pay | Admitting: Nurse Practitioner

## 2018-12-08 ENCOUNTER — Other Ambulatory Visit (HOSPITAL_COMMUNITY): Payer: Self-pay | Admitting: Nurse Practitioner

## 2018-12-08 ENCOUNTER — Encounter (HOSPITAL_COMMUNITY): Payer: Self-pay | Admitting: Nurse Practitioner

## 2018-12-08 ENCOUNTER — Ambulatory Visit (HOSPITAL_COMMUNITY)
Admission: RE | Admit: 2018-12-08 | Discharge: 2018-12-08 | Disposition: A | Discharge status: Home / Self Care | Payer: Federal, State, Local not specified - PPO | Source: Ambulatory Visit | Attending: Nurse Practitioner | Admitting: Nurse Practitioner

## 2018-12-08 ENCOUNTER — Other Ambulatory Visit: Payer: Self-pay

## 2018-12-08 VITALS — BP 130/80 | HR 113 | Ht 69.0 in | Wt 257.8 lb

## 2018-12-08 DIAGNOSIS — Z20828 Contact with and (suspected) exposure to other viral communicable diseases: Secondary | ICD-10-CM | POA: Insufficient documentation

## 2018-12-08 DIAGNOSIS — Z01818 Encounter for other preprocedural examination: Secondary | ICD-10-CM | POA: Insufficient documentation

## 2018-12-08 DIAGNOSIS — E785 Hyperlipidemia, unspecified: Secondary | ICD-10-CM | POA: Insufficient documentation

## 2018-12-08 DIAGNOSIS — Z79899 Other long term (current) drug therapy: Secondary | ICD-10-CM | POA: Diagnosis not present

## 2018-12-08 DIAGNOSIS — I4819 Other persistent atrial fibrillation: Secondary | ICD-10-CM | POA: Diagnosis not present

## 2018-12-08 DIAGNOSIS — I10 Essential (primary) hypertension: Secondary | ICD-10-CM | POA: Insufficient documentation

## 2018-12-08 DIAGNOSIS — G4733 Obstructive sleep apnea (adult) (pediatric): Secondary | ICD-10-CM | POA: Insufficient documentation

## 2018-12-08 DIAGNOSIS — I4892 Unspecified atrial flutter: Secondary | ICD-10-CM | POA: Diagnosis not present

## 2018-12-08 DIAGNOSIS — Z87891 Personal history of nicotine dependence: Secondary | ICD-10-CM | POA: Insufficient documentation

## 2018-12-08 DIAGNOSIS — Z7901 Long term (current) use of anticoagulants: Secondary | ICD-10-CM | POA: Diagnosis not present

## 2018-12-08 LAB — BASIC METABOLIC PANEL
Anion gap: 11 (ref 5–15)
BUN: 17 mg/dL (ref 8–23)
CO2: 23 mmol/L (ref 22–32)
Calcium: 9.5 mg/dL (ref 8.9–10.3)
Chloride: 105 mmol/L (ref 98–111)
Creatinine, Ser: 1.15 mg/dL (ref 0.61–1.24)
GFR calc Af Amer: >60 mL/min (ref >60)
GFR calc non Af Amer: >60 mL/min (ref >60)
Glucose, Bld: 113 mg/dL — ABNORMAL HIGH (ref 70–99)
Potassium: 3.8 mmol/L (ref 3.5–5.1)
Sodium: 139 mmol/L (ref 135–145)

## 2018-12-08 LAB — CBC
HCT: 55.7 % — ABNORMAL HIGH (ref 39.0–52.0)
Hemoglobin: 18.4 g/dL — ABNORMAL HIGH (ref 13.0–17.0)
MCH: 30.2 pg (ref 26.0–34.0)
MCHC: 33 g/dL (ref 30.0–36.0)
MCV: 91.5 fL (ref 80.0–100.0)
Platelets: 236 10*3/uL (ref 150–400)
RBC: 6.09 MIL/uL — ABNORMAL HIGH (ref 4.22–5.81)
RDW: 13.9 % (ref 11.5–15.5)
WBC: 7.9 10*3/uL (ref 4.0–10.5)
nRBC: 0 % (ref 0.0–0.2)

## 2018-12-08 MED ORDER — FLECAINIDE ACETATE 100 MG PO TABS: 100.0000 mg | ORAL_TABLET | Freq: Two times a day (BID) | ORAL | 1 refills | Status: DC
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
## 2018-12-08 NOTE — Patient Instructions (Addendum)
Cardioversion scheduled for Wednesday 12/14/2018  - Arrive at the [place] and go to admitting at 10:30am  -Do not eat or drink anything after midnight the night prior to your procedure.  - Take all your morning medication with a sip of water prior to arrival.  - You will not be able to drive home after your procedure.  Increase Flecainide 100mg  twice a day
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
## 2018-12-10 ENCOUNTER — Other Ambulatory Visit (HOSPITAL_COMMUNITY)
Admission: RE | Admit: 2018-12-10 | Discharge: 2018-12-10 | Disposition: A | Discharge status: Home / Self Care | Payer: Federal, State, Local not specified - PPO | Source: Ambulatory Visit | Attending: Cardiology | Admitting: Cardiology

## 2018-12-10 DIAGNOSIS — Z01810 Encounter for preprocedural cardiovascular examination: Secondary | ICD-10-CM | POA: Diagnosis present

## 2018-12-10 DIAGNOSIS — Z01812 Encounter for preprocedural laboratory examination: Secondary | ICD-10-CM | POA: Diagnosis not present

## 2018-12-10 DIAGNOSIS — Z20828 Contact with and (suspected) exposure to other viral communicable diseases: Secondary | ICD-10-CM | POA: Diagnosis not present

## 2018-12-11 LAB — NOVEL CORONAVIRUS, NAA (HOSP ORDER, SEND-OUT TO REF LAB; TAT 18-24 HRS): SARS-CoV-2, NAA: NOT DETECTED

## 2018-12-14 ENCOUNTER — Other Ambulatory Visit: Payer: Self-pay

## 2018-12-14 ENCOUNTER — Ambulatory Visit (HOSPITAL_COMMUNITY)
Admission: RE | Admit: 2018-12-14 | Discharge: 2018-12-14 | Disposition: A | Discharge status: Home / Self Care | Payer: Federal, State, Local not specified - PPO | Attending: Cardiology | Admitting: Cardiology

## 2018-12-14 ENCOUNTER — Ambulatory Visit (HOSPITAL_COMMUNITY): Payer: Federal, State, Local not specified - PPO | Admitting: Certified Registered Nurse Anesthetist

## 2018-12-14 ENCOUNTER — Encounter (HOSPITAL_COMMUNITY): Payer: Self-pay | Admitting: Anesthesiology

## 2018-12-14 ENCOUNTER — Encounter (HOSPITAL_COMMUNITY)
Admission: RE | Disposition: A | Discharge status: Home / Self Care | Payer: Self-pay | Source: Home / Self Care | Attending: Cardiology

## 2018-12-14 DIAGNOSIS — E669 Obesity, unspecified: Secondary | ICD-10-CM | POA: Insufficient documentation

## 2018-12-14 DIAGNOSIS — Z91040 Latex allergy status: Secondary | ICD-10-CM | POA: Insufficient documentation

## 2018-12-14 DIAGNOSIS — Z6838 Body mass index (BMI) 38.0-38.9, adult: Secondary | ICD-10-CM | POA: Diagnosis not present

## 2018-12-14 DIAGNOSIS — Z79890 Hormone replacement therapy (postmenopausal): Secondary | ICD-10-CM | POA: Diagnosis not present

## 2018-12-14 DIAGNOSIS — I10 Essential (primary) hypertension: Secondary | ICD-10-CM | POA: Diagnosis not present

## 2018-12-14 DIAGNOSIS — Z8249 Family history of ischemic heart disease and other diseases of the circulatory system: Secondary | ICD-10-CM | POA: Insufficient documentation

## 2018-12-14 DIAGNOSIS — E785 Hyperlipidemia, unspecified: Secondary | ICD-10-CM | POA: Insufficient documentation

## 2018-12-14 DIAGNOSIS — I4892 Unspecified atrial flutter: Secondary | ICD-10-CM

## 2018-12-14 DIAGNOSIS — I4819 Other persistent atrial fibrillation: Secondary | ICD-10-CM | POA: Insufficient documentation

## 2018-12-14 DIAGNOSIS — Z87891 Personal history of nicotine dependence: Secondary | ICD-10-CM | POA: Diagnosis not present

## 2018-12-14 DIAGNOSIS — Z79899 Other long term (current) drug therapy: Secondary | ICD-10-CM | POA: Insufficient documentation

## 2018-12-14 DIAGNOSIS — E0590 Thyrotoxicosis, unspecified without thyrotoxic crisis or storm: Secondary | ICD-10-CM | POA: Insufficient documentation

## 2018-12-14 DIAGNOSIS — Z880 Allergy status to penicillin: Secondary | ICD-10-CM | POA: Insufficient documentation

## 2018-12-14 DIAGNOSIS — Z7901 Long term (current) use of anticoagulants: Secondary | ICD-10-CM | POA: Diagnosis not present

## 2018-12-14 DIAGNOSIS — G4733 Obstructive sleep apnea (adult) (pediatric): Secondary | ICD-10-CM | POA: Diagnosis not present

## 2018-12-14 DIAGNOSIS — M1712 Unilateral primary osteoarthritis, left knee: Secondary | ICD-10-CM | POA: Insufficient documentation

## 2018-12-14 HISTORY — PX: CARDIOVERSION: SHX1299

## 2018-12-14 SURGERY — CARDIOVERSION
Anesthesia: General

## 2018-12-14 MED ORDER — PROPOFOL 10 MG/ML IV BOLUS
INTRAVENOUS | Status: DC | PRN
  Administered 2018-12-14: 60 mg via INTRAVENOUS

## 2018-12-14 MED ORDER — LIDOCAINE 2% (20 MG/ML) 5 ML SYRINGE
INTRAMUSCULAR | Status: DC | PRN
  Administered 2018-12-14: 40 mg via INTRAVENOUS

## 2018-12-14 MED ORDER — SODIUM CHLORIDE 0.9 % IV SOLN
INTRAVENOUS | Status: DC | PRN
  Administered 2018-12-14: 11:00:00 via INTRAVENOUS

## 2018-12-14 NOTE — Transfer of Care (Signed)
Immediate Anesthesia Transfer of Care Note  Patient: [name]  Procedure(s) Performed: CARDIOVERSION (N/A )  Patient Location: Endoscopy Unit  Anesthesia Type:General  Level of Consciousness: drowsy and patient cooperative  Airway & Oxygen Therapy: Patient Spontanous Breathing  Post-op Assessment: Report given to RN, Post -op Vital signs reviewed and stable and Patient moving all extremities X 4  Post vital signs: Reviewed and stable  Last Vitals:  Vitals Value Taken Time  BP    Temp    Pulse    Resp    SpO2      Last Pain:  Vitals:   12/14/18 1056  TempSrc: Temporal  PainSc: 0-No pain         Complications: No apparent anesthesia complications

## 2018-12-14 NOTE — Anesthesia Preprocedure Evaluation (Signed)
Anesthesia Evaluation    Reviewed: Allergy & Precautions, Patient's Chart, lab work & pertinent test results  Airway Mallampati: II  TM Distance: >3 FB Neck ROM: Full    Dental  (+) Teeth Intact, Dental Advisory Given   Pulmonary sleep apnea , former smoker,    breath sounds clear to auscultation       Cardiovascular hypertension, Pt. on medications + dysrhythmias Atrial Fibrillation  Rhythm:Irregular Rate:Normal     Neuro/Psych negative neurological ROS  negative psych ROS   GI/Hepatic negative GI ROS, Neg liver ROS,   Endo/Other  Hyperthyroidism   Renal/GU Renal InsufficiencyRenal disease     Musculoskeletal  (+) Arthritis ,   Abdominal (+) + obese,   Peds  Hematology negative hematology ROS (+)   Anesthesia Other Findings   Reproductive/Obstetrics                             Anesthesia Physical Anesthesia Plan  ASA: III  Anesthesia Plan: General   Post-op Pain Management:    Induction: Intravenous  PONV Risk Score and Plan: 1 and Treatment may vary due to age or medical condition  Airway Management Planned: Natural Airway and Simple Face Mask  Additional Equipment: None  Intra-op Plan:   Post-operative Plan:   Informed Consent: I have reviewed the patients History and Physical, chart, labs and discussed the procedure including the risks, benefits and alternatives for the proposed anesthesia with the patient or authorized representative who has indicated his/her understanding and acceptance.       Plan Discussed with: CRNA  Anesthesia Plan Comments:         Anesthesia Quick Evaluation

## 2018-12-14 NOTE — Anesthesia Postprocedure Evaluation (Signed)
Anesthesia Post Note  Patient: [name]  Procedure(s) Performed: CARDIOVERSION (N/A )     Patient location during evaluation: PACU Anesthesia Type: General Level of consciousness: awake and alert Pain management: pain level controlled Vital Signs Assessment: post-procedure vital signs reviewed and stable Respiratory status: spontaneous breathing, nonlabored ventilation, respiratory function stable and patient connected to nasal cannula oxygen Cardiovascular status: blood pressure returned to baseline and stable Postop Assessment: no apparent nausea or vomiting Anesthetic complications: no    Last Vitals:  Vitals:   12/14/18 1140 12/14/18 1150  BP: 126/74 128/77  Pulse: 70 65  Resp: 11 11  Temp:    SpO2: 95% 97%    Last Pain:  Vitals:   12/14/18 1150  TempSrc:   PainSc: 0-No pain                 Effie Berkshire

## 2018-12-14 NOTE — Discharge Instructions (Signed)
Call your cardiologist with any questions or concerns.    Electrical Cardioversion, Care After This sheet gives you information about how to care for yourself after your procedure. Your health care provider may also give you more specific instructions. If you have problems or questions, contact your health care provider. What can I expect after the procedure? After the procedure, it is common to have:  Some redness on the skin where the shocks were given. Follow these instructions at home:   Do not drive for 24 hours if you were given a medicine to help you relax (sedative).  Take over-the-counter and prescription medicines only as told by your health care provider.  Ask your health care provider how to check your pulse. Check it often.  Rest for 48 hours after the procedure or as told by your health care provider.  Avoid or limit your caffeine use as told by your health care provider. Contact a health care provider if:  You feel like your heart is beating too quickly or your pulse is not regular.  You have a serious muscle cramp that does not go away. Get help right away if:   You have discomfort in your chest.  You are dizzy or you feel faint.  You have trouble breathing or you are short of breath.  Your speech is slurred.  You have trouble moving an arm or leg on one side of your body.  Your fingers or toes turn cold or blue. This information is not intended to replace advice given to you by your health care provider. Make sure you discuss any questions you have with your health care provider. Document Released: 11/16/2012 Document Revised: 01/08/2017 Document Reviewed: 08/02/2015 Elsevier Patient Education  2020 [employer].

## 2018-12-14 NOTE — Interval H&P Note (Signed)
History and Physical Interval Note:  12/14/2018 10:41 AM  Rik R Wattenbarger  has presented today for surgery, with the diagnosis of A-FIB.  The various methods of treatment have been discussed with the patient and family. After consideration of risks, benefits and other options for treatment, the patient has consented to  Procedure(s): CARDIOVERSION (N/A) as a surgical intervention.  The patient's history has been reviewed, patient examined, no change in status, stable for surgery.  I have reviewed the patient's chart and labs.  Questions were answered to the patient's satisfaction.     [employer]

## 2018-12-14 NOTE — CV Procedure (Signed)
    Electrical Cardioversion Procedure Note [name] [id number] [birth year]  Procedure: Electrical Cardioversion Indications:  Atrial Flutter  Time Out: Verified patient identification, verified procedure,medications/allergies/relevent history reviewed, required imaging and test results available Time out performed  Procedure Details  The patient was NPO after midnight. Anesthesia was administered at the beside  by Dr.Hollis with propofol.  Cardioversion was performed with synchronized biphasic defibrillation via AP pads with 120 joules.  1 attempt(s) were performed.  The patient converted to normal sinus rhythm. The patient tolerated the procedure well   IMPRESSION:  Successful cardioversion of atrial flutter.   [name] 12/14/2018, 11:20 AM

## 2018-12-15 ENCOUNTER — Encounter (HOSPITAL_COMMUNITY): Payer: Self-pay | Admitting: Cardiology

## 2018-12-21 ENCOUNTER — Encounter (HOSPITAL_COMMUNITY): Payer: Self-pay | Admitting: Nurse Practitioner

## 2018-12-21 ENCOUNTER — Other Ambulatory Visit: Payer: Self-pay

## 2018-12-21 ENCOUNTER — Ambulatory Visit (HOSPITAL_COMMUNITY)
Admission: RE | Admit: 2018-12-21 | Discharge: 2018-12-21 | Disposition: A | Discharge status: Home / Self Care | Payer: Federal, State, Local not specified - PPO | Source: Ambulatory Visit | Attending: Nurse Practitioner | Admitting: Nurse Practitioner

## 2018-12-21 VITALS — BP 130/66 | HR 58 | Ht 69.0 in | Wt 258.2 lb

## 2018-12-21 DIAGNOSIS — Z87891 Personal history of nicotine dependence: Secondary | ICD-10-CM | POA: Insufficient documentation

## 2018-12-21 DIAGNOSIS — Z8249 Family history of ischemic heart disease and other diseases of the circulatory system: Secondary | ICD-10-CM | POA: Diagnosis not present

## 2018-12-21 DIAGNOSIS — E0590 Thyrotoxicosis, unspecified without thyrotoxic crisis or storm: Secondary | ICD-10-CM | POA: Insufficient documentation

## 2018-12-21 DIAGNOSIS — Z823 Family history of stroke: Secondary | ICD-10-CM | POA: Insufficient documentation

## 2018-12-21 DIAGNOSIS — I4819 Other persistent atrial fibrillation: Secondary | ICD-10-CM | POA: Insufficient documentation

## 2018-12-21 DIAGNOSIS — Z6838 Body mass index (BMI) 38.0-38.9, adult: Secondary | ICD-10-CM | POA: Insufficient documentation

## 2018-12-21 DIAGNOSIS — G4733 Obstructive sleep apnea (adult) (pediatric): Secondary | ICD-10-CM | POA: Insufficient documentation

## 2018-12-21 DIAGNOSIS — Z7901 Long term (current) use of anticoagulants: Secondary | ICD-10-CM | POA: Insufficient documentation

## 2018-12-21 DIAGNOSIS — Z91040 Latex allergy status: Secondary | ICD-10-CM | POA: Diagnosis not present

## 2018-12-21 DIAGNOSIS — E669 Obesity, unspecified: Secondary | ICD-10-CM | POA: Insufficient documentation

## 2018-12-21 DIAGNOSIS — I10 Essential (primary) hypertension: Secondary | ICD-10-CM | POA: Diagnosis not present

## 2018-12-21 DIAGNOSIS — I440 Atrioventricular block, first degree: Secondary | ICD-10-CM | POA: Insufficient documentation

## 2018-12-21 DIAGNOSIS — Z880 Allergy status to penicillin: Secondary | ICD-10-CM | POA: Diagnosis not present

## 2018-12-21 DIAGNOSIS — D6869 Other thrombophilia: Secondary | ICD-10-CM | POA: Diagnosis not present

## 2018-12-21 DIAGNOSIS — Z79890 Hormone replacement therapy (postmenopausal): Secondary | ICD-10-CM | POA: Insufficient documentation

## 2018-12-21 DIAGNOSIS — R001 Bradycardia, unspecified: Secondary | ICD-10-CM | POA: Diagnosis not present

## 2018-12-21 DIAGNOSIS — M1712 Unilateral primary osteoarthritis, left knee: Secondary | ICD-10-CM | POA: Diagnosis not present

## 2018-12-21 DIAGNOSIS — Z79899 Other long term (current) drug therapy: Secondary | ICD-10-CM | POA: Insufficient documentation
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
## 2019-02-21 ENCOUNTER — Telehealth: Payer: Self-pay | Admitting: Cardiovascular Disease

## 2019-02-21 NOTE — Telephone Encounter (Signed)
Pt called  just had Physical with Dr Shelia Media and Dr Shelia Media started pt on low dose statin Pt wanted to let Dr Gwenlyn Found know.Will forward to Dr Gwenlyn Found

## 2019-02-21 NOTE — Telephone Encounter (Signed)
New Message:      Please call, pt just had his physical with his primary doctor. He wants to give you an update on what is going on.

## 2019-02-21 NOTE — Telephone Encounter (Signed)
Given his elevated LDL I think this was a good idea.

## 2019-04-06 ENCOUNTER — Ambulatory Visit: Payer: Federal, State, Local not specified - PPO | Attending: Internal Medicine

## 2019-04-06 DIAGNOSIS — Z23 Encounter for immunization: Secondary | ICD-10-CM | POA: Insufficient documentation

## 2019-04-06 NOTE — Progress Notes (Signed)
   Covid-19 Vaccination Clinic  Name:  [name]    MRN: [id number] DOB: 1950-12-04  04/06/2019  [name] was observed post Covid-19 immunization for 15 minutes without incidence. He was provided with Vaccine Information Sheet and instruction to access the V-Safe system.   [name] was instructed to call 911 with any severe reactions post vaccine: Marland Kitchen Difficulty breathing  . Swelling of your face and throat  . A fast heartbeat  . A bad rash all over your body  . Dizziness and weakness    Immunizations Administered    Name Date Dose VIS Date Route   Pfizer COVID-19 Vaccine 04/06/2019  1:59 PM 0.3 mL 01/20/2019 Intramuscular   Manufacturer: [place]   Lot: [id number]   [place]: [id number]

## 2019-04-13 ENCOUNTER — Other Ambulatory Visit: Payer: Self-pay

## 2019-04-13 ENCOUNTER — Encounter: Payer: Self-pay | Admitting: Cardiovascular Disease

## 2019-04-13 ENCOUNTER — Ambulatory Visit: Payer: Federal, State, Local not specified - PPO | Admitting: Cardiovascular Disease

## 2019-04-13 DIAGNOSIS — G4733 Obstructive sleep apnea (adult) (pediatric): Secondary | ICD-10-CM

## 2019-04-13 DIAGNOSIS — E782 Mixed hyperlipidemia: Secondary | ICD-10-CM | POA: Diagnosis not present

## 2019-04-13 DIAGNOSIS — I10 Essential (primary) hypertension: Secondary | ICD-10-CM | POA: Diagnosis not present

## 2019-04-13 DIAGNOSIS — I480 Paroxysmal atrial fibrillation: Secondary | ICD-10-CM | POA: Diagnosis not present

## 2019-04-13 NOTE — Patient Instructions (Signed)

## 2019-04-13 NOTE — Assessment & Plan Note (Signed)
History of hyperlipidemia on Crestor followed by his PCP 

## 2019-04-13 NOTE — Assessment & Plan Note (Signed)
History of PAF on flecainide and Eliquis status post multiple cardioversions maintaining sinus rhythm.

## 2019-04-13 NOTE — Progress Notes (Signed)
04/13/2019 [place]   05-11-1950  [id number]  Primary Physician Deland Pretty, MD Primary Cardiologist: Lorretta Harp MD [name], [place]  HPI:  [name] is a 69 y.o.  married Caucasian male father of 3 daughters referred by Dr.Pharrfor cardiovascular evaluation because of new onset A. Fib.I last saw him in the office  11/04/2018.I actually took care of [name] 4 to 5 years ago and prior to that back in 2007. He has a history of treated hypertension and untreated mild hyperlipidemia as well as family history of heart disease with maternal grandfather who had CABG in his 20s and a father who had a myocardial infarction in his 14s. He had negative stress test in the past both in 2007 and again in 2015. 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 Dr. Marcelle Smiling his thyroid functions. He is on thyroid replacement therapy. He is recently been demonstrated to be in atrial fibrillation which he thinks is been in for several months and was begun on Eliquis oral anticoagulation. He also has obstructive sleep apnea on CPAP. He does drink ice tea several times a day decaffeinated coffee. He does not drink alcohol.  He underwent successful outpatient DC cardioversion by Dr. Debara Pickett 01/17/2018 with marked 1 shock restoring sinus rhythm. He felt clinically improved after that. He has remained on Eliquis oral anticoagulation. He suspects he went into brief PAF yesterday after he had outpatient surgery and is back on his Eliquis. He did have a negative GXT on 02/04/2018 as well as a 2D echo that showed normal normal LV systolic function, mild LV cavity dilatation mild LVH with moderate left atrial enlargement.  Since I saw him 6 months ago he he underwent DC cardioversion twice, one by Dr. Meda Coffee and once by Dr. Marlou Porch.  He was placed on flecainide and has maintained  sinus rhythm.  He denies chest pain or shortness of breath.   Current Meds  Medication Sig  . apixaban (ELIQUIS) 5 MG TABS tablet Take 5 mg by mouth 2 (two) times daily.  . cholecalciferol (VITAMIN D3) 25 MCG (1000 UT) tablet Take 1,000 Units by mouth daily.  Marland Kitchen diltiazem (CARDIZEM CD) 120 MG 24 hr capsule Take 1 capsule (120 mg total) by mouth daily.  . flecainide (TAMBOCOR) 100 MG tablet TAKE 1 TABLET(100 MG) BY MOUTH TWICE DAILY  . levothyroxine (SYNTHROID, LEVOTHROID) 175 MCG tablet Take 175 mcg by mouth daily before breakfast.   . Multiple Vitamins-Minerals (PRESERVISION AREDS 2+MULTI VIT PO) Take 1 capsule by mouth 2 (two) times daily.  Marland Kitchen OVER THE COUNTER MEDICATION Take 1 capsule by mouth daily. Zyflamend Whole Body (10-herb blend for pain relief/inflammation--Rosemary,Turmeric, Green Tea, Ginseng, Chinese Skullcap, [place], [place], [place], holly basil, organic oregano)  . rosuvastatin (CRESTOR) 10 MG tablet Take 10 mg by mouth daily.  . sertraline (ZOLOFT) 100 MG tablet Take 50 mg by mouth daily.   Marland Kitchen telmisartan-hydrochlorothiazide (MICARDIS HCT) 80-25 MG tablet Take 1 tablet by mouth daily.  Marland Kitchen testosterone cypionate (DEPOTESTOSTERONE CYPIONATE) 200 MG/ML injection Inject 200 mg into the muscle every 14 (fourteen) days.   . vitamin C (ASCORBIC ACID) 500 MG tablet Take 500 mg by mouth daily.     Allergies  Allergen Reactions  . Penicillins Anaphylaxis    Pt reports throat swelling, difficulty breathing at age 72 Has patient had a PCN reaction causing  immediate rash, facial/tongue/throat swelling, SOB or lightheadedness with hypotension: yes Has patient had a PCN reaction causing severe rash involving mucus membranes or skin necrosis: no Has patient had a PCN reaction that required hospitalization no Has patient had a PCN reaction occurring within the last 10 years: no If all of the above answers are "NO", then may proceed with Cephalosporin use.   . Latex Rash     Social History   Socioeconomic History  . Marital status: Married    Spouse name: Not on file  . Number of children: Not on file  . Years of education: Not on file  . Highest education level: Not on file  Occupational History  . Not on file  Tobacco Use  . Smoking status: Former Smoker    Types: Pipe  . Smokeless tobacco: Never Used  . Tobacco comment: quit 40 years ago  Substance and Sexual Activity  . Alcohol use: Yes    Comment: rarely , glass of wine once a year  . Drug use: No  . Sexual activity: Yes  Other Topics Concern  . Not on file  Social History Narrative   Lives in [place] with spouse.    Retired from the [employer].   Social Determinants of Health   Financial Resource Strain:   . Difficulty of Paying Living Expenses: Not on file  Food Insecurity:   . Worried About Charity fundraiser in the Last Year: Not on file  . Ran Out of Food in the Last Year: Not on file  Transportation Needs:   . Lack of Transportation (Medical): Not on file  . Lack of Transportation (Non-Medical): Not on file  Physical Activity:   . Days of Exercise per Week: Not on file  . Minutes of Exercise per Session: Not on file  Stress:   . Feeling of Stress : Not on file  Social Connections:   . Frequency of Communication with Friends and Family: Not on file  . Frequency of Social Gatherings with Friends and Family: Not on file  . Attends Religious Services: Not on file  . Active Member of Clubs or Organizations: Not on file  . Attends Archivist Meetings: Not on file  . Marital Status: Not on file  Intimate Partner Violence:   . Fear of Current or Ex-Partner: Not on file  . Emotionally Abused: Not on file  . Physically Abused: Not on file  . Sexually Abused: Not on file     Review of Systems: General: negative for chills, fever, night sweats or weight changes.  Cardiovascular: negative for chest pain, dyspnea on exertion, edema, orthopnea, palpitations,  paroxysmal nocturnal dyspnea or shortness of breath Dermatological: negative for rash Respiratory: negative for cough or wheezing Urologic: negative for hematuria Abdominal: negative for nausea, vomiting, diarrhea, bright red blood per rectum, melena, or hematemesis Neurologic: negative for visual changes, syncope, or dizziness All other systems reviewed and are otherwise negative except as noted above.    Blood pressure 136/70, pulse (!) 51, temperature 98.2 F (36.8 C), height 5\' 9"  (1.753 m), weight 252 lb (114.3 kg), SpO2 97 %.  General appearance: alert and no distress Neck: no adenopathy, no carotid bruit, no JVD, supple, symmetrical, trachea midline and thyroid not enlarged, symmetric, no tenderness/mass/nodules Lungs: clear to auscultation bilaterally Heart: regular rate and rhythm, S1, S2 normal, no murmur, click, rub or gallop Extremities: extremities normal, atraumatic, no cyanosis or edema Pulses: 2+ and symmetric Skin: Skin color, texture, turgor normal.  No rashes or lesions Neurologic: Alert and oriented X 3, normal strength and tone. Normal symmetric reflexes. Normal coordination and gait  EKG not performed today  ASSESSMENT AND PLAN:   HTN (hypertension) History of essential hypertension with blood pressure measured at 136/70.  He is on diltiazem, Micardis and hydrochlorothiazide.  HLD (hyperlipidemia) History of hyperlipidemia on Crestor followed by his PCP  Obstructive sleep apnea History of obstructive sleep apnea on CPAP which he benefits from  Paroxysmal atrial fibrillation (HCC) History of PAF on flecainide and Eliquis status post multiple cardioversions maintaining sinus rhythm.      Lorretta Harp MD FACP,FACC,FAHA, [facility] 04/13/2019 1:59 PM

## 2019-04-13 NOTE — Assessment & Plan Note (Signed)
History of essential hypertension with blood pressure measured at 136/70.  He is on diltiazem, Micardis and hydrochlorothiazide.

## 2019-04-13 NOTE — Assessment & Plan Note (Addendum)
History of obstructive sleep apnea on CPAP which he benefits from 

## 2019-05-02 ENCOUNTER — Ambulatory Visit: Payer: Federal, State, Local not specified - PPO | Attending: Internal Medicine

## 2019-05-02 DIAGNOSIS — Z23 Encounter for immunization: Secondary | ICD-10-CM

## 2019-05-02 NOTE — Progress Notes (Signed)
   Covid-19 Vaccination Clinic  Name:  [name]    MRN: [id number] DOB: Oct 24, 1950  05/02/2019  [name] was observed post Covid-19 immunization for 30 minutes based on pre-vaccination screening without incident. He was provided with Vaccine Information Sheet and instruction to access the V-Safe system.   [name] was instructed to call 911 with any severe reactions post vaccine: Marland Kitchen Difficulty breathing  . Swelling of face and throat  . A fast heartbeat  . A bad rash all over body  . Dizziness and weakness   Immunizations Administered    Name Date Dose VIS Date Route   Pfizer COVID-19 Vaccine 05/02/2019  9:06 AM 0.3 mL 01/20/2019 Intramuscular   Manufacturer: [place]   Lot: [id number]   [place]: [id number]

## 2019-06-11 ENCOUNTER — Other Ambulatory Visit (HOSPITAL_COMMUNITY): Payer: Self-pay | Admitting: Nurse Practitioner

## 2019-11-08 ENCOUNTER — Other Ambulatory Visit: Payer: Self-pay | Admitting: Cardiovascular Disease

## 2019-11-22 ENCOUNTER — Ambulatory Visit: Payer: Federal, State, Local not specified - PPO | Admitting: Orthopaedic Surgery

## 2019-11-22 ENCOUNTER — Ambulatory Visit: Payer: Self-pay

## 2019-11-22 DIAGNOSIS — M25511 Pain in right shoulder: Secondary | ICD-10-CM | POA: Diagnosis not present

## 2019-11-22 MED ORDER — METHYLPREDNISOLONE ACETATE 40 MG/ML IJ SUSP
40.0000 mg | INTRAMUSCULAR | Status: AC | PRN
  Administered 2019-11-22: 40 mg via INTRAMUSCULAR

## 2019-11-22 MED ORDER — METHYLPREDNISOLONE ACETATE 40 MG/ML IJ SUSP
40.0000 mg | INTRAMUSCULAR | Status: AC | PRN
  Administered 2019-11-22: 40 mg via INTRA_ARTICULAR

## 2019-11-22 MED ORDER — LIDOCAINE HCL 1 % IJ SOLN
3.0000 mL | INTRAMUSCULAR | Status: AC | PRN
  Administered 2019-11-22: 3 mL

## 2019-11-22 MED ORDER — LIDOCAINE HCL 1 % IJ SOLN
1.0000 mL | INTRAMUSCULAR | Status: AC | PRN
  Administered 2019-11-22: 1 mL
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
## 2019-12-19 ENCOUNTER — Other Ambulatory Visit (HOSPITAL_COMMUNITY): Payer: Self-pay | Admitting: Internal Medicine

## 2019-12-20 ENCOUNTER — Other Ambulatory Visit: Payer: Self-pay

## 2019-12-20 ENCOUNTER — Other Ambulatory Visit (HOSPITAL_COMMUNITY): Payer: Self-pay | Admitting: Internal Medicine

## 2019-12-20 ENCOUNTER — Encounter: Payer: Self-pay | Admitting: Orthopaedic Surgery

## 2019-12-20 ENCOUNTER — Ambulatory Visit: Payer: Federal, State, Local not specified - PPO | Admitting: Orthopaedic Surgery

## 2019-12-20 DIAGNOSIS — M25511 Pain in right shoulder: Secondary | ICD-10-CM | POA: Diagnosis not present

## 2019-12-20 MED ORDER — METHYLPREDNISOLONE 4 MG PO TABS: ORAL_TABLET | ORAL | 0 refills | Status: DC

## 2019-12-20 NOTE — Progress Notes (Signed)
The patient comes in with continued right shoulder pain.  He is a 69 year old gentleman and I did inject his shoulder at his last visit about a month ago.  He still has pain that wakes him up at night.  When he holds his shoulder forward flexed position at 90 degrees of abduction he gets a lot of pain around the posterior aspect of the shoulder.  He is getting some numbness and tingling in his fingers as well.  He cannot take anti-inflammatories because he is on Eliquis.  He is a very active individual.  On exam his right shoulder is well located but definitely has signs of impingement with positive Neer and Hawkins signs with good rotator cuff strength.  I will try a 6-day steroid taper and would like to send him to outpatient physical therapy to work on any modalities that can decrease the shoulder pain.  They can also address his neck if he is continue to have any pain around the parascapular area or even numbness and tingling into his right hand.  He agrees with this treatment plan.  He also feels that his mattress is affecting his sleep at night.  I will see him back in about 6 weeks and I would consider repeat injection at that visit as well.

## 2020-01-03 ENCOUNTER — Other Ambulatory Visit: Payer: Self-pay

## 2020-01-03 ENCOUNTER — Ambulatory Visit: Payer: Federal, State, Local not specified - PPO | Admitting: Rehabilitative and Restorative Service Providers"

## 2020-01-03 ENCOUNTER — Encounter: Payer: Self-pay | Admitting: Rehabilitative and Restorative Service Providers"

## 2020-01-03 DIAGNOSIS — M6281 Muscle weakness (generalized): Secondary | ICD-10-CM | POA: Diagnosis not present

## 2020-01-03 DIAGNOSIS — R293 Abnormal posture: Secondary | ICD-10-CM | POA: Diagnosis not present

## 2020-01-03 DIAGNOSIS — M25511 Pain in right shoulder: Secondary | ICD-10-CM

## 2020-01-03 DIAGNOSIS — G8929 Other chronic pain: Secondary | ICD-10-CM

## 2020-01-03 DIAGNOSIS — M542 Cervicalgia: Secondary | ICD-10-CM
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
## 2020-01-03 NOTE — Patient Instructions (Signed)
Access Code: [email or web address] URL: [email or web address] Date: 01/03/2020 Prepared by: Scot Jun  Exercises Standing Shoulder Row with Anchored Resistance - 2 x daily - 7 x weekly - 10 reps - 3 sets Shoulder Extension with Resistance - 2 x daily - 7 x weekly - 10 reps - 3 sets Standing Shoulder Posterior Capsule Stretch - 2 x daily - 7 x weekly - 1 sets - 5 reps - 15-30 hold Shoulder External Rotation with Anchored Resistance - 2 x daily - 7 x weekly - 3 sets - 10 reps

## 2020-01-08 ENCOUNTER — Encounter: Payer: Self-pay | Admitting: Rehabilitative and Restorative Service Providers"

## 2020-01-08 ENCOUNTER — Other Ambulatory Visit: Payer: Self-pay

## 2020-01-08 ENCOUNTER — Ambulatory Visit: Payer: Federal, State, Local not specified - PPO | Admitting: Rehabilitative and Restorative Service Providers"

## 2020-01-08 DIAGNOSIS — G8929 Other chronic pain: Secondary | ICD-10-CM

## 2020-01-08 DIAGNOSIS — M6281 Muscle weakness (generalized): Secondary | ICD-10-CM | POA: Diagnosis not present

## 2020-01-08 DIAGNOSIS — M25511 Pain in right shoulder: Secondary | ICD-10-CM | POA: Diagnosis not present

## 2020-01-08 DIAGNOSIS — R293 Abnormal posture: Secondary | ICD-10-CM | POA: Diagnosis not present

## 2020-01-08 DIAGNOSIS — M542 Cervicalgia: Secondary | ICD-10-CM
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
## 2020-01-11 ENCOUNTER — Ambulatory Visit: Payer: Federal, State, Local not specified - PPO | Admitting: Rehabilitative and Restorative Service Providers"

## 2020-01-11 ENCOUNTER — Other Ambulatory Visit: Payer: Self-pay

## 2020-01-11 DIAGNOSIS — G8929 Other chronic pain: Secondary | ICD-10-CM

## 2020-01-11 DIAGNOSIS — R293 Abnormal posture: Secondary | ICD-10-CM | POA: Diagnosis not present

## 2020-01-11 DIAGNOSIS — M25511 Pain in right shoulder: Secondary | ICD-10-CM

## 2020-01-11 DIAGNOSIS — M6281 Muscle weakness (generalized): Secondary | ICD-10-CM

## 2020-01-11 DIAGNOSIS — M542 Cervicalgia: Secondary | ICD-10-CM
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
## 2020-01-15 ENCOUNTER — Other Ambulatory Visit: Payer: Self-pay

## 2020-01-15 ENCOUNTER — Ambulatory Visit: Payer: Federal, State, Local not specified - PPO | Admitting: Rehabilitative and Restorative Service Providers"

## 2020-01-15 ENCOUNTER — Encounter: Payer: Self-pay | Admitting: Rehabilitative and Restorative Service Providers"

## 2020-01-15 DIAGNOSIS — M6281 Muscle weakness (generalized): Secondary | ICD-10-CM | POA: Diagnosis not present

## 2020-01-15 DIAGNOSIS — M542 Cervicalgia: Secondary | ICD-10-CM

## 2020-01-15 DIAGNOSIS — R293 Abnormal posture: Secondary | ICD-10-CM | POA: Diagnosis not present

## 2020-01-15 DIAGNOSIS — G8929 Other chronic pain: Secondary | ICD-10-CM

## 2020-01-15 DIAGNOSIS — M25511 Pain in right shoulder: Secondary | ICD-10-CM
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
## 2020-01-18 ENCOUNTER — Encounter: Payer: Federal, State, Local not specified - PPO | Admitting: Rehabilitative and Restorative Service Providers"

## 2020-01-23 ENCOUNTER — Encounter: Payer: Self-pay | Admitting: Rehabilitative and Restorative Service Providers"

## 2020-01-23 ENCOUNTER — Ambulatory Visit: Payer: Federal, State, Local not specified - PPO | Admitting: Rehabilitative and Restorative Service Providers"

## 2020-01-23 ENCOUNTER — Other Ambulatory Visit: Payer: Self-pay

## 2020-01-23 DIAGNOSIS — R293 Abnormal posture: Secondary | ICD-10-CM

## 2020-01-23 DIAGNOSIS — M6281 Muscle weakness (generalized): Secondary | ICD-10-CM

## 2020-01-23 DIAGNOSIS — M25511 Pain in right shoulder: Secondary | ICD-10-CM | POA: Diagnosis not present

## 2020-01-23 DIAGNOSIS — M542 Cervicalgia: Secondary | ICD-10-CM

## 2020-01-23 DIAGNOSIS — G8929 Other chronic pain: Secondary | ICD-10-CM
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
## 2020-01-25 ENCOUNTER — Encounter: Payer: Federal, State, Local not specified - PPO | Admitting: Rehabilitative and Restorative Service Providers"

## 2020-01-30 ENCOUNTER — Encounter: Payer: Federal, State, Local not specified - PPO | Admitting: Physical Therapy

## 2020-02-01 ENCOUNTER — Encounter: Payer: Federal, State, Local not specified - PPO | Admitting: Rehabilitative and Restorative Service Providers"

## 2020-02-06 ENCOUNTER — Ambulatory Visit: Payer: Federal, State, Local not specified - PPO | Admitting: Rehabilitative and Restorative Service Providers"

## 2020-02-06 ENCOUNTER — Encounter: Payer: Self-pay | Admitting: Rehabilitative and Restorative Service Providers"

## 2020-02-06 ENCOUNTER — Other Ambulatory Visit: Payer: Self-pay

## 2020-02-06 DIAGNOSIS — M542 Cervicalgia: Secondary | ICD-10-CM | POA: Diagnosis not present

## 2020-02-06 DIAGNOSIS — R293 Abnormal posture: Secondary | ICD-10-CM

## 2020-02-06 DIAGNOSIS — M6281 Muscle weakness (generalized): Secondary | ICD-10-CM | POA: Diagnosis not present

## 2020-02-06 DIAGNOSIS — G8929 Other chronic pain: Secondary | ICD-10-CM

## 2020-02-06 DIAGNOSIS — M25511 Pain in right shoulder: Secondary | ICD-10-CM
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
## 2020-02-06 NOTE — Patient Instructions (Signed)
Access Code: [email or web address] URL: [email or web address] Date: 02/06/2020 Prepared by: Chyrel Masson  Exercises Standing Shoulder Row with Anchored Resistance - 2 x daily - 7 x weekly - 10 reps - 3 sets Shoulder Extension with Resistance - 2 x daily - 7 x weekly - 10 reps - 3 sets Standing Shoulder Posterior Capsule Stretch - 2 x daily - 7 x weekly - 1 sets - 5 reps - 15-30 hold Shoulder External Rotation with Anchored Resistance - 2 x daily - 7 x weekly - 3 sets - 10 reps Seated Assisted Cervical Rotation with Towel - 2 x daily - 7 x weekly - 1 sets - 10 reps - 2-3 hold Seated Upper Trapezius Stretch - 2 x daily - 7 x weekly - 1 sets - 5 reps - 15 hold Seated Levator Scapulae Stretch - 2 x daily - 7 x weekly - 1 sets - 5 reps - 15 hold

## 2020-02-08 ENCOUNTER — Encounter: Payer: Federal, State, Local not specified - PPO | Admitting: Rehabilitative and Restorative Service Providers"

## 2020-02-12 ENCOUNTER — Encounter: Payer: Federal, State, Local not specified - PPO | Admitting: Rehabilitative and Restorative Service Providers"

## 2020-02-14 ENCOUNTER — Encounter: Payer: Federal, State, Local not specified - PPO | Admitting: Rehabilitative and Restorative Service Providers"

## 2020-02-19 ENCOUNTER — Encounter: Payer: Federal, State, Local not specified - PPO | Admitting: Physical Therapy

## 2020-02-21 ENCOUNTER — Encounter: Payer: Federal, State, Local not specified - PPO | Admitting: Physical Therapy

## 2020-02-26 ENCOUNTER — Encounter: Payer: Federal, State, Local not specified - PPO | Admitting: Rehabilitative and Restorative Service Providers"

## 2020-02-28 ENCOUNTER — Encounter: Payer: Federal, State, Local not specified - PPO | Admitting: Rehabilitative and Restorative Service Providers"

## 2020-03-04 ENCOUNTER — Encounter: Payer: Federal, State, Local not specified - PPO | Admitting: Rehabilitative and Restorative Service Providers"

## 2020-03-06 ENCOUNTER — Encounter: Payer: Federal, State, Local not specified - PPO | Admitting: Rehabilitative and Restorative Service Providers"

## 2020-03-11 ENCOUNTER — Encounter: Payer: Federal, State, Local not specified - PPO | Admitting: Rehabilitative and Restorative Service Providers"

## 2020-03-13 ENCOUNTER — Encounter: Payer: Federal, State, Local not specified - PPO | Admitting: Rehabilitative and Restorative Service Providers"

## 2020-05-03 ENCOUNTER — Encounter: Payer: Self-pay | Admitting: Cardiovascular Disease

## 2020-05-03 ENCOUNTER — Other Ambulatory Visit: Payer: Self-pay

## 2020-05-03 ENCOUNTER — Ambulatory Visit: Payer: Federal, State, Local not specified - PPO | Admitting: Cardiovascular Disease

## 2020-05-03 VITALS — BP 138/72 | HR 52 | Ht 69.5 in | Wt 251.2 lb

## 2020-05-03 DIAGNOSIS — E782 Mixed hyperlipidemia: Secondary | ICD-10-CM

## 2020-05-03 DIAGNOSIS — I480 Paroxysmal atrial fibrillation: Secondary | ICD-10-CM

## 2020-05-03 DIAGNOSIS — G4733 Obstructive sleep apnea (adult) (pediatric): Secondary | ICD-10-CM

## 2020-05-03 DIAGNOSIS — I4819 Other persistent atrial fibrillation: Secondary | ICD-10-CM | POA: Diagnosis not present

## 2020-05-03 DIAGNOSIS — I10 Essential (primary) hypertension: Secondary | ICD-10-CM

## 2020-05-03 NOTE — Assessment & Plan Note (Signed)
History of obstructive sleep apnea on CPAP which he wears religiously.

## 2020-05-03 NOTE — Patient Instructions (Signed)

## 2020-05-03 NOTE — Progress Notes (Signed)
05/03/2020 [place]   14-Jul-1950  [id number]  Primary Physician Deland Pretty, MD Primary Cardiologist: Lorretta Harp MD [name], [place]  HPI:  [name] is a 70 y.o.  married Caucasian male father of 3 daughters referred by Dr.Pharrfor cardiovascular evaluation because of new onset A. Fib.I last saw him in the office  04/13/2019.I actually took care of [name] 4 to 5 years ago and prior to that back in 2007. He has a history of treated hypertension and untreated mild hyperlipidemia as well as family history of heart disease with maternal grandfather who had CABG in his 37s and a father who had a myocardial infarction in his 30s. He had negative stress test in the past both in 2007 and again in 2015. 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 Dr. Marcelle Smiling his thyroid functions. He is on thyroid replacement therapy. He is recently been demonstrated to be in atrial fibrillation which he thinks is been in for several months and was begun on Eliquis oral anticoagulation. He also has obstructive sleep apnea on CPAP. He does drink ice tea several times a day decaffeinated coffee. He does not drink alcohol.  He underwent successful outpatient DC cardioversion by Dr. Debara Pickett 01/17/2018 with marked 1 shock restoring sinus rhythm. He felt clinically improved after that. He has remained on Eliquis oral anticoagulation. He suspects he went into brief PAF yesterday after he had outpatient surgery and is back on his Eliquis. He did have a negative GXT on 02/04/2018 as well as a 2D echo that showed normal normal LV systolic function, mild LV cavity dilatation mild LVH with moderate left atrial enlargement.  Since I saw him in the office 1 year ago he continues to do well.  He has had no recurrent episodes of PAF maintaining sinus rhythm on Eliquis oral  anticoagulation.  He denies chest pain or shortness of breath..   Current Meds  Medication Sig   apixaban (ELIQUIS) 5 MG TABS tablet Take 5 mg by mouth 2 (two) times daily.   cholecalciferol (VITAMIN D3) 25 MCG (1000 UT) tablet Take 1,000 Units by mouth daily.   co-enzyme Q-10 30 MG capsule Take 30 mg by mouth 3 (three) times daily.   diltiazem (CARDIZEM CD) 120 MG 24 hr capsule TAKE 1 CAPSULE(120 MG) BY MOUTH DAILY   flecainide (TAMBOCOR) 100 MG tablet TAKE 1 TABLET(100 MG) BY MOUTH TWICE DAILY. PLEASE MAKE OVERDUE APPOINTMENT WITH DOCTOR ALLRED BEFORE ANYMORE REFILLS. THANK YOU FIRST ATTEMPT   levothyroxine (SYNTHROID, LEVOTHROID) 175 MCG tablet Take 175 mcg by mouth daily before breakfast.    Multiple Vitamins-Minerals (PRESERVISION AREDS 2+MULTI VIT PO) Take 1 capsule by mouth 2 (two) times daily.   Omega-3 1000 MG CAPS 1 capsule   OZEMPIC, 0.25 OR 0.5 MG/DOSE, 2 MG/1.5ML SOPN Inject into the skin.   rosuvastatin (CRESTOR) 10 MG tablet Take 10 mg by mouth daily.   sertraline (ZOLOFT) 100 MG tablet Take 50 mg by mouth daily.    telmisartan-hydrochlorothiazide (MICARDIS HCT) 80-25 MG tablet Take 1 tablet by mouth daily.   testosterone cypionate (DEPOTESTOSTERONE CYPIONATE) 200 MG/ML injection Inject 200 mg into the muscle every 14 (fourteen) days.    vitamin C (ASCORBIC ACID) 500 MG tablet Take 500 mg by mouth daily.     Allergies  Allergen Reactions   Penicillins Anaphylaxis    Pt reports  throat swelling, difficulty breathing at age 53 Has patient had a PCN reaction causing immediate rash, facial/tongue/throat swelling, SOB or lightheadedness with hypotension: yes Has patient had a PCN reaction causing severe rash involving mucus membranes or skin necrosis: no Has patient had a PCN reaction that required hospitalization no Has patient had a PCN reaction occurring within the last 10 years: no If all of the above answers are "NO", then may proceed with Cephalosporin use.     Latex Rash    Social History   Socioeconomic History   Marital status: Married    Spouse name: Not on file   Number of children: Not on file   Years of education: Not on file   Highest education level: Not on file  Occupational History   Not on file  Tobacco Use   Smoking status: Former Smoker    Types: Pipe   Smokeless tobacco: Never Used   Tobacco comment: quit 40 years ago  Vaping Use   Vaping Use: Never used  Substance and Sexual Activity   Alcohol use: Yes    Comment: rarely , glass of wine once a year   Drug use: No   Sexual activity: Yes  Other Topics Concern   Not on file  Social History Narrative   Lives in [place] with spouse.    Retired from the [employer].   Social Determinants of Health   Financial Resource Strain: Not on file  Food Insecurity: Not on file  Transportation Needs: Not on file  Physical Activity: Not on file  Stress: Not on file  Social Connections: Not on file  Intimate Partner Violence: Not on file     Review of Systems: General: negative for chills, fever, night sweats or weight changes.  Cardiovascular: negative for chest pain, dyspnea on exertion, edema, orthopnea, palpitations, paroxysmal nocturnal dyspnea or shortness of breath Dermatological: negative for rash Respiratory: negative for cough or wheezing Urologic: negative for hematuria Abdominal: negative for nausea, vomiting, diarrhea, bright red blood per rectum, melena, or hematemesis Neurologic: negative for visual changes, syncope, or dizziness All other systems reviewed and are otherwise negative except as noted above.    Blood pressure 138/72, pulse (!) 52, height 5' 9.5" (1.765 m), weight 251 lb 3.2 oz (113.9 kg).  General appearance: alert and no distress Neck: no adenopathy, no carotid bruit, no JVD, supple, symmetrical, trachea midline and thyroid not enlarged, symmetric, no tenderness/mass/nodules Lungs: clear to auscultation  bilaterally Heart: regular rate and rhythm, S1, S2 normal, no murmur, click, rub or gallop Extremities: extremities normal, atraumatic, no cyanosis or edema Pulses: 2+ and symmetric Skin: Skin color, texture, turgor normal. No rashes or lesions Neurologic: Alert and oriented X 3, normal strength and tone. Normal symmetric reflexes. Normal coordination and gait  EKG sinus bradycardia 52 without ST or T wave changes.  I personally reviewed this EKG.  ASSESSMENT AND PLAN:   HTN (hypertension) History of essential hypertension a blood pressure measured today at 138/72.  He is on diltiazem, Micardis and hydrochlorothiazide.  HLD (hyperlipidemia) History of hyperlipidemia on Crestor with lipid profile performed 12/04/2019 revealing an LDL of 60.  Obesity History of obesity with a BMI of 36.  He is trying to lose weight.  I told him that if he is not successfu on his own, I am happy to refer him to the diet and wellness center.  Obstructive sleep apnea History of obstructive sleep apnea on CPAP which he wears religiously.  Paroxysmal atrial fibrillation ([facility]) History  of paroxysmal atrial fibrillation maintaining sinus rhythm on Eliquis oral anticoagulation and flecainide.  He did have a negative GXT in 2019.      Lorretta Harp MD FACP,FACC,FAHA, [facility] 05/03/2020 10:22 AM

## 2020-05-03 NOTE — Assessment & Plan Note (Signed)
History of obesity with a BMI of 36.  He is trying to lose weight.  I told him that if he is not successfu on his own, I am happy to refer him to the diet and wellness center.

## 2020-05-03 NOTE — Assessment & Plan Note (Signed)
History of essential hypertension a blood pressure measured today at 138/72.  He is on diltiazem, Micardis and hydrochlorothiazide.

## 2020-05-03 NOTE — Assessment & Plan Note (Signed)
History of paroxysmal atrial fibrillation maintaining sinus rhythm on Eliquis oral anticoagulation and flecainide.  He did have a negative GXT in 2019.

## 2020-05-03 NOTE — Assessment & Plan Note (Signed)
History of hyperlipidemia on Crestor with lipid profile performed 12/04/2019 revealing an LDL of 60.

## 2020-06-21 ENCOUNTER — Other Ambulatory Visit (HOSPITAL_COMMUNITY): Payer: Self-pay | Admitting: Cardiovascular Disease

## 2020-08-15 ENCOUNTER — Telehealth: Payer: Self-pay

## 2020-08-15 NOTE — Telephone Encounter (Signed)
Called and left a VM for patient to CB to schedule an appointment with Dr. Ninfa Linden for right knee pain.

## 2020-08-26 ENCOUNTER — Telehealth: Payer: Self-pay | Admitting: Radiology

## 2020-08-26 ENCOUNTER — Encounter: Payer: Self-pay | Admitting: Orthopaedic Surgery

## 2020-08-26 ENCOUNTER — Ambulatory Visit: Payer: Self-pay

## 2020-08-26 ENCOUNTER — Ambulatory Visit: Payer: Federal, State, Local not specified - PPO | Admitting: Orthopaedic Surgery

## 2020-08-26 ENCOUNTER — Other Ambulatory Visit: Payer: Self-pay

## 2020-08-26 VITALS — Ht 70.1 in | Wt 248.0 lb

## 2020-08-26 DIAGNOSIS — G8929 Other chronic pain: Secondary | ICD-10-CM | POA: Diagnosis not present

## 2020-08-26 DIAGNOSIS — M25561 Pain in right knee: Secondary | ICD-10-CM | POA: Diagnosis not present

## 2020-08-26 NOTE — Progress Notes (Signed)
The patient is well-known to me.  We have seen him for osteoarthritis of his right knee before.  He actually has a left medial compartment partial knee replacement.  The only thing that is really helped his right knee in the past is hyaluronic acid.  It has been a very long period of time since he has had it for his right knee.  He has tried steroid injections before in the right knee and that has not helped the way hyaluronic acid is helped to decrease the pain from osteoarthritis of the right knee.  He is also worked on activity modification as well as quad strengthening exercises.  He wears knee braces for both his knees.  Steroid injections again have not helped at all in the past.  He does take anti-inflammatories on occasion.  At this point he would like to be considered for a hyaluronic acid injection for his right knee since its been well over a year and even more since his last hyaluronic acid injection for his right knee that really helped him for a long period of time.  He is an active 70 year old gentleman.  He had no other acute change in medical status.  Examination of his right knee shows slight varus malalignment.  There is a mild effusion.  He has medial and lateral joint line tenderness and patellofemoral rotation with full range of motion of the knee and it is ligamentously stable.    2 views of the right knee show moderate tricompartment arthritic changes with slight varus malalignment, medial and patellofemoral narrowing and osteophytes in all 3 compartments.  Since he has failed all forms of conservative treatment in the past for his right knee osteoarthritis and given the fact that steroids have not helped, I agree with him trying hyaluronic acid again for the right knee since that it lasted for so many years.  We will work on ordering this for his right knee.  All question concerns were answered and addressed.

## 2020-08-26 NOTE — Telephone Encounter (Signed)
Please obtain authorization for right knee gel injection-[place]. Thanks.

## 2020-08-27 NOTE — Telephone Encounter (Signed)
Noted  

## 2020-09-20 ENCOUNTER — Telehealth: Payer: Self-pay

## 2020-09-20 NOTE — Telephone Encounter (Signed)
VOB submitted for SynviscOne, right knee. Pending BV. 

## 2020-10-04 ENCOUNTER — Telehealth: Payer: Self-pay

## 2020-10-04 NOTE — Telephone Encounter (Signed)
Called and left a VM for patient to CB to schedule for gel injection with Dr. Ninfa Linden.  Approved for SYnviscOne, right knee. [place] Patient will be responsible for 15% OOP. No Co-pay No PA required

## 2020-10-22 ENCOUNTER — Ambulatory Visit: Payer: Federal, State, Local not specified - PPO | Admitting: Orthopaedic Surgery

## 2020-10-22 ENCOUNTER — Other Ambulatory Visit: Payer: Self-pay

## 2020-10-22 DIAGNOSIS — M1711 Unilateral primary osteoarthritis, right knee: Secondary | ICD-10-CM | POA: Diagnosis not present

## 2020-10-22 MED ORDER — LIDOCAINE HCL 1 % IJ SOLN
4.0000 mL | INTRAMUSCULAR | Status: AC | PRN
  Administered 2020-10-22: 4 mL

## 2020-10-22 MED ORDER — HYLAN G-F 20 48 MG/6ML IX SOSY
48.0000 mg | PREFILLED_SYRINGE | INTRA_ARTICULAR | Status: AC | PRN
  Administered 2020-10-22: 48 mg via INTRA_ARTICULAR

## 2020-10-22 NOTE — Progress Notes (Signed)
   Procedure Note  Patient: [name]             Date of Birth: 1950/11/06           MRN: [id number]             Visit Date: 10/22/2020 HPI: Mr. Stitts returns today for Synvisc 1 injections right knee.  He states overall his right knee is somewhat better.  He is failed conservative treatment which is included steroid injections, activity modification and quad strengthening. He has known osteoarthritis with x-ray showing tricompartmental arthritic changes and slight varus malalignment.  Osteophytes in all 3 compartments. He has no scheduled knee surgery in the next 6 months.  Physical exam: Right knee no abnormal warmth erythema or effusion good range of motion.  Procedures: Visit Diagnoses:  1. Primary osteoarthritis of right knee     Large Joint Inj: R knee on 10/22/2020 8:42 AM Indications: pain Details: 22 G 1.5 in needle, anterolateral approach  Arthrogram: No  Medications: 4 mL lidocaine 1 %; 48 mg Hylan 48 MG/6ML Outcome: tolerated well, no immediate complications Procedure, treatment alternatives, risks and benefits explained, specific risks discussed. Consent was given by the patient. Immediately prior to procedure a time out was called to verify the correct patient, procedure, equipment, support staff and site/side marked as required. Patient was prepped and draped in the usual sterile fashion.    Plan: He understands that he needs to wait at least 6 months between supplemental injections.  Questions encouraged and answered.  Follow-up with [place] as needed.

## 2020-10-31 ENCOUNTER — Other Ambulatory Visit: Payer: Self-pay | Admitting: Cardiovascular Disease

## 2021-03-12 ENCOUNTER — Telehealth: Payer: Self-pay | Admitting: Oncology

## 2021-03-12 NOTE — Telephone Encounter (Signed)
Scheduled appt per 2/1 referral. Pt is aware of appt date and time. Pt is aware to arrive 15 mins prior to appt time.

## 2021-04-08 ENCOUNTER — Inpatient Hospital Stay: Payer: Federal, State, Local not specified - PPO | Attending: Oncology | Admitting: Oncology

## 2021-04-08 ENCOUNTER — Other Ambulatory Visit: Payer: Self-pay

## 2021-04-08 VITALS — BP 140/88 | HR 65 | Temp 97.9°F | Resp 16 | Wt 245.7 lb

## 2021-04-08 DIAGNOSIS — D751 Secondary polycythemia: Secondary | ICD-10-CM | POA: Insufficient documentation

## 2021-04-08 DIAGNOSIS — Z79899 Other long term (current) drug therapy: Secondary | ICD-10-CM | POA: Insufficient documentation

## 2021-04-08 DIAGNOSIS — Z7901 Long term (current) use of anticoagulants: Secondary | ICD-10-CM | POA: Diagnosis not present

## 2021-04-08 DIAGNOSIS — T387X5A Adverse effect of androgens and anabolic congeners, initial encounter: Secondary | ICD-10-CM | POA: Diagnosis not present

## 2021-04-08 DIAGNOSIS — I4891 Unspecified atrial fibrillation: Secondary | ICD-10-CM | POA: Insufficient documentation

## 2021-04-08 NOTE — Progress Notes (Signed)
Hematology and Oncology Follow Up Visit  [name] [id number] 1950/10/18 71 y.o. 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  His work-up for myeloproliferative disorder was negative including a negative JAK2 mutation.  Current therapy: Active surveillance  Interim History: Mr. Bertoni returns today for repeat evaluation.  I saw him in evaluation back in 2018 for the same issue with hemoglobin at that time around 18.4 on testosterone replacement.  Since that time, he continues to be on testosterone injection on a weekly basis under the care of Dr. Shelia Media.  His most recent CBC in January 2023 showed a hemoglobin of 18.2.   Clinically, he reports no issues at this time.  He denies any chest pain, shortness of breath or difficulty breathing.  He denies any thrombosis or bleeding episodes.  He is currently on Eliquis for atrial fibrillation.  Medications: Updated on review. Current Outpatient Medications  Medication Sig Dispense Refill   apixaban (ELIQUIS) 5 MG TABS tablet Take 5 mg by mouth 2 (two) times daily.     cholecalciferol (VITAMIN D3) 25 MCG (1000 UT) tablet Take 1,000 Units by mouth daily.     co-enzyme Q-10 30 MG capsule Take 30 mg by mouth 3 (three) times daily.     diltiazem (CARDIZEM CD) 120 MG 24 hr capsule TAKE 1 CAPSULE(120 MG) BY MOUTH DAILY 90 capsule 3   flecainide (TAMBOCOR) 100 MG tablet Take 1 tablet (100 mg total) by mouth 2 (two) times daily. 180 tablet 3   levothyroxine (SYNTHROID, LEVOTHROID) 175 MCG tablet Take 175 mcg by mouth daily before breakfast.      Multiple Vitamins-Minerals (PRESERVISION AREDS 2+MULTI VIT PO) Take 1 capsule by mouth 2 (two) times daily.     Omega-3 1000 MG CAPS 1 capsule     OZEMPIC, 0.25 OR 0.5 MG/DOSE, 2 MG/1.5ML SOPN Inject into the skin.     rosuvastatin (CRESTOR) 10 MG tablet Take 10 mg by mouth  daily.     sertraline (ZOLOFT) 100 MG tablet Take 50 mg by mouth daily.      telmisartan-hydrochlorothiazide (MICARDIS HCT) 80-25 MG tablet Take 1 tablet by mouth daily.     testosterone cypionate (DEPOTESTOSTERONE CYPIONATE) 200 MG/ML injection Inject 200 mg into the muscle every 14 (fourteen) days.      vitamin C (ASCORBIC ACID) 500 MG tablet Take 500 mg by mouth daily.     No current facility-administered medications for this visit.     Allergies:  Allergies  Allergen Reactions   Penicillins Anaphylaxis    Pt reports throat swelling, difficulty breathing at age 33 Has patient had a PCN reaction causing immediate rash, facial/tongue/throat swelling, SOB or lightheadedness with hypotension: yes Has patient had a PCN reaction causing severe rash involving mucus membranes or skin necrosis: no Has patient had a PCN reaction that required hospitalization no Has patient had a PCN reaction occurring within the last 10 years: no If all of the above answers are "NO", then may proceed with Cephalosporin use.    Latex Rash    Past Medical History, Surgical history, Social history, and Family History were reviewed and updated.  Physical Exam: Blood pressure 140/88, pulse 65, temperature 97.9 F (36.6 C), temperature source Temporal, resp. rate 16, weight 245 lb 11.2 oz (111.4 kg), SpO2 94 %. ECOG: 0    General appearance: Comfortable appearing without any discomfort Head: Normocephalic without any trauma Oropharynx: Mucous membranes  are moist and pink without any thrush or ulcers. Eyes: Pupils are equal and round reactive to light. Lymph nodes: No cervical, supraclavicular, inguinal or axillary lymphadenopathy.   Heart:regular rate and rhythm.  S1 and S2 without leg edema. Lung: Clear without any rhonchi or wheezes.  No dullness to percussion. Abdomin: Soft, nontender, nondistended with good bowel sounds.  No hepatosplenomegaly. Musculoskeletal: No joint deformity or effusion.  Full  range of motion noted. Neurological: No deficits noted on motor, sensory and deep tendon reflex exam. Skin: No petechial rash or dryness.  Appeared moist.     Lab Results: Lab Results  Component Value Date   WBC 7.9 02/25/2016   HGB 17.3 (H) 02/25/2016   HCT 51.7 (H) 02/25/2016   MCV 90.2 02/25/2016   PLT 216 02/25/2016     Chemistry      Component Value Date/Time   NA 139 12/08/2018 1519   NA 138 01/12/2018 0844   NA 139 02/25/2016 1204   K 3.8 12/08/2018 1519   K 4.2 02/25/2016 1204   CL 105 12/08/2018 1519   CO2 23 12/08/2018 1519   CO2 30 (H) 02/25/2016 1204   BUN 17 12/08/2018 1519   BUN 13 01/12/2018 0844   BUN 16.9 02/25/2016 1204   CREATININE 1.15 12/08/2018 1519   CREATININE 1.1 02/25/2016 1204      Component Value Date/Time   CALCIUM 9.5 12/08/2018 1519   CALCIUM 9.7 02/25/2016 1204   ALKPHOS 74 02/25/2016 1204   AST 17 02/25/2016 1204   ALT 14 02/25/2016 1204   BILITOT 0.61 02/25/2016 1204      Impression and Plan:  71 year old man with  1.  Secondary polycythemia related to testosterone replacement.  Hematology work-up did not reveal a myeloproliferative disorder.    The natural course of these findings were discussed today with the patient and management choices were reviewed.  This is a clear side effect related to testosterone replacement with mild elevation in his hemoglobin.  Blood donation could be considered but has been denied due to being on Eliquis.  Therapeutic phlebotomy consideration were discussed at this time.  Risks and benefits were reiterated.  At this time, I recommended period of active surveillance and repeat his CBC in 4 months.  Consideration for therapeutic phlebotomy if his hematocrit is above 55.    2.  Thrombosis risk: Remains low at this time given the fact that he is on Eliquis.  3. Follow-up: In 4 months for repeat follow-up.  30  minutes were dedicated to this visit. The time was spent on reviewing laboratory data,  discussing treatment options, discussing differential diagnosis and answering questions regarding future plan.    Zola Button, MD 2/28/202310:53 AM

## 2021-04-17 ENCOUNTER — Telehealth: Payer: Self-pay | Admitting: Oncology

## 2021-04-17 NOTE — Telephone Encounter (Signed)
Scheduled per 03/01 los, patient has been called and notified. ?

## 2021-06-14 ENCOUNTER — Other Ambulatory Visit (HOSPITAL_COMMUNITY): Payer: Self-pay | Admitting: Cardiovascular Disease

## 2021-06-17 ENCOUNTER — Telehealth: Payer: Self-pay | Admitting: Oncology

## 2021-06-17 NOTE — Telephone Encounter (Signed)
Called patient regarding upcoming appointment, left a voicemail. 

## 2021-08-05 ENCOUNTER — Other Ambulatory Visit: Payer: Self-pay

## 2021-08-05 ENCOUNTER — Inpatient Hospital Stay: Payer: Federal, State, Local not specified - PPO | Attending: Oncology

## 2021-08-05 ENCOUNTER — Inpatient Hospital Stay: Payer: Federal, State, Local not specified - PPO

## 2021-08-05 ENCOUNTER — Inpatient Hospital Stay: Payer: Federal, State, Local not specified - PPO | Admitting: Oncology

## 2021-08-05 VITALS — BP 119/77 | HR 62 | Temp 97.9°F | Resp 17 | Ht 70.1 in | Wt 233.2 lb

## 2021-08-05 DIAGNOSIS — D751 Secondary polycythemia: Secondary | ICD-10-CM | POA: Diagnosis not present

## 2021-08-05 DIAGNOSIS — Z79899 Other long term (current) drug therapy: Secondary | ICD-10-CM | POA: Insufficient documentation

## 2021-08-05 DIAGNOSIS — Z7901 Long term (current) use of anticoagulants: Secondary | ICD-10-CM | POA: Diagnosis not present

## 2021-08-05 LAB — CBC WITH DIFFERENTIAL (CANCER CENTER ONLY)
Abs Immature Granulocytes: 0.02 10*3/uL (ref 0.00–0.07)
Basophils Absolute: 0.1 10*3/uL (ref 0.0–0.1)
Basophils Relative: 1 %
Eosinophils Absolute: 0.2 10*3/uL (ref 0.0–0.5)
Eosinophils Relative: 2 %
HCT: 51.5 % (ref 39.0–52.0)
Hemoglobin: 17.6 g/dL — ABNORMAL HIGH (ref 13.0–17.0)
Immature Granulocytes: 0 %
Lymphocytes Relative: 23 %
Lymphs Abs: 1.7 10*3/uL (ref 0.7–4.0)
MCH: 30.9 pg (ref 26.0–34.0)
MCHC: 34.2 g/dL (ref 30.0–36.0)
MCV: 90.4 fL (ref 80.0–100.0)
Monocytes Absolute: 0.8 10*3/uL (ref 0.1–1.0)
Monocytes Relative: 11 %
Neutro Abs: 4.6 10*3/uL (ref 1.7–7.7)
Neutrophils Relative %: 63 %
Platelet Count: 234 10*3/uL (ref 150–400)
RBC: 5.7 MIL/uL (ref 4.22–5.81)
RDW: 13.3 % (ref 11.5–15.5)
WBC Count: 7.2 10*3/uL (ref 4.0–10.5)
nRBC: 0 % (ref 0.0–0.2)

## 2021-10-21 ENCOUNTER — Other Ambulatory Visit: Payer: Self-pay | Admitting: Cardiovascular Disease

## 2022-01-22 ENCOUNTER — Other Ambulatory Visit: Payer: Self-pay | Admitting: Cardiovascular Disease

## 2022-03-19 ENCOUNTER — Encounter (HOSPITAL_COMMUNITY): Payer: Self-pay | Admitting: *Deleted

## 2022-04-22 ENCOUNTER — Other Ambulatory Visit: Payer: Self-pay | Admitting: Cardiovascular Disease

## 2022-04-23 ENCOUNTER — Other Ambulatory Visit: Payer: Self-pay | Admitting: Cardiovascular Disease
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
## 2022-05-01 ENCOUNTER — Encounter: Payer: Self-pay | Admitting: Adult Health

## 2022-05-01 ENCOUNTER — Ambulatory Visit: Payer: Federal, State, Local not specified - PPO | Attending: Adult Health | Admitting: Adult Health

## 2022-05-01 VITALS — BP 140/70 | HR 47 | Ht 69.0 in | Wt 253.8 lb

## 2022-05-01 DIAGNOSIS — I10 Essential (primary) hypertension: Secondary | ICD-10-CM

## 2022-05-01 DIAGNOSIS — E7800 Pure hypercholesterolemia, unspecified: Secondary | ICD-10-CM

## 2022-05-01 DIAGNOSIS — G4733 Obstructive sleep apnea (adult) (pediatric): Secondary | ICD-10-CM | POA: Diagnosis not present

## 2022-05-01 DIAGNOSIS — I480 Paroxysmal atrial fibrillation: Secondary | ICD-10-CM | POA: Diagnosis not present

## 2022-05-01 DIAGNOSIS — I2129 ST elevation (STEMI) myocardial infarction involving other sites: Secondary | ICD-10-CM

## 2022-05-01 NOTE — Patient Instructions (Signed)
Medication Instructions:  No Changes *If you need a refill on your cardiac medications before your next appointment, please call your pharmacy*   Lab Work: No Labs If you have labs (blood work) drawn today and your tests are completely normal, you will receive your results only by: MyChart Message (if you have MyChart) OR A paper copy in the mail If you have any lab test that is abnormal or we need to change your treatment, we will call you to review the results.   Testing/Procedures: No Testing   Follow-Up: At [facility], you and your health needs are our priority.  As part of our continuing mission to provide you with exceptional heart care, we have created designated Provider Care Teams.  These Care Teams include your primary Cardiologist (physician) and Advanced Practice Providers (APPs -  Physician Assistants and Nurse Practitioners) who all work together to provide you with the care you need, when you need it.  We recommend signing up for the patient portal called "MyChart".  Sign up information is provided on this After Visit Summary.  MyChart is used to connect with patients for Virtual Visits (Telemedicine).  Patients are able to view lab/test results, encounter notes, upcoming appointments, etc.  Non-urgent messages can be sent to your provider as well.   To learn more about what you can do with MyChart, go to [email or web address].    Your next appointment:   1 year(s)  Provider:   Jonathan Berry, MD     

## 2022-05-04 NOTE — Addendum Note (Signed)
Addended by: Merri Ray A on: 05/04/2022 04:19 PM   Modules accepted: Orders

## 2022-05-25 ENCOUNTER — Other Ambulatory Visit: Payer: Self-pay | Admitting: Cardiovascular Disease

## 2022-06-08 ENCOUNTER — Telehealth: Payer: Self-pay

## 2022-06-08 ENCOUNTER — Other Ambulatory Visit (INDEPENDENT_AMBULATORY_CARE_PROVIDER_SITE_OTHER): Payer: Federal, State, Local not specified - PPO

## 2022-06-08 ENCOUNTER — Telehealth: Payer: Self-pay | Admitting: Pharmacist Clinician (PhC)/ Clinical Pharmacy Specialist

## 2022-06-08 ENCOUNTER — Encounter: Payer: Self-pay | Admitting: Oncology

## 2022-06-08 ENCOUNTER — Ambulatory Visit: Payer: Federal, State, Local not specified - PPO | Admitting: Orthopaedic Surgery

## 2022-06-08 ENCOUNTER — Encounter: Payer: Self-pay | Admitting: Orthopaedic Surgery

## 2022-06-08 ENCOUNTER — Other Ambulatory Visit (HOSPITAL_COMMUNITY): Payer: Self-pay

## 2022-06-08 DIAGNOSIS — M25562 Pain in left knee: Secondary | ICD-10-CM

## 2022-06-08 DIAGNOSIS — M25561 Pain in right knee: Secondary | ICD-10-CM

## 2022-06-08 DIAGNOSIS — G8929 Other chronic pain: Secondary | ICD-10-CM

## 2022-06-08 DIAGNOSIS — M1711 Unilateral primary osteoarthritis, right knee: Secondary | ICD-10-CM

## 2022-06-08 DIAGNOSIS — M1712 Unilateral primary osteoarthritis, left knee: Secondary | ICD-10-CM

## 2022-06-08 NOTE — Telephone Encounter (Signed)
Gel injection, right knee.

## 2022-06-08 NOTE — Progress Notes (Signed)
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  He has a left medial compartment partial knee arthroplasty that was done many years ago.  He has been having pain in that left knee on the lateral aspect of his knee in the patellofemoral joint.  His right knee is also been painful.  Over 30 years ago he had a ligamentous reconstruction of the right knee with the medial collateral ligament that was injured.  He walks without assistive device.  He has had hyaluronic acid before in both knees.  On exam neither knee has an effusion but both knees have significant patellofemoral crepitation.  The left operative knee is straight and the incisions of healed nicely.  There is no effusion.  There is significant patellofemoral crepitation with the left knee as well as lateral pain and lateral joint line tenderness.  He has full range of motion and is ligamentously stable.  The right knee also has full range of motion and is ligamentously stable but there is medial and lateral joint line tenderness and patellofemoral tenderness and crepitation.  X-rays of the right knee show severe end-stage arthritis with osteophytes in all 3 compartments and bone-on-bone wear of the medial side of the knee.  There is retained staple from his medial collateral ligament reconstruction.  The left knee has a well-seated medial compartment knee replacement but there is profound arthritis now the patellofemoral joint and the lateral compartment the knee.  He would like to try hyaluronic acid for both knees and I think is certainly reasonable to try both knees even the knee with a partial knee replacement given the arthritis and the remaining parts of the left knee.  We will see if we get this ordered for him to treat the pain from arthritis in both knees.

## 2022-06-08 NOTE — Telephone Encounter (Signed)
Pharmacy Patient Advocate Encounter   Received notification from [facility] that prior authorization for [name] is needed.    PA submitted on 06/08/22 Key [id number] Status is pending  Haze Rushing, CPhT Pharmacy Patient Advocate Specialist Direct Number: [phone] Fax: [phone]

## 2022-06-08 NOTE — Telephone Encounter (Signed)
VOB submitted for Supartz, bilateral knee.

## 2022-06-08 NOTE — Telephone Encounter (Signed)
Patient reached out to office, wanted to talk about getting Ozempic/Wegovy after having discussed with Joni Reining NP at most recent visit.    Patient on [employer] FEP.  Per PA team, this plan does not cover weight loss medication.  He does not have ASCVD so cannot use the new indication for approval.    Will submit PA just to confirm.

## 2022-06-08 NOTE — Telephone Encounter (Signed)
Bilateral knee gel injections asap per Magnus Ivan

## 2022-06-09 ENCOUNTER — Telehealth: Payer: Self-pay

## 2022-06-09 NOTE — Telephone Encounter (Signed)
PA required for Supartz Fx, bilateral knee. PA started through [id number] portal.  Will call patient once approved through his insurance. PA pending

## 2022-06-09 NOTE — Telephone Encounter (Signed)
Pharmacy Patient Advocate Encounter  Received notification from [facility] that the request for prior authorization for [facility] has been denied due to NOT CONSIDERED MEDICAL NECESSITY PER PLAN GUIDELINESPLEASE ADVISE  Haze Rushing, CPhT Pharmacy Patient Advocate Specialist Direct Number: [phone] Fax: [phone]

## 2022-06-09 NOTE — Telephone Encounter (Signed)
Spoke with patient, explained that medication not currently covered on plan.  Patient has no ASCVD, therefore not indicated for that, and plan does not cover weight loss.   Patient voiced understanding.

## 2022-06-09 NOTE — Telephone Encounter (Signed)
Duplicate.  Submitted 06/08/2022.

## 2022-06-15 ENCOUNTER — Telehealth: Payer: Self-pay | Admitting: Orthopaedic Surgery

## 2022-06-15 NOTE — Telephone Encounter (Signed)
For [facility] Denied see separate encounter.

## 2022-06-15 NOTE — Telephone Encounter (Signed)
Called and left a VM advising patient that I am currently waiting on an approval from his insurance.  Once received, I will give patient a CB to schedule.

## 2022-06-15 NOTE — Telephone Encounter (Signed)
Pt called about an update of approval for gel injection. Looks like submitted 4/30. Please call pt when approved. Pt phone number is [phone].

## 2022-06-25 ENCOUNTER — Other Ambulatory Visit: Payer: Self-pay

## 2022-06-25 DIAGNOSIS — M1711 Unilateral primary osteoarthritis, right knee: Secondary | ICD-10-CM

## 2022-06-25 DIAGNOSIS — M1712 Unilateral primary osteoarthritis, left knee: Secondary | ICD-10-CM

## 2022-07-08 ENCOUNTER — Ambulatory Visit: Payer: Federal, State, Local not specified - PPO | Admitting: Orthopaedic Surgery

## 2022-07-08 DIAGNOSIS — M1712 Unilateral primary osteoarthritis, left knee: Secondary | ICD-10-CM | POA: Diagnosis not present

## 2022-07-08 DIAGNOSIS — M1711 Unilateral primary osteoarthritis, right knee: Secondary | ICD-10-CM | POA: Diagnosis not present

## 2022-07-08 MED ORDER — SODIUM HYALURONATE (VISCOSUP) 25 MG/2.5ML IX SOSY
25.0000 mg | PREFILLED_SYRINGE | INTRA_ARTICULAR | Status: AC | PRN
Start: 2022-07-08 — End: 2022-07-08
  Administered 2022-07-08: 25 mg via INTRA_ARTICULAR

## 2022-07-08 NOTE — Progress Notes (Signed)
   Procedure Note  Patient: [name]             Date of Birth: 1951-01-29           MRN: [id number]             Visit Date: 07/08/2022  Procedures: Visit Diagnoses:  1. Unilateral primary osteoarthritis, right knee   2. Unilateral primary osteoarthritis, left knee     Large Joint Inj: R knee on 07/08/2022 10:34 AM Indications: diagnostic evaluation and pain Details: 22 G 1.5 in needle, superolateral approach  Arthrogram: No  Medications: 25 mg Sodium Hyaluronate (Viscosup) 25 MG/2.5ML Outcome: tolerated well, no immediate complications Procedure, treatment alternatives, risks and benefits explained, specific risks discussed. Consent was given by the patient. Immediately prior to procedure a time out was called to verify the correct patient, procedure, equipment, support staff and site/side marked as required. Patient was prepped and draped in the usual sterile fashion.    Large Joint Inj: L knee on 07/08/2022 10:34 AM Indications: diagnostic evaluation and pain Details: 22 G 1.5 in needle, superolateral approach  Arthrogram: No  Medications: 25 mg Sodium Hyaluronate (Viscosup) 25 MG/2.5ML Outcome: tolerated well, no immediate complications Procedure, treatment alternatives, risks and benefits explained, specific risks discussed. Consent was given by the patient. Immediately prior to procedure a time out was called to verify the correct patient, procedure, equipment, support staff and site/side marked as required. Patient was prepped and draped in the usual sterile fashion.    The patient comes in today for series of 3 hyaluronic acid injections in both knees to treat the pain from osteoarthritis.  He does have a left partial knee arthroplasty of the medial compartment but there is significant arthritis in the remainder of his left knee.  Today's injections are with Supartz FX.  Neither knee has an effusion today.  I did place Supartz FX in both knees today without  difficulty.  This was injection #1 of a series of 3.  Will see him next week for injection #2 in both knees.  Lot number [id number]

## 2022-07-15 ENCOUNTER — Encounter: Payer: Self-pay | Admitting: Orthopaedic Surgery

## 2022-07-15 ENCOUNTER — Ambulatory Visit: Payer: Federal, State, Local not specified - PPO | Admitting: Orthopaedic Surgery

## 2022-07-15 DIAGNOSIS — M1711 Unilateral primary osteoarthritis, right knee: Secondary | ICD-10-CM

## 2022-07-15 DIAGNOSIS — M1712 Unilateral primary osteoarthritis, left knee: Secondary | ICD-10-CM | POA: Diagnosis not present

## 2022-07-15 MED ORDER — SODIUM HYALURONATE (VISCOSUP) 25 MG/2.5ML IX SOSY
25.0000 mg | PREFILLED_SYRINGE | INTRA_ARTICULAR | Status: AC | PRN
Start: 2022-07-15 — End: 2022-07-15
  Administered 2022-07-15: 25 mg via INTRA_ARTICULAR

## 2022-07-15 NOTE — Progress Notes (Signed)
   Procedure Note  Patient: [name]             Date of Birth: [birth year]           MRN: [id number]             Visit Date: 07/15/2022  Procedures: Visit Diagnoses:  1. Unilateral primary osteoarthritis, right knee   2. Unilateral primary osteoarthritis, left knee     Large Joint Inj: R knee on 07/15/2022 10:49 AM Indications: diagnostic evaluation and pain Details: 22 G 1.5 in needle, superolateral approach  Arthrogram: No  Medications: 25 mg Sodium Hyaluronate (Viscosup) 25 MG/2.5ML Outcome: tolerated well, no immediate complications Procedure, treatment alternatives, risks and benefits explained, specific risks discussed. Consent was given by the patient. Immediately prior to procedure a time out was called to verify the correct patient, procedure, equipment, support staff and site/side marked as required. Patient was prepped and draped in the usual sterile fashion.    Large Joint Inj: L knee on 07/15/2022 10:50 AM Indications: diagnostic evaluation and pain Details: 22 G 1.5 in needle, superolateral approach  Arthrogram: No  Medications: 25 mg Sodium Hyaluronate (Viscosup) 25 MG/2.5ML Outcome: tolerated well, no immediate complications Procedure, treatment alternatives, risks and benefits explained, specific risks discussed. Consent was given by the patient. Immediately prior to procedure a time out was called to verify the correct patient, procedure, equipment, support staff and site/side marked as required. Patient was prepped and draped in the usual sterile fashion.    The patient is here today for injection #2 of a series of 3 viscosupplementation/hyaluronic acid injections in both knees to treat the pain from osteoarthritis.  He has had no adverse reaction to the first injections.  These are with Supartz FX.  Neither knee has an effusion today.  I placed the Supartz FX in both knees today without difficulty.  We will see him back next week for injection #3 in both  knees.  Lot number [id number]

## 2022-07-22 ENCOUNTER — Encounter: Payer: Self-pay | Admitting: Orthopaedic Surgery

## 2022-07-22 ENCOUNTER — Ambulatory Visit (INDEPENDENT_AMBULATORY_CARE_PROVIDER_SITE_OTHER): Payer: Federal, State, Local not specified - PPO | Admitting: Orthopaedic Surgery

## 2022-07-22 DIAGNOSIS — M1711 Unilateral primary osteoarthritis, right knee: Secondary | ICD-10-CM | POA: Diagnosis not present

## 2022-07-22 DIAGNOSIS — M1712 Unilateral primary osteoarthritis, left knee: Secondary | ICD-10-CM

## 2022-07-22 MED ORDER — SODIUM HYALURONATE (VISCOSUP) 25 MG/2.5ML IX SOSY
25.0000 mg | PREFILLED_SYRINGE | INTRA_ARTICULAR | Status: AC | PRN
Start: 2022-07-22 — End: 2022-07-22
  Administered 2022-07-22: 25 mg via INTRA_ARTICULAR

## 2022-07-22 NOTE — Progress Notes (Signed)
   Procedure Note  Patient: [name]             Date of Birth: [birth year]           MRN: [id number]             Visit Date: 07/22/2022  Procedures: Visit Diagnoses:  1. Unilateral primary osteoarthritis, right knee   2. Unilateral primary osteoarthritis, left knee     Large Joint Inj: R knee on 07/22/2022 1:43 PM Indications: diagnostic evaluation and pain Details: 22 G 1.5 in needle, superolateral approach  Arthrogram: No  Medications: 25 mg Sodium Hyaluronate (Viscosup) 25 MG/2.5ML Outcome: tolerated well, no immediate complications Procedure, treatment alternatives, risks and benefits explained, specific risks discussed. Consent was given by the patient. Immediately prior to procedure a time out was called to verify the correct patient, procedure, equipment, support staff and site/side marked as required. Patient was prepped and draped in the usual sterile fashion.    Large Joint Inj: L knee on 07/22/2022 1:43 PM Indications: diagnostic evaluation and pain Details: 22 G 1.5 in needle, superolateral approach  Arthrogram: No  Medications: 25 mg Sodium Hyaluronate (Viscosup) 25 MG/2.5ML Outcome: tolerated well, no immediate complications Procedure, treatment alternatives, risks and benefits explained, specific risks discussed. Consent was given by the patient. Immediately prior to procedure a time out was called to verify the correct patient, procedure, equipment, support staff and site/side marked as required. Patient was prepped and draped in the usual sterile fashion.    The patient comes in today for his third injection in both knees of a series of 3 injections of hyaluronic acid with Supartz FX.  He has had no adverse reaction to the other 2 injections and is doing well overall.  He does have a partial knee replacement on one of his knees but significant arthritis in the remaining compartments of that knee.  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  I did  place injection #3 of Supartz FX in both knees which he tolerated well.  We can see him back in 3 months to see how he is doing overall.  No x-rays are needed at that visit.  Lot number [id number]

## 2022-07-30 ENCOUNTER — Other Ambulatory Visit (HOSPITAL_COMMUNITY): Payer: Self-pay | Admitting: Cardiovascular Disease

## 2022-08-06 ENCOUNTER — Telehealth: Payer: Self-pay | Admitting: *Deleted

## 2022-08-06 NOTE — Telephone Encounter (Signed)
   Pre-operative Risk Assessment    Patient Name: [name]  DOB: [birth year] MRN: [id number]      Request for Surgical Clearance    Procedure:   COLONOSCOPY  Date of Surgery:  Clearance 09/14/22                                 Surgeon:  DR. Ewing Schlein  Surgeon's Group or Practice Name:  [facility] Phone number:  [phone] Fax number:  [phone]   Type of Clearance Requested:   - Medical  - Pharmacy:  Hold Apixaban (Eliquis)     Type of Anesthesia:   PROPOFOL   Additional requests/questions:    Elpidio Anis   08/06/2022, 4:54 PM

## 2022-08-06 NOTE — Telephone Encounter (Signed)
Pt has been scheduled to see Edd Fabian, FNP 08/07/22 @ 8 am for pre op clearance. Pt will need labs to be done for pre op, BMP and CBC. Pt asked to have an in office appt since he needed to come in for labs. Pt thanked me for the help today. I will update all parties involved.

## 2022-08-06 NOTE — Telephone Encounter (Signed)
   Name: [name]  DOB: 09/14/1950  MRN: [id number]  Primary Cardiologist: None   Preoperative team, please contact this patient and set up a phone call appointment for further preoperative risk assessment. Please obtain consent and complete medication review. Thank you for your help.  Patient will need updated CBC and BMET prior to guidance being given for holding Eliquis.  -Patient's appointment can be pushed out until the end of July.  I confirm that guidance regarding antiplatelet and oral anticoagulation therapy has been completed and, if necessary, noted below.   Napoleon Form, Leodis Rains, NP 08/06/2022, 5:00 PM [facility]

## 2022-08-07 ENCOUNTER — Encounter: Payer: Self-pay | Admitting: General Practice

## 2022-08-07 ENCOUNTER — Ambulatory Visit: Payer: Federal, State, Local not specified - PPO | Attending: General Practice | Admitting: General Practice

## 2022-08-07 VITALS — BP 122/66 | HR 54 | Ht 69.5 in | Wt 263.8 lb

## 2022-08-07 DIAGNOSIS — I480 Paroxysmal atrial fibrillation: Secondary | ICD-10-CM | POA: Diagnosis not present

## 2022-08-07 DIAGNOSIS — E7800 Pure hypercholesterolemia, unspecified: Secondary | ICD-10-CM

## 2022-08-07 DIAGNOSIS — I10 Essential (primary) hypertension: Secondary | ICD-10-CM

## 2022-08-07 DIAGNOSIS — Z01810 Encounter for preprocedural cardiovascular examination: Secondary | ICD-10-CM

## 2022-08-07 NOTE — Patient Instructions (Addendum)
Medication Instructions:  The current medical regimen is effective;  continue present plan and medications as directed. Please refer to the Current Medication list given to you today.  *If you need a refill on your cardiac medications before your next appointment, please call your pharmacy*  Lab Work: CBC AND BMET TODAY If you have labs (blood work) drawn today and your tests are completely normal, you will receive your results only by:  MyChart Message (if you have MyChart) OR  A paper copy in the mail If you have any lab test that is abnormal or we need to change your treatment, we will call you to review the results.  Testing/Procedures: NONE  Follow-Up: At [facility], you and your health needs are our priority.  As part of our continuing mission to provide you with exceptional heart care, we have created designated Provider Care Teams.  These Care Teams include your primary Cardiologist (physician) and Advanced Practice Providers (APPs -  Physician Assistants and Nurse Practitioners) who all work together to provide you with the care you need, when you need it.  Your next appointment:   12 month(s)  Provider:   Nanetta Batty, MD     Other Instructions
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
## 2022-08-08 LAB — CBC
Hematocrit: 43.9 % (ref 37.5–51.0)
Hemoglobin: 14.4 g/dL (ref 13.0–17.7)
MCH: 29.9 pg (ref 26.6–33.0)
MCHC: 32.8 g/dL (ref 31.5–35.7)
MCV: 91 fL (ref 79–97)
Platelets: 237 10*3/uL (ref 150–450)
RBC: 4.81 x10E6/uL (ref 4.14–5.80)
RDW: 13.5 % (ref 11.6–15.4)
WBC: 6.5 10*3/uL (ref 3.4–10.8)

## 2022-08-08 LAB — BASIC METABOLIC PANEL
BUN/Creatinine Ratio: 15 (ref 10–24)
BUN: 15 mg/dL (ref 8–27)
CO2: 25 mmol/L (ref 20–29)
Calcium: 9.3 mg/dL (ref 8.6–10.2)
Chloride: 105 mmol/L (ref 96–106)
Creatinine, Ser: 0.98 mg/dL (ref 0.76–1.27)
Glucose: 98 mg/dL (ref 70–99)
Potassium: 4.4 mmol/L (ref 3.5–5.2)
Sodium: 144 mmol/L (ref 134–144)
eGFR: 82 mL/min/{1.73_m2} (ref >59)

## 2022-08-10 NOTE — Telephone Encounter (Signed)
   Patient Name: [name]  DOB: Apr 06, 1950 MRN: [id number]  Primary Cardiologist: Nanetta Batty, MD  Clinical pharmacists have reviewed the patient's past medical history, labs, and current medications as part of preoperative protocol coverage. The following recommendations have been made:  Per office protocol, patient can hold Eliquis for 2 days prior to procedure.   I will route this recommendation to the requesting party via Epic fax function and remove from pre-op pool.  Please call with questions.  Napoleon Form, Leodis Rains, NP 08/10/2022, 2:41 PM

## 2022-08-10 NOTE — Telephone Encounter (Signed)
Patient with diagnosis of afib on Eliquis for anticoagulation.    Procedure: colonoscopy Date of procedure: 09/14/22   CHA2DS2-VASc Score = 2   This indicates a 2.2% annual risk of stroke. The patient's score is based upon: CHF History: 0 HTN History: 1 Diabetes History: 0 Stroke History: 0 Vascular Disease History: 0 Age Score: 1 Gender Score: 0      CrCl 87 ml/min  Per office protocol, patient can hold Eliquis for 2 days prior to procedure.    **This guidance is not considered finalized until pre-operative APP has relayed final recommendations.**

## 2022-10-22 ENCOUNTER — Ambulatory Visit: Payer: Federal, State, Local not specified - PPO | Admitting: Orthopaedic Surgery

## 2022-10-22 DIAGNOSIS — G8929 Other chronic pain: Secondary | ICD-10-CM

## 2022-10-22 DIAGNOSIS — M25561 Pain in right knee: Secondary | ICD-10-CM

## 2022-10-22 DIAGNOSIS — M25562 Pain in left knee: Secondary | ICD-10-CM

## 2022-10-22 DIAGNOSIS — M1711 Unilateral primary osteoarthritis, right knee: Secondary | ICD-10-CM

## 2022-10-22 DIAGNOSIS — M1712 Unilateral primary osteoarthritis, left knee: Secondary | ICD-10-CM | POA: Diagnosis not present

## 2022-10-22 NOTE — Progress Notes (Signed)
The patient is a 72 year old active gentleman well-known to me.  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  He has had previous ligamentous reconstruction of the collateral ligaments on the medial aspect of his right knee.  X-rays earlier this year showed tricompartment arthritis with varus malalignment and bone-on-bone wear.  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  He has now developed significant arthritis of the patellofemoral joint and the lateral compartment of that left knee.  This was also confirmed on x-ray findings as well recently.  At this point he does wish to proceed with a right total knee arthroplasty.  We talked about knee replacement surgery in detail today.  We discussed the risk and benefits of the surgery and what to expect from an intraoperative and postoperative standpoint.  He is on blood thinning medications with his Eliquis and knows to come off of that 3 days prior to surgery.  I did discuss what the surgery involved in terms of removing the partial knee replacement and then proceeding with a total knee replacement.  We talked about with the recovery is and what to expect with this.  I was able to review all of his medications and past medical history within epic.  Both knees on exam have excellent range of motion and it helps that he is very mobile.  His BMI is 38 but his legs are not big.  He has well-healed surgical incision on the left knee.  Both knees are ligamentously stable but are painful throughout the arc of motion.  We will work on getting him set up for a left total knee arthroplasty with removal of the unicompartmental knee and transitioning to a full total knee.  We will work on getting this scheduled sometime for later in November per his wishes.

## 2022-11-03 ENCOUNTER — Telehealth: Payer: Self-pay

## 2022-11-03 NOTE — Telephone Encounter (Signed)
I called patient and left voice mail for return call to discuss scheduling surgery.

## 2022-12-05 ENCOUNTER — Other Ambulatory Visit: Payer: Self-pay | Admitting: Cardiovascular Disease

## 2022-12-07 ENCOUNTER — Other Ambulatory Visit: Payer: Self-pay

## 2022-12-07 MED ORDER — DILTIAZEM HCL ER COATED BEADS 120 MG PO CP24: ORAL_CAPSULE | ORAL | 2 refills | Status: DC

## 2022-12-17 ENCOUNTER — Encounter: Payer: Self-pay | Admitting: Oncology

## 2022-12-18 NOTE — Progress Notes (Signed)
Sent message, via epic in basket, requesting orders in epic from surgeon.  

## 2022-12-20 NOTE — Patient Instructions (Signed)
SURGICAL WAITING ROOM VISITATION Patients having surgery or a procedure may have no more than 2 support people in the waiting area - these visitors may rotate in the visitor waiting room.   Due to an increase in RSV and influenza rates and associated hospitalizations, children ages 31 and under may not visit patients in [facility] hospitals. If the patient needs to stay at the hospital during part of their recovery, the visitor guidelines for inpatient rooms apply.  PRE-OP VISITATION  Pre-op nurse will coordinate an appropriate time for 1 support person to accompany the patient in pre-op.  This support person may not rotate.  This visitor will be contacted when the time is appropriate for the visitor to come back in the pre-op area.  Please refer to the [facility] website for the visitor guidelines for Inpatients (after your surgery is over and you are in a regular room).  You are not required to quarantine at this time prior to your surgery. However, you must do this: Hand Hygiene often Do NOT share personal items Notify your provider if you are in close contact with someone who has COVID or you develop fever 100.4 or greater, new onset of sneezing, cough, sore throat, shortness of breath or body aches.  If you test positive for Covid or have been in contact with anyone that has tested positive in the last 10 days please notify you surgeon.    Your procedure is scheduled on:  Friday January 01, 2023  Report to [facility] Main Entrance: [place] entrance where the [employer] is available.   Report to admitting at: 11:45    AM  Call this number if you have any questions or problems the morning of surgery [phone]  Do not eat food after Midnight the night prior to your surgery/procedure.  After Midnight you may have the following liquids until    11:15 AM  DAY OF SURGERY  Clear Liquid Diet Water Black Coffee (sugar ok, NO MILK/CREAM OR CREAMERS)  Tea (sugar ok, NO  MILK/CREAM OR CREAMERS) regular and decaf                             Plain Jell-O  with no fruit (NO RED)                                           Fruit ices (not with fruit pulp, NO RED)                                     Popsicles (NO RED)                                                                  Juice: NO CITRUS JUICES: only apple, WHITE grape, WHITE cranberry Sports drinks like Gatorade or Powerade (NO RED)                    The day of surgery:  Drink ONE (1) Pre-Surgery Clear Ensure at   11:15  AM the morning of surgery. Drink in one sitting. Do not sip.  This drink was given to you during your hospital pre-op appointment visit. Nothing else to drink after completing the Pre-Surgery Clear Ensure : No candy, chewing gum or throat lozenges.    FOLLOW ANY ADDITIONAL PRE OP INSTRUCTIONS YOU RECEIVED FROM YOUR SURGEON'S OFFICE!!!   Oral Hygiene is also important to reduce your risk of infection.        Remember - BRUSH YOUR TEETH THE MORNING OF SURGERY WITH YOUR REGULAR TOOTHPASTE  Do NOT smoke after Midnight the night before surgery.  STOP TAKING all Vitamins, Herbs and supplements 1 week before your surgery.   Take ONLY these medicines the morning of surgery with A SIP OF WATER: Flecainide (Tambocor) sertraline (Zoloft), Diltiazem (Cardizem), levothyroxine (Synthroid).    If You have been diagnosed with Sleep Apnea - Bring CPAP mask and tubing day of surgery. We will provide you with a CPAP machine on the day of your surgery.                   You may not have any metal on your body including  jewelry, and body piercing  Do not wear , lotions, powders, cologne, or deodorant  Men may shave face and neck.  Contacts, Hearing Aids, dentures or bridgework may not be worn into surgery. DENTURES WILL BE REMOVED PRIOR TO SURGERY PLEASE DO NOT APPLY "Poly grip" OR ADHESIVES!!!  You may bring a small overnight bag with you on the day of surgery, only pack items that are  not valuable. [place] IS NOT RESPONSIBLE   FOR VALUABLES THAT ARE LOST OR STOLEN.   Do not bring your home medications to the hospital. The Pharmacy will dispense medications listed on your medication list to you during your admission in the Hospital.  Special Instructions: Bring a copy of your healthcare power of attorney and living will documents the day of surgery, if you wish to have them scanned into your [facility] Medical Records- EPIC  Please read over the following fact sheets you were given: IF YOU HAVE QUESTIONS ABOUT YOUR PRE-OP INSTRUCTIONS, PLEASE CALL [phone].     Pre-operative 5 CHG Bath Instructions   You can play a key role in reducing the risk of infection after surgery. Your skin needs to be as free of germs as possible. You can reduce the number of germs on your skin by washing with CHG (chlorhexidine gluconate) soap before surgery. CHG is an antiseptic soap that kills germs and continues to kill germs even after washing.   DO NOT use if you have an allergy to chlorhexidine/CHG or antibacterial soaps. If your skin becomes reddened or irritated, stop using the CHG and notify one of our RNs at [phone]  Please shower with the CHG soap starting 4 days before surgery using the following schedule: START SHOWERS ON MONDAY  December 28, 2022  Please keep in mind the following:  DO NOT shave, including legs and underarms, starting the day of your first shower.   You may shave your face at any point before/day of surgery.   Place clean sheets on your bed the day you start using CHG soap. Use a clean washcloth (not used since being washed) for each shower. DO NOT sleep with pets once you start using the CHG.   CHG Shower Instructions:  If you choose to wash your hair and private  area, wash first with your normal shampoo/soap.  After you use shampoo/soap, rinse your hair and body thoroughly to remove shampoo/soap residue.  Turn the water OFF and apply about 3 tablespoons (45 ml) of CHG soap to a CLEAN washcloth.  Apply CHG soap ONLY FROM YOUR NECK DOWN TO YOUR TOES (washing for 3-5 minutes)  DO NOT use CHG soap on face, private areas, open wounds, or sores.  Pay special attention to the area where your surgery is being performed.  If you are having back surgery, having someone wash your back for you may be helpful.  Wait 2 minutes after CHG soap is applied, then you may rinse off the CHG soap.  Pat dry with a clean towel  Put on clean clothes/pajamas   If you choose to wear lotion, please use ONLY the CHG-compatible lotions on the back of this paper.     Additional instructions for the day of surgery: DO NOT APPLY any lotions, deodorants, cologne, or perfumes.   Put on clean/comfortable clothes.  Brush your teeth.  Ask your nurse before applying any prescription medications to the skin.      CHG Compatible Lotions   Aveeno Moisturizing lotion  Cetaphil Moisturizing Cream  Cetaphil Moisturizing Lotion  Clairol Herbal Essence Moisturizing Lotion, Dry Skin  Clairol Herbal Essence Moisturizing Lotion, Extra Dry Skin  Clairol Herbal Essence Moisturizing Lotion, Normal Skin  Curel Age Defying Therapeutic Moisturizing Lotion with Alpha Hydroxy  Curel Extreme Care Body Lotion  Curel Soothing Hands Moisturizing Hand Lotion  Curel Therapeutic Moisturizing Cream, Fragrance-Free  Curel Therapeutic Moisturizing Lotion, Fragrance-Free  Curel Therapeutic Moisturizing Lotion, Original Formula  Eucerin Daily Replenishing Lotion  Eucerin Dry Skin Therapy Plus Alpha Hydroxy Crme  Eucerin Dry Skin Therapy Plus Alpha Hydroxy Lotion  Eucerin Original Crme  Eucerin Original Lotion  Eucerin Plus Crme Eucerin Plus Lotion  Eucerin TriLipid Replenishing Lotion  Keri  Anti-Bacterial Hand Lotion  Keri Deep Conditioning Original Lotion Dry Skin Formula Softly Scented  Keri Deep Conditioning Original Lotion, Fragrance Free Sensitive Skin Formula  Keri Lotion Fast Absorbing Fragrance Free Sensitive Skin Formula  Keri Lotion Fast Absorbing Softly Scented Dry Skin Formula  Keri Original Lotion  Keri Skin Renewal Lotion Keri Silky Smooth Lotion  Keri Silky Smooth Sensitive Skin Lotion  Nivea Body Creamy Conditioning Oil  Nivea Body Extra Enriched Lotion  Nivea Body Original Lotion  Nivea Body Sheer Moisturizing Lotion Nivea Crme  Nivea Skin Firming Lotion  NutraDerm 30 Skin Lotion  NutraDerm Skin Lotion  NutraDerm Therapeutic Skin Cream  NutraDerm Therapeutic Skin Lotion  ProShield Protective Hand Cream  Provon moisturizing lotion   FAILURE TO FOLLOW THESE INSTRUCTIONS MAY RESULT IN THE CANCELLATION OF YOUR SURGERY  PATIENT SIGNATURE_________________________________  NURSE SIGNATURE__________________________________  ________________________________________________________________________    [name]    An incentive spirometer is a tool that can help keep your lungs clear and active. This tool measures how well you are filling your lungs with each breath. Taking long deep breaths  may help reverse or decrease the chance of developing breathing (pulmonary) problems (especially infection) following: A long period of time when you are unable to move or be active. BEFORE THE PROCEDURE  If the spirometer includes an indicator to show your best effort, your nurse or respiratory therapist will set it to a desired goal. If possible, sit up straight or lean slightly forward. Try not to slouch. Hold the incentive spirometer in an upright position. INSTRUCTIONS FOR USE  Sit on the edge of your bed if possible, or sit up as far as you can in bed or on a chair. Hold the incentive spirometer in an upright position. Breathe out normally. Place  the mouthpiece in your mouth and seal your lips tightly around it. Breathe in slowly and as deeply as possible, raising the piston or the ball toward the top of the column. Hold your breath for 3-5 seconds or for as long as possible. Allow the piston or ball to fall to the bottom of the column. Remove the mouthpiece from your mouth and breathe out normally. Rest for a few seconds and repeat Steps 1 through 7 at least 10 times every 1-2 hours when you are awake. Take your time and take a few normal breaths between deep breaths. The spirometer may include an indicator to show your best effort. Use the indicator as a goal to work toward during each repetition. After each set of 10 deep breaths, practice coughing to be sure your lungs are clear. If you have an incision (the cut made at the time of surgery), support your incision when coughing by placing a pillow or rolled up towels firmly against it. Once you are able to get out of bed, walk around indoors and cough well. You may stop using the incentive spirometer when instructed by your caregiver.  RISKS AND COMPLICATIONS Take your time so you do not get dizzy or light-headed. If you are in pain, you may need to take or ask for pain medication before doing incentive spirometry. It is harder to take a deep breath if you are having pain. AFTER USE Rest and breathe slowly and easily. It can be helpful to keep track of a log of your progress. Your caregiver can provide you with a simple table to help with this. If you are using the spirometer at home, follow these instructions: SEEK MEDICAL CARE IF:  You are having difficultly using the spirometer. You have trouble using the spirometer as often as instructed. Your pain medication is not giving enough relief while using the spirometer. You develop fever of 100.5 F (38.1 C) or higher.                                                                                                    SEEK IMMEDIATE  MEDICAL CARE IF:  You cough up bloody sputum that had not been present before. You develop fever of 102 F (38.9 C) or greater. You develop worsening pain at or near the incision site. MAKE SURE YOU:  Understand these instructions. Will watch your condition. Will get help  right away if you are not doing well or get worse. Document Released: 06/08/2006 Document Revised: 04/20/2011 Document Reviewed: 08/09/2006 [facility] Patient Information 2014 [place], [place].      WHAT IS A BLOOD TRANSFUSION? Blood Transfusion Information  A transfusion is the replacement of blood or some of its parts. Blood is made up of multiple cells which provide different functions. Red blood cells carry oxygen and are used for blood loss replacement. White blood cells fight against infection. Platelets control bleeding. Plasma helps clot blood. Other blood products are available for specialized needs, such as hemophilia or other clotting disorders. BEFORE THE TRANSFUSION  Who gives blood for transfusions?  Healthy volunteers who are fully evaluated to make sure their blood is safe. This is blood bank blood. Transfusion therapy is the safest it has ever been in the practice of medicine. Before blood is taken from a donor, a complete history is taken to make sure that person has no history of diseases nor engages in risky social behavior (examples are intravenous drug use or sexual activity with multiple partners). The donor's travel history is screened to minimize risk of transmitting infections, such as malaria. The donated blood is tested for signs of infectious diseases, such as HIV and hepatitis. The blood is then tested to be sure it is compatible with you in order to minimize the chance of a transfusion reaction. If you or a relative donates blood, this is often done in anticipation of surgery and is not appropriate for emergency situations. It takes many days to process the donated blood. RISKS AND  COMPLICATIONS Although transfusion therapy is very safe and saves many lives, the main dangers of transfusion include:  Getting an infectious disease. Developing a transfusion reaction. This is an allergic reaction to something in the blood you were given. Every precaution is taken to prevent this. The decision to have a blood transfusion has been considered carefully by your caregiver before blood is given. Blood is not given unless the benefits outweigh the risks. AFTER THE TRANSFUSION Right after receiving a blood transfusion, you will usually feel much better and more energetic. This is especially true if your red blood cells have gotten low (anemic). The transfusion raises the level of the red blood cells which carry oxygen, and this usually causes an energy increase. The nurse administering the transfusion will monitor you carefully for complications. HOME CARE INSTRUCTIONS  No special instructions are needed after a transfusion. You may find your energy is better. Speak with your caregiver about any limitations on activity for underlying diseases you may have. SEEK MEDICAL CARE IF:  Your condition is not improving after your transfusion. You develop redness or irritation at the intravenous (IV) site. SEEK IMMEDIATE MEDICAL CARE IF:  Any of the following symptoms occur over the next 12 hours: Shaking chills. You have a temperature by mouth above 102 F (38.9 C), not controlled by medicine. Chest, back, or muscle pain. People around you feel you are not acting correctly or are confused. Shortness of breath or difficulty breathing. Dizziness and fainting. You get a rash or develop hives. You have a decrease in urine output. Your urine turns a dark color or changes to pink, red, or brown. Any of the following symptoms occur over the next 10 days: You have a temperature by mouth above 102 F (38.9 C), not controlled by medicine. Shortness of breath. Weakness after normal activity. The  white part of the eye turns yellow (jaundice). You have a decrease in the  amount of urine or are urinating less often. Your urine turns a dark color or changes to pink, red, or brown. Document Released: 01/24/2000 Document Revised: 04/20/2011 Document Reviewed: 09/12/2007 [facility] Patient Information 2014 [place], [place].  _______________________________________________________________________

## 2022-12-20 NOTE — Progress Notes (Signed)
COVID Vaccine received:  []  No [x]  Yes Date of any COVID positive Test in last 90 days:  PCP - Merri Brunette, MD   [phone]  Cardiologist - Nanetta Batty, MD ,  Edd Fabian, NP cardiac clearance in 08-07-22 note in EPIC ? For colonoscopy ?  3 days hold Eliquis per Renne Crigler?  Chest x-ray - 05-03-2015  2v  Epic EKG - 08-07-2022 Stress Test - 2019  Epic ECHO - 12-20-2017  Epic Cardiac Cath -   PCR screen: [x]  Ordered & Completed []   No Order but Needs PROFEND     []   N/A for this surgery  Surgery Plan:  []  Ambulatory   [x]  Outpatient in bed  []  Admit Anesthesia:    []  General  [x]  Spinal  []   Choice []   MAC  Pacemaker / ICD device [x]  No []  Yes   Spinal Cord Stimulator:[x]  No []  Yes       History of Sleep Apnea? []  No [x]  Yes   CPAP used?- []  No [x]  Yes    Does the patient monitor blood sugar?   [x]  N/A   []  No []  Yes  Patient has: [x]  NO Hx DM   []  Pre-DM   []  DM1  []   DM2  Blood Thinner / Instructions:  Eliquis  Hold x 3 days per Dr. Renne Crigler. ???? cardiology Aspirin Instructions:  none  ERAS Protocol Ordered: []  No  [x]  Yes PRE-SURGERY [x]  ENSURE  []  G2   Patient is to be NPO after: 11:15  Dental hx: []  Dentures:  []  N/A      []  Bridge or Partial:                   []  Loose or Damaged teeth:   Comments: Patient was given the 5 CHG shower / bath instructions for TKA /revision surgery along with 2 bottles of the CHG soap. Patient will start this on:   Monday  12-28-2022  All questions were asked and answered, Patient voiced understanding of this process.   Activity level: Patient is able / unable to climb a flight of stairs without difficulty; []  No CP  []  No SOB, but would have ___   Patient can / can not perform ADLs without assistance.   Anesthesia review: PAF / A.flutter (cardioversions x 3), HTN, OSA-CPAP  Patient denies shortness of breath, fever, cough and chest pain at PAT appointment.  Patient verbalized understanding and agreement to the Pre-Surgical Instructions  that were given to them at this PAT appointment. Patient was also educated of the need to review these PAT instructions again prior to his surgery.I reviewed the appropriate phone numbers to call if they have any and questions or concerns.

## 2022-12-22 ENCOUNTER — Encounter (HOSPITAL_COMMUNITY): Payer: Self-pay

## 2022-12-22 ENCOUNTER — Encounter: Payer: Self-pay | Admitting: Oncology

## 2022-12-22 ENCOUNTER — Other Ambulatory Visit: Payer: Self-pay

## 2022-12-22 ENCOUNTER — Encounter (HOSPITAL_COMMUNITY)
Admission: RE | Admit: 2022-12-22 | Discharge: 2022-12-22 | Disposition: A | Discharge status: Home / Self Care | Payer: Federal, State, Local not specified - PPO | Source: Ambulatory Visit | Attending: Orthopaedic Surgery | Admitting: Orthopaedic Surgery

## 2022-12-22 VITALS — BP 123/70 | HR 56 | Temp 98.9°F | Resp 97 | Ht 69.5 in | Wt 265.0 lb

## 2022-12-22 DIAGNOSIS — Z01818 Encounter for other preprocedural examination: Secondary | ICD-10-CM | POA: Diagnosis present

## 2022-12-22 DIAGNOSIS — M1712 Unilateral primary osteoarthritis, left knee: Secondary | ICD-10-CM | POA: Diagnosis not present

## 2022-12-22 HISTORY — DX: Anxiety disorder, unspecified: F41.9

## 2022-12-22 HISTORY — DX: Cardiac arrhythmia, unspecified: I49.9

## 2022-12-22 LAB — COMPREHENSIVE METABOLIC PANEL
ALT: 19 U/L (ref 0–44)
AST: 22 U/L (ref 15–41)
Albumin: 3.9 g/dL (ref 3.5–5.0)
Alkaline Phosphatase: 80 U/L (ref 38–126)
Anion gap: 8 (ref 5–15)
BUN: 14 mg/dL (ref 8–23)
CO2: 26 mmol/L (ref 22–32)
Calcium: 9.2 mg/dL (ref 8.9–10.3)
Chloride: 104 mmol/L (ref 98–111)
Creatinine, Ser: 0.94 mg/dL (ref 0.61–1.24)
GFR, Estimated: >60 mL/min (ref >60)
Glucose, Bld: 107 mg/dL — ABNORMAL HIGH (ref 70–99)
Potassium: 3.9 mmol/L (ref 3.5–5.1)
Sodium: 138 mmol/L (ref 135–145)
Total Bilirubin: 0.8 mg/dL (ref <1.2)
Total Protein: 6.9 g/dL (ref 6.5–8.1)

## 2022-12-22 LAB — CBC
HCT: 42.9 % (ref 39.0–52.0)
Hemoglobin: 13.9 g/dL (ref 13.0–17.0)
MCH: 30 pg (ref 26.0–34.0)
MCHC: 32.4 g/dL (ref 30.0–36.0)
MCV: 92.5 fL (ref 80.0–100.0)
Platelets: 225 10*3/uL (ref 150–400)
RBC: 4.64 MIL/uL (ref 4.22–5.81)
RDW: 13.8 % (ref 11.5–15.5)
WBC: 5.8 10*3/uL (ref 4.0–10.5)
nRBC: 0 % (ref 0.0–0.2)

## 2022-12-22 LAB — SURGICAL PCR SCREEN
MRSA, PCR: NEGATIVE
Staphylococcus aureus: NEGATIVE

## 2022-12-23 ENCOUNTER — Encounter (HOSPITAL_COMMUNITY): Payer: Self-pay
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
## 2022-12-23 NOTE — Anesthesia Preprocedure Evaluation (Addendum)
Anesthesia Evaluation  Patient identified by MRN, date of birth, ID band Patient awake    Reviewed: Allergy & Precautions, H&P , NPO status , Patient's Chart, lab work & pertinent test results  Airway Mallampati: III  TM Distance: <3 FB Neck ROM: Full    Dental no notable dental hx.    Pulmonary sleep apnea , former smoker   Pulmonary exam normal breath sounds clear to auscultation       Cardiovascular hypertension, Normal cardiovascular exam+ dysrhythmias Atrial Fibrillation  Rhythm:Regular Rate:Normal     Neuro/Psych negative neurological ROS  negative psych ROS   GI/Hepatic negative GI ROS, Neg liver ROS,,,  Endo/Other   Hyperthyroidism Class 3 obesity  Renal/GU negative Renal ROS  negative genitourinary   Musculoskeletal negative musculoskeletal ROS (+)    Abdominal   Peds negative pediatric ROS (+)  Hematology negative hematology ROS (+)   Anesthesia Other Findings   Reproductive/Obstetrics negative OB ROS                             Anesthesia Physical Anesthesia Plan  ASA: 3  Anesthesia Plan: Spinal   Post-op Pain Management: Regional block*   Induction: Intravenous  PONV Risk Score and Plan: 1 and Propofol infusion, Ondansetron and Treatment may vary due to age or medical condition  Airway Management Planned: Simple Face Mask  Additional Equipment:   Intra-op Plan:   Post-operative Plan:   Informed Consent: I have reviewed the patients History and Physical, chart, labs and discussed the procedure including the risks, benefits and alternatives for the proposed anesthesia with the patient or authorized representative who has indicated his/her understanding and acceptance.     Dental advisory given  Plan Discussed with: CRNA and Surgeon  Anesthesia Plan Comments: (See PAT note from 11/12 by Sherlie Ban PA-C )        Anesthesia Quick Evaluation
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
## 2023-01-01 ENCOUNTER — Encounter (HOSPITAL_COMMUNITY)
Admission: RE | Disposition: A | Discharge status: Home Health | Payer: Self-pay | Source: Home / Self Care | Attending: Orthopaedic Surgery

## 2023-01-01 ENCOUNTER — Ambulatory Visit (HOSPITAL_COMMUNITY): Payer: Medicare Other | Admitting: Physician Assistant

## 2023-01-01 ENCOUNTER — Ambulatory Visit (HOSPITAL_COMMUNITY): Payer: Medicare Other | Admitting: Anesthesiology

## 2023-01-01 ENCOUNTER — Other Ambulatory Visit: Payer: Self-pay

## 2023-01-01 ENCOUNTER — Inpatient Hospital Stay (HOSPITAL_COMMUNITY): Payer: Medicare Other

## 2023-01-01 ENCOUNTER — Inpatient Hospital Stay (HOSPITAL_COMMUNITY)
Admission: RE | Admit: 2023-01-01 | Discharge: 2023-01-02 | DRG: 467 | Disposition: A | Discharge status: Home Health | Payer: Medicare Other | Attending: Orthopaedic Surgery | Admitting: Orthopaedic Surgery

## 2023-01-01 ENCOUNTER — Encounter (HOSPITAL_COMMUNITY): Payer: Self-pay | Admitting: Orthopaedic Surgery

## 2023-01-01 DIAGNOSIS — E785 Hyperlipidemia, unspecified: Secondary | ICD-10-CM | POA: Diagnosis present

## 2023-01-01 DIAGNOSIS — M1712 Unilateral primary osteoarthritis, left knee: Principal | ICD-10-CM | POA: Diagnosis present

## 2023-01-01 DIAGNOSIS — I10 Essential (primary) hypertension: Secondary | ICD-10-CM | POA: Diagnosis present

## 2023-01-01 DIAGNOSIS — I4819 Other persistent atrial fibrillation: Secondary | ICD-10-CM | POA: Diagnosis present

## 2023-01-01 DIAGNOSIS — E66813 Obesity, class 3: Secondary | ICD-10-CM | POA: Diagnosis present

## 2023-01-01 DIAGNOSIS — Z79890 Hormone replacement therapy (postmenopausal): Secondary | ICD-10-CM

## 2023-01-01 DIAGNOSIS — T84093A Other mechanical complication of internal left knee prosthesis, initial encounter: Secondary | ICD-10-CM | POA: Diagnosis present

## 2023-01-01 DIAGNOSIS — Z6838 Body mass index (BMI) 38.0-38.9, adult: Secondary | ICD-10-CM | POA: Diagnosis not present

## 2023-01-01 DIAGNOSIS — Z823 Family history of stroke: Secondary | ICD-10-CM

## 2023-01-01 DIAGNOSIS — F419 Anxiety disorder, unspecified: Secondary | ICD-10-CM | POA: Diagnosis present

## 2023-01-01 DIAGNOSIS — E0590 Thyrotoxicosis, unspecified without thyrotoxic crisis or storm: Secondary | ICD-10-CM | POA: Diagnosis present

## 2023-01-01 DIAGNOSIS — Z8249 Family history of ischemic heart disease and other diseases of the circulatory system: Secondary | ICD-10-CM | POA: Diagnosis not present

## 2023-01-01 DIAGNOSIS — Y792 Prosthetic and other implants, materials and accessory orthopedic devices associated with adverse incidents: Secondary | ICD-10-CM | POA: Diagnosis present

## 2023-01-01 DIAGNOSIS — Z96652 Presence of left artificial knee joint: Secondary | ICD-10-CM

## 2023-01-01 DIAGNOSIS — Z7901 Long term (current) use of anticoagulants: Secondary | ICD-10-CM

## 2023-01-01 DIAGNOSIS — Z79899 Other long term (current) drug therapy: Secondary | ICD-10-CM

## 2023-01-01 DIAGNOSIS — G4733 Obstructive sleep apnea (adult) (pediatric): Secondary | ICD-10-CM | POA: Diagnosis present

## 2023-01-01 DIAGNOSIS — Z87891 Personal history of nicotine dependence: Secondary | ICD-10-CM

## 2023-01-01 DIAGNOSIS — Z880 Allergy status to penicillin: Secondary | ICD-10-CM | POA: Diagnosis not present

## 2023-01-01 HISTORY — PX: TOTAL KNEE REVISION: SHX996

## 2023-01-01 LAB — ABO/RH: ABO/RH(D): O POS

## 2023-01-01 LAB — TYPE AND SCREEN
ABO/RH(D): O POS
Antibody Screen: NEGATIVE

## 2023-01-01 SURGERY — TOTAL KNEE REVISION
Anesthesia: Spinal | Site: Knee | Laterality: Left

## 2023-01-01 MED ORDER — METOCLOPRAMIDE HCL 5 MG PO TABS: 5.0000 mg | ORAL_TABLET | Freq: Three times a day (TID) | ORAL | Status: DC | PRN

## 2023-01-01 MED ORDER — HYDROMORPHONE HCL 1 MG/ML IJ SOLN: 0.5000 mg | INTRAMUSCULAR | Status: DC | PRN

## 2023-01-01 MED ORDER — OXYCODONE HCL 5 MG PO TABS
5.0000 mg | ORAL_TABLET | ORAL | Status: DC | PRN
  Administered 2023-01-01 (×3): 5 mg via ORAL
  Administered 2023-01-02: 10 mg via ORAL
  Administered 2023-01-02: 5 mg via ORAL
  Filled 2023-01-01 (×2): qty 2
  Filled 2023-01-01 (×3): qty 1

## 2023-01-01 MED ORDER — DOCUSATE SODIUM 100 MG PO CAPS
100.0000 mg | ORAL_CAPSULE | Freq: Two times a day (BID) | ORAL | Status: DC
  Administered 2023-01-02: 100 mg via ORAL
  Filled 2023-01-01 (×2): qty 1

## 2023-01-01 MED ORDER — LACTATED RINGERS IV SOLN: INTRAVENOUS | Status: DC

## 2023-01-01 MED ORDER — OXYCODONE HCL 5 MG/5ML PO SOLN: 5.0000 mg | Freq: Once | ORAL | Status: DC | PRN

## 2023-01-01 MED ORDER — METHOCARBAMOL 500 MG PO TABS
500.0000 mg | ORAL_TABLET | Freq: Four times a day (QID) | ORAL | Status: DC | PRN
  Administered 2023-01-01 – 2023-01-02 (×3): 500 mg via ORAL
  Filled 2023-01-01 (×3): qty 1

## 2023-01-01 MED ORDER — ACETAMINOPHEN 325 MG PO TABS
325.0000 mg | ORAL_TABLET | Freq: Four times a day (QID) | ORAL | Status: DC | PRN
  Administered 2023-01-02 (×2): 650 mg via ORAL
  Filled 2023-01-01 (×2): qty 2

## 2023-01-01 MED ORDER — HYDROCHLOROTHIAZIDE 25 MG PO TABS
25.0000 mg | ORAL_TABLET | Freq: Every day | ORAL | Status: DC
  Administered 2023-01-02: 25 mg via ORAL
  Filled 2023-01-01: qty 1

## 2023-01-01 MED ORDER — BUPIVACAINE-EPINEPHRINE 0.25% -1:200000 IJ SOLN
INTRAMUSCULAR | Status: AC
  Filled 2023-01-01: qty 1

## 2023-01-01 MED ORDER — POVIDONE-IODINE 10 % EX SWAB: 2.0000 | Freq: Once | CUTANEOUS | Status: DC

## 2023-01-01 MED ORDER — ORAL CARE MOUTH RINSE: 15.0000 mL | Freq: Once | OROMUCOSAL | Status: AC

## 2023-01-01 MED ORDER — DIPHENHYDRAMINE HCL 12.5 MG/5ML PO ELIX: 12.5000 mg | ORAL_SOLUTION | ORAL | Status: DC | PRN

## 2023-01-01 MED ORDER — OXYCODONE HCL 5 MG PO TABS
10.0000 mg | ORAL_TABLET | ORAL | Status: DC | PRN
  Administered 2023-01-02: 15 mg via ORAL
  Filled 2023-01-01: qty 3

## 2023-01-01 MED ORDER — SODIUM CHLORIDE 0.9 % IR SOLN
Status: DC | PRN
  Administered 2023-01-01: 1000 mL

## 2023-01-01 MED ORDER — VITAMIN D 25 MCG (1000 UNIT) PO TABS: 1000.0000 [IU] | ORAL_TABLET | Freq: Every day | ORAL | Status: DC

## 2023-01-01 MED ORDER — HYDROMORPHONE HCL 1 MG/ML IJ SOLN: 0.2500 mg | INTRAMUSCULAR | Status: DC | PRN

## 2023-01-01 MED ORDER — BUPIVACAINE-EPINEPHRINE (PF) 0.25% -1:200000 IJ SOLN
INTRAMUSCULAR | Status: DC | PRN
  Administered 2023-01-01: 30 mL

## 2023-01-01 MED ORDER — DEXAMETHASONE SODIUM PHOSPHATE 4 MG/ML IJ SOLN
INTRAMUSCULAR | Status: DC | PRN
  Administered 2023-01-01: 8 mg via INTRAVENOUS

## 2023-01-01 MED ORDER — PHENYLEPHRINE HCL-NACL 20-0.9 MG/250ML-% IV SOLN
INTRAVENOUS | Status: DC | PRN
  Administered 2023-01-01: 25 ug/min via INTRAVENOUS

## 2023-01-01 MED ORDER — CHLORHEXIDINE GLUCONATE 0.12 % MT SOLN
15.0000 mL | Freq: Once | OROMUCOSAL | Status: AC
Start: 2023-01-01 — End: 2023-01-01
  Administered 2023-01-01: 15 mL via OROMUCOSAL

## 2023-01-01 MED ORDER — FLECAINIDE ACETATE 100 MG PO TABS
100.0000 mg | ORAL_TABLET | Freq: Two times a day (BID) | ORAL | Status: DC
  Administered 2023-01-01 – 2023-01-02 (×2): 100 mg via ORAL
  Filled 2023-01-01 (×2): qty 1

## 2023-01-01 MED ORDER — CEFAZOLIN IN SODIUM CHLORIDE 3-0.9 GM/100ML-% IV SOLN
3.0000 g | INTRAVENOUS | Status: AC
  Administered 2023-01-01: 2 g via INTRAVENOUS
  Filled 2023-01-01: qty 100

## 2023-01-01 MED ORDER — PHENOL 1.4 % MT LIQD: 1.0000 | OROMUCOSAL | Status: DC | PRN

## 2023-01-01 MED ORDER — ONDANSETRON HCL 4 MG/2ML IJ SOLN: 4.0000 mg | Freq: Once | INTRAMUSCULAR | Status: DC | PRN

## 2023-01-01 MED ORDER — METOCLOPRAMIDE HCL 5 MG/ML IJ SOLN: 5.0000 mg | Freq: Three times a day (TID) | INTRAMUSCULAR | Status: DC | PRN

## 2023-01-01 MED ORDER — LEVOTHYROXINE SODIUM 25 MCG PO TABS
175.0000 ug | ORAL_TABLET | Freq: Every day | ORAL | Status: DC
  Administered 2023-01-02: 175 ug via ORAL
  Filled 2023-01-01: qty 2
  Filled 2023-01-01 (×2): qty 1

## 2023-01-01 MED ORDER — OXYCODONE HCL 5 MG PO TABS: 5.0000 mg | ORAL_TABLET | Freq: Once | ORAL | Status: DC | PRN

## 2023-01-01 MED ORDER — ONDANSETRON HCL 4 MG PO TABS: 4.0000 mg | ORAL_TABLET | Freq: Four times a day (QID) | ORAL | Status: DC | PRN

## 2023-01-01 MED ORDER — APIXABAN 5 MG PO TABS
5.0000 mg | ORAL_TABLET | Freq: Two times a day (BID) | ORAL | Status: DC
  Administered 2023-01-02: 5 mg via ORAL
  Filled 2023-01-01: qty 1

## 2023-01-01 MED ORDER — PHENYLEPHRINE HCL (PRESSORS) 10 MG/ML IV SOLN
INTRAVENOUS | Status: AC
  Filled 2023-01-01: qty 1

## 2023-01-01 MED ORDER — PROPOFOL 500 MG/50ML IV EMUL
INTRAVENOUS | Status: DC | PRN
  Administered 2023-01-01: 75 ug/kg/min via INTRAVENOUS

## 2023-01-01 MED ORDER — ONDANSETRON HCL 4 MG/2ML IJ SOLN: 4.0000 mg | Freq: Four times a day (QID) | INTRAMUSCULAR | Status: DC | PRN

## 2023-01-01 MED ORDER — CEFAZOLIN SODIUM-DEXTROSE 2-4 GM/100ML-% IV SOLN
INTRAVENOUS | Status: AC
  Filled 2023-01-01: qty 100

## 2023-01-01 MED ORDER — BUPIVACAINE IN DEXTROSE 0.75-8.25 % IT SOLN
INTRATHECAL | Status: DC | PRN
  Administered 2023-01-01: 2 mL via INTRATHECAL

## 2023-01-01 MED ORDER — MIDAZOLAM HCL 2 MG/2ML IJ SOLN
INTRAMUSCULAR | Status: AC
  Administered 2023-01-01: 2 mg
  Filled 2023-01-01: qty 2

## 2023-01-01 MED ORDER — CEFAZOLIN SODIUM-DEXTROSE 1-4 GM/50ML-% IV SOLN
1.0000 g | Freq: Four times a day (QID) | INTRAVENOUS | Status: AC
  Administered 2023-01-01 – 2023-01-02 (×2): 1 g via INTRAVENOUS
  Filled 2023-01-01 (×2): qty 50

## 2023-01-01 MED ORDER — LACTATED RINGERS IV SOLN: INTRAVENOUS | Status: DC | PRN

## 2023-01-01 MED ORDER — IRBESARTAN 150 MG PO TABS
300.0000 mg | ORAL_TABLET | Freq: Every day | ORAL | Status: DC
  Administered 2023-01-02: 300 mg via ORAL
  Filled 2023-01-01: qty 2

## 2023-01-01 MED ORDER — PANTOPRAZOLE SODIUM 40 MG PO TBEC
40.0000 mg | DELAYED_RELEASE_TABLET | Freq: Every day | ORAL | Status: DC
  Administered 2023-01-02: 40 mg via ORAL
  Filled 2023-01-01: qty 1

## 2023-01-01 MED ORDER — FENTANYL CITRATE PF 50 MCG/ML IJ SOSY
100.0000 ug | PREFILLED_SYRINGE | INTRAMUSCULAR | Status: AC
  Administered 2023-01-01: 50 ug via INTRAVENOUS
  Filled 2023-01-01: qty 2

## 2023-01-01 MED ORDER — TRANEXAMIC ACID-NACL 1000-0.7 MG/100ML-% IV SOLN
1000.0000 mg | INTRAVENOUS | Status: AC
Start: 2023-01-01 — End: 2023-01-01
  Administered 2023-01-01: 1000 mg via INTRAVENOUS
  Filled 2023-01-01: qty 100

## 2023-01-01 MED ORDER — 0.9 % SODIUM CHLORIDE (POUR BTL) OPTIME
TOPICAL | Status: DC | PRN
  Administered 2023-01-01: 1000 mL

## 2023-01-01 MED ORDER — ONDANSETRON HCL 4 MG/2ML IJ SOLN
INTRAMUSCULAR | Status: DC | PRN
  Administered 2023-01-01: 4 mg via INTRAVENOUS

## 2023-01-01 MED ORDER — EPHEDRINE 5 MG/ML INJ
INTRAVENOUS | Status: AC
  Filled 2023-01-01: qty 5

## 2023-01-01 MED ORDER — DILTIAZEM HCL ER COATED BEADS 120 MG PO CP24
120.0000 mg | ORAL_CAPSULE | Freq: Every day | ORAL | Status: DC
  Administered 2023-01-02: 120 mg via ORAL
  Filled 2023-01-01: qty 1

## 2023-01-01 MED ORDER — TELMISARTAN-HCTZ 80-25 MG PO TABS: 1.0000 | ORAL_TABLET | Freq: Every day | ORAL | Status: DC

## 2023-01-01 MED ORDER — EPHEDRINE SULFATE (PRESSORS) 50 MG/ML IJ SOLN
INTRAMUSCULAR | Status: DC | PRN
  Administered 2023-01-01 (×3): 5 mg via INTRAVENOUS
  Administered 2023-01-01: 10 mg via INTRAVENOUS

## 2023-01-01 MED ORDER — MENTHOL 3 MG MT LOZG: 1.0000 | LOZENGE | OROMUCOSAL | Status: DC | PRN

## 2023-01-01 MED ORDER — ALUM & MAG HYDROXIDE-SIMETH 200-200-20 MG/5ML PO SUSP: 30.0000 mL | ORAL | Status: DC | PRN

## 2023-01-01 MED ORDER — METHOCARBAMOL 1000 MG/10ML IJ SOLN: 500.0000 mg | Freq: Four times a day (QID) | INTRAMUSCULAR | Status: DC | PRN

## 2023-01-01 MED ORDER — SODIUM CHLORIDE 0.9 % IV SOLN: INTRAVENOUS | Status: DC

## 2023-01-01 MED ORDER — SERTRALINE HCL 50 MG PO TABS
50.0000 mg | ORAL_TABLET | Freq: Every day | ORAL | Status: DC
  Administered 2023-01-02: 50 mg via ORAL
  Filled 2023-01-01: qty 1

## 2023-01-01 SURGICAL SUPPLY — 55 items
BAG COUNTER SPONGE SURGICOUNT (BAG) IMPLANT
BAG ZIPLOCK 12X15 (MISCELLANEOUS) IMPLANT
BENZOIN TINCTURE PRP APPL 2/3 (GAUZE/BANDAGES/DRESSINGS) IMPLANT
BLADE SAG 18X100X1.27 (BLADE) ×1 IMPLANT
BLADE SURG SZ10 CARB STEEL (BLADE) ×2 IMPLANT
BNDG ELASTIC 6INX 5YD STR LF (GAUZE/BANDAGES/DRESSINGS) ×1 IMPLANT
BOWL SMART MIX CTS (DISPOSABLE) IMPLANT
CEMENT BONE R 1X40 (Cement) IMPLANT
CLOTH BEACON ORANGE TIMEOUT ST (SAFETY) ×1 IMPLANT
COVER SURGICAL LIGHT HANDLE (MISCELLANEOUS) ×1 IMPLANT
CUFF TRNQT CYL 34X4.125X (TOURNIQUET CUFF) ×1 IMPLANT
DRAPE INCISE IOBAN 66X45 STRL (DRAPES) ×3 IMPLANT
DRAPE U-SHAPE 47X51 STRL (DRAPES) ×1 IMPLANT
DURAPREP 26ML APPLICATOR (WOUND CARE) ×1 IMPLANT
ELECT REM PT RETURN 15FT ADLT (MISCELLANEOUS) ×1 IMPLANT
EVACUATOR 1/8 PVC DRAIN (DRAIN) IMPLANT
FEMORAL KNEE COMP SZ 8 STND LT (Knees) ×1 IMPLANT
FEMORAL KNEE COMP SZ 8STD LT (Knees) IMPLANT
GAUZE PAD ABD 8X10 STRL (GAUZE/BANDAGES/DRESSINGS) ×1 IMPLANT
GAUZE SPONGE 4X4 12PLY STRL (GAUZE/BANDAGES/DRESSINGS) ×1 IMPLANT
GAUZE XEROFORM 1X8 LF (GAUZE/BANDAGES/DRESSINGS) IMPLANT
GAUZE XEROFORM 5X9 LF (GAUZE/BANDAGES/DRESSINGS) IMPLANT
GLOVE BIO SURGEON STRL SZ7.5 (GLOVE) ×1 IMPLANT
GLOVE BIOGEL PI IND STRL 8 (GLOVE) ×2 IMPLANT
GLOVE ECLIPSE 8.0 STRL XLNG CF (GLOVE) ×1 IMPLANT
GOWN STRL REUS W/ TWL XL LVL3 (GOWN DISPOSABLE) ×2 IMPLANT
HOLDER FOLEY CATH W/STRAP (MISCELLANEOUS) IMPLANT
IMMOBILIZER KNEE 20 (SOFTGOODS) ×1 IMPLANT
IMMOBILIZER KNEE 20 THIGH 36 (SOFTGOODS) ×1 IMPLANT
KIT TURNOVER KIT A (KITS) IMPLANT
LINER TIB PS VE EF/8-11 18 LT (Liner) IMPLANT
NDL SAFETY ECLIPSE 18X1.5 (NEEDLE) IMPLANT
PACK TOTAL KNEE CUSTOM (KITS) ×1 IMPLANT
PADDING CAST COTTON 6X4 STRL (CAST SUPPLIES) ×2 IMPLANT
PROTECTOR NERVE ULNAR (MISCELLANEOUS) ×1 IMPLANT
SET HNDPC FAN SPRY TIP SCT (DISPOSABLE) ×1 IMPLANT
SET PAD KNEE POSITIONER (MISCELLANEOUS) ×1 IMPLANT
SPIKE FLUID TRANSFER (MISCELLANEOUS) IMPLANT
STAPLER SKIN PROX WIDE 3.9 (STAPLE) IMPLANT
STEM POLY PAT PLY 35M KNEE (Knees) IMPLANT
STEM TIB ST PERS 14+30 (Stem) IMPLANT
STEM TIBIA 5 DEG SZ E L KNEE (Knees) IMPLANT
STRIP CLOSURE SKIN 1/2X4 (GAUZE/BANDAGES/DRESSINGS) IMPLANT
SUT MNCRL AB 4-0 PS2 18 (SUTURE) IMPLANT
SUT VIC AB 0 CT1 36 (SUTURE) ×1 IMPLANT
SUT VIC AB 1 CT1 36 (SUTURE) ×2 IMPLANT
SUT VIC AB 2-0 CT1 TAPERPNT 27 (SUTURE) ×2 IMPLANT
SWAB COLLECTION DEVICE MRSA (MISCELLANEOUS) IMPLANT
SWAB CULTURE ESWAB REG 1ML (MISCELLANEOUS) IMPLANT
SYR 3ML LL SCALE MARK (SYRINGE) IMPLANT
TIBIA STEM 5 DEG SZ E L KNEE (Knees) ×1 IMPLANT
TOWER CARTRIDGE SMART MIX (DISPOSABLE) IMPLANT
TRAY FOLEY MTR SLVR 16FR STAT (SET/KITS/TRAYS/PACK) ×1 IMPLANT
TUBE KAMVAC SUCTION (TUBING) IMPLANT
WATER STERILE IRR 1000ML POUR (IV SOLUTION) ×1 IMPLANT

## 2023-01-01 NOTE — Progress Notes (Signed)
   01/01/23 2322  BiPAP/CPAP/SIPAP  $ Non-Invasive Home Ventilator  Initial  BiPAP/CPAP/SIPAP Pt Type Adult  BiPAP/CPAP/SIPAP DREAMSTATIOND  Mask Type Full face mask (pts home mask and tubing)  Mask Size Medium  EPAP 10 cmH2O (per pt his home settings)  FiO2 (%) 21 %  Patient Home Equipment No  Auto Titrate No  CPAP/SIPAP surface wiped down Yes

## 2023-01-01 NOTE — Op Note (Signed)
Operative Note  Date of operation: 01/01/2023 Preoperative diagnosis: Left knee failed medial partial knee replacement Postoperative diagnosis: Same  Procedure: Revision of left partial knee arthroplasty to a total knee arthroplasty  Implants: Biomet/Zimmer persona cemented knee system Implant Name Type Inv. Item Serial No. Manufacturer Lot No. LRB No. Used Action  CEMENT BONE R 1X40 - [id number] Cement CEMENT BONE R 1X40  ZIMMER RECON(ORTH,TRAU,BIO,SG) [id number] Left 1 Implanted  CEMENT BONE R 1X40 - [id number] Cement CEMENT BONE R 1X40  ZIMMER RECON(ORTH,TRAU,BIO,SG) [id number] Left 1 Implanted  STEM POLY PAT PLY 62M KNEE - [id number] Knees STEM POLY PAT PLY 62M KNEE  ZIMMER RECON(ORTH,TRAU,BIO,SG) [id number] Left 1 Implanted  LINER TIB PS VE EF/8-11 18 LT - [id number] Liner LINER TIB PS VE EF/8-11 18 LT  ZIMMER RECON(ORTH,TRAU,BIO,SG) [id number] Left 1 Implanted  STEM TIB ST PERS 14+30 - [id number] Stem STEM TIB ST PERS 14+30  ZIMMER RECON(ORTH,TRAU,BIO,SG) [id number] Left 1 Implanted  FEMORAL KNEE COMP SZ 8 STND LT - [id number] Knees FEMORAL KNEE COMP SZ 8 STND LT  ZIMMER RECON(ORTH,TRAU,BIO,SG) [id number] Left 1 Implanted  TIBIA STEM 5 DEG SZ E L KNEE - [id number] Knees TIBIA STEM 5 DEG SZ E L KNEE  ZIMMER RECON(ORTH,TRAU,BIO,SG) [id number] Left 1 Implanted   Surgeon: Vanita Panda. Magnus Ivan, MD Assistant: Rexene Edison, PA-C  Anesthesia: #1 left lower extremity adductor canal block, #2 spinal, #3 local Tourniquet time: 66 minutes EBL: Less than 100 cc Antibiotics: IV Ancef Complications: None  Indications: The patient is a 72 year old gentleman well-known to me.  In 2018 he had a left medial compartment unicompartmental knee replacement secondary to mainly just medial compartment arthritis.  It is now been 6 years and he has developed arthritis of the patellofemoral joint and the lateral compartment of the knee.  This has become quite symptomatic and we have recommended revision of the  partial knee replacement with removing this components and proceeding with a total knee arthroplasty.  We had a long and thorough discussion about the risk of acute blood loss anemia, bone loss, fracture, infection, DVT, implant failure and wound healing issues.  We talked about the risk of nerve or vessel injury as well.  The goals are hopefully decrease pain, improved mobility and improved quality of life.  Procedure description: After informed consent was obtained and the appropriate left knee was marked, anesthesia obtained a left lower extremity adductor canal block in the holding room.  The patient was then brought to the operating room and set up on the operating table where spinal anesthesia was obtained.  He was then laid in the supine position on the operating table and a Foley catheter was placed.  A nonsterile tourniquet was then placed around his upper left thigh and his left thigh, knee, leg and ankle were prepped and draped with DuraPrep and sterile drapes including a sterile stockinette.  A timeout was called and he was identified as the correct patient the correct left knee.  An Esmarch was used to wrap out the leg and the tourniquet was inflated to 300 mm of pressure.  With the knee extended a direct midline incision was made over the patella and carried this proximally distally.  Dissection was carried down the knee joint and a medial parapatellar arthrotomy was made.  There was evidence of some metallosis and synovitis.  There was a mild effusion.  There was no evidence of infection.  With the knee in a flexed position we remove the polyethylene liner easily.  We removed osteophytes from all  3 compartments of the knee as well as remnants of the ACL and lateral meniscus.  We then remove the tibial component with some difficulty.  To see new well-seated but we are able to remove it without disrupting any bone of the medial tibial plateau.  We then used an extramedullary cutting guide for making  her proximal tibia cut keeping this cut in line with the medial cut with just a small medial cut to freshen up that cut but of course it took a larger portion of the lateral femoral condyle area.  We then used an intramedullary drill for our distal femoral cutting guide setting this alignment guide on the epicondylar axis.  We then put our distal femur cutting guide in place and this was for a left knee at 5 degrees externally rotated for 10 mm cut.  Once we did that we then remove the medial femoral partial knee replacement component.  We then made our distal femur cut.  We brought the knee back down to full extension and went up several extension blocks before we get a better extension.  We then back to the femur and put a femoral sizing guide based off the epicondylar axis.  Based off of this we chose a size 8 femur.  We put a 4-in-1 cutting block for a size 8 femur and made our anterior posterior cuts followed our chamfer cuts.  We then went back to the tibia and chose a size E tibial tray for coverage for a left knee over the tibial plateau so the rotation of the tibial tubercle and the femur.  We did our drill hole and keel punch off of this.  We then trialed our size E left tibia combined with our size 8 right CR standard femur.  We trialed up to an 18 mm medial congruent polyethylene insert and we are pleased with stability and range of motion of that insert.  We then made a patella cut and drilled 3 holes for a size 35 patella button and again put the knee through several cycles of motion with the trial implants in place.  We then removed all trial implants from the knee and irrigated knee with normal saline solution.  Marcaine with epinephrine was then placed around the arthrotomy.  With the knee in a flexed position we then had our cement mixed and cemented our Biomet/Zimmer tibial tray for a left knee with a small stem size E followed by cementing our size 8 left CR standard femur.  Once we removed  excess cement debris from the knee we placed our 18 mm medial congruent polyethylene insert.  We then cemented our size 35 patella button.  We held the knee then fully extended and compressed while the cemented hardened.  Once it hardened the tourniquet was let down and hemostasis was obtained with electrocautery.  We then closed the arthrotomy with interrupted #1 Vicryl suture followed by 0 Vicryl close deep tissue and 2-0 Vicryl close subcutaneous tissue.  The skin was closed with staples.  Well-padded sterile dressing was applied.  The patient was taken to the recovery in stable addition with all final counts being correct and no complications noted.  Rexene Edison, PA-C did assist during the entire case and his assistance was crucial and medically necessary for soft tissue management and retraction, helping guide implant placement and a layered closure of the wound.

## 2023-01-01 NOTE — Interval H&P Note (Signed)
History and Physical Interval Note: The patient understands that he is here today for a revision of a left unicompartmental knee replacement to a total knee replacement given the development of arthritis in the other compartments of his knee.  There has been no acute or interval changes in medical status.  The risks and benefits of surgery were discussed in detail and informed consent has been obtained.  The left operative knee has been marked.  01/01/2023 12:36 PM  [name]  has presented today for surgery, with the diagnosis of OSTEOARTHRITIS LEFT KNEE WITH RETAINED UNICOMPARTMENTAL KNEE REPLACEMENT.  The various methods of treatment have been discussed with the patient and family. After consideration of risks, benefits and other options for treatment, the patient has consented to  Procedure(s): REMOVAL OF LEFT UNICOMPARTMENTAL KNEE AND LEFT TOTAL KNEE ARTHROPLASTY (Left) as a surgical intervention.  The patient's history has been reviewed, patient examined, no change in status, stable for surgery.  I have reviewed the patient's chart and labs.  Questions were answered to the patient's satisfaction.     Kathryne Hitch

## 2023-01-01 NOTE — Anesthesia Postprocedure Evaluation (Signed)
Anesthesia Post Note  Patient: [name]  Procedure(s) Performed: REMOVAL OF LEFT UNICOMPARTMENTAL KNEE AND LEFT TOTAL KNEE ARTHROPLASTY (Left: Knee)     Patient location during evaluation: PACU Anesthesia Type: Spinal Level of consciousness: oriented and awake and alert Pain management: pain level controlled Vital Signs Assessment: post-procedure vital signs reviewed and stable Respiratory status: spontaneous breathing, respiratory function stable and patient connected to nasal cannula oxygen Cardiovascular status: blood pressure returned to baseline and stable Postop Assessment: no headache, no backache and no apparent nausea or vomiting Anesthetic complications: no  No notable events documented.  Last Vitals:  Vitals:   01/01/23 1330 01/01/23 1335  BP: 125/71 127/70  Pulse: (!) 45 (!) 45  Resp: 11 (!) 8  Temp:    SpO2: 96% 97%    Last Pain:  Vitals:   01/01/23 1335  TempSrc:   PainSc: Asleep                 Sayaka Hoeppner S

## 2023-01-01 NOTE — OR Nursing (Signed)
Components explanted from left knee by Magnus Ivan, MD

## 2023-01-01 NOTE — Anesthesia Procedure Notes (Signed)
Spinal  Patient location during procedure: OR Start time: 01/01/2023 2:29 PM Reason for block: surgical anesthesia Staffing Performed: resident/CRNA  Resident/CRNA: Deri Fuelling, CRNA Performed by: Deri Fuelling, CRNA Authorized by: Eilene Ghazi, MD   Preanesthetic Checklist Completed: patient identified, IV checked, site marked, risks and benefits discussed, surgical consent, monitors and equipment checked, pre-op evaluation and timeout performed Spinal Block Patient position: sitting Prep: DuraPrep Patient monitoring: heart rate, cardiac monitor, continuous pulse ox and blood pressure Approach: midline Location: L3-4 Injection technique: single-shot Needle Needle type: Pencan  Needle gauge: 24 G Needle length: 10 cm Assessment Sensory level: T10 Events: CSF return

## 2023-01-01 NOTE — Transfer of Care (Signed)
Immediate Anesthesia Transfer of Care Note  Patient: Antwan R Nulty  Procedure(s) Performed: REMOVAL OF LEFT UNICOMPARTMENTAL KNEE AND LEFT TOTAL KNEE ARTHROPLASTY (Left: Knee)  Patient Location: PACU  Anesthesia Type:MAC combined with regional for post-op pain  Level of Consciousness: awake and alert   Airway & Oxygen Therapy: Patient Spontanous Breathing and Patient connected to face mask oxygen  Post-op Assessment: Report given to RN and Post -op Vital signs reviewed and stable  Post vital signs: Reviewed and stable  Last Vitals:  Vitals Value Taken Time  BP 139/114 01/01/23 1608  Temp    Pulse 64 01/01/23 1611  Resp 14 01/01/23 1611  SpO2 98 % 01/01/23 1611  Vitals shown include unfiled device data.  Last Pain:  Vitals:   01/01/23 1335  TempSrc:   PainSc: Asleep      Patients Stated Pain Goal: 4 (01/01/23 1200)  Complications: No notable events documented.

## 2023-01-02 LAB — CBC
HCT: 38.7 % — ABNORMAL LOW (ref 39.0–52.0)
Hemoglobin: 12.6 g/dL — ABNORMAL LOW (ref 13.0–17.0)
MCH: 30.7 pg (ref 26.0–34.0)
MCHC: 32.6 g/dL (ref 30.0–36.0)
MCV: 94.2 fL (ref 80.0–100.0)
Platelets: 206 10*3/uL (ref 150–400)
RBC: 4.11 MIL/uL — ABNORMAL LOW (ref 4.22–5.81)
RDW: 14.1 % (ref 11.5–15.5)
WBC: 14 10*3/uL — ABNORMAL HIGH (ref 4.0–10.5)
nRBC: 0 % (ref 0.0–0.2)

## 2023-01-02 LAB — BASIC METABOLIC PANEL
Anion gap: 8 (ref 5–15)
BUN: 16 mg/dL (ref 8–23)
CO2: 26 mmol/L (ref 22–32)
Calcium: 8.9 mg/dL (ref 8.9–10.3)
Chloride: 104 mmol/L (ref 98–111)
Creatinine, Ser: 0.85 mg/dL (ref 0.61–1.24)
GFR, Estimated: >60 mL/min (ref >60)
Glucose, Bld: 140 mg/dL — ABNORMAL HIGH (ref 70–99)
Potassium: 4.1 mmol/L (ref 3.5–5.1)
Sodium: 138 mmol/L (ref 135–145)

## 2023-01-02 MED ORDER — OXYCODONE HCL 5 MG PO TABS: 5.0000 mg | ORAL_TABLET | Freq: Four times a day (QID) | ORAL | 0 refills | Status: DC | PRN

## 2023-01-02 MED ORDER — TIZANIDINE HCL 2 MG PO TABS: 2.0000 mg | ORAL_TABLET | Freq: Four times a day (QID) | ORAL | 0 refills | Status: DC | PRN

## 2023-01-02 NOTE — TOC Transition Note (Addendum)
Transition of Care [facility]) - CM/SW Discharge Note   Patient Details  Name: [name] MRN: [id number] Date of Birth: 15-Feb-1950  [facility]) CM/SW Contact:  Darleene Cleaver, LCSW Phone Number: 01/02/2023, 12:14 PM   Clinical Narrative:     Patient will be going home with home health through [facility]. TOC signing off please reconsult with any other TOC needs, home health agency has been notified of planned discharge.  Patient needed a rolling walker, and [employer] will deliver to room prior to patient discharging today.  Patient was prearranged at MD office, [facility] notified patient discharging today.   Final next level of care: Home w Home Health Services Barriers to Discharge: Barriers Resolved   Patient Goals and CMS Choice      Discharge Placement    Home with home health.                  Patient and family notified of of transfer: 01/02/23  Discharge Plan and Services Additional resources added to the After Visit Summary for                  DME Arranged: Walker rolling DME Agency: [employer] Date DME Agency Contacted: 01/02/23 Time DME Agency Contacted: 1214 Representative spoke with at DME Agency: [facility] Arranged: PT HH Agency: [facility]     Representative spoke with at [facility] Agency: Prearranged at MD office.  Social Determinants of Health (SDOH) Interventions SDOH Screenings   Food Insecurity: No Food Insecurity (01/01/2023)  Housing: Patient Declined (01/01/2023)  Transportation Needs: No Transportation Needs (01/01/2023)  Utilities: Not At Risk (01/01/2023)  Tobacco Use: Medium Risk (01/01/2023)     Readmission Risk Interventions     No data to display
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
## 2023-01-02 NOTE — Progress Notes (Signed)
Subjective: 1 Day Post-Op Procedure(s) (LRB): REMOVAL OF LEFT UNICOMPARTMENTAL KNEE AND LEFT TOTAL KNEE ARTHROPLASTY (Left) Patient reports pain as moderate.    Objective: Vital signs in last 24 hours: Temp:  [97.6 F (36.4 C)-98.6 F (37 C)] 98.2 F (36.8 C) (11/23 0931) Pulse Rate:  [45-86] 86 (11/23 0931) Resp:  [8-19] 18 (11/23 0931) BP: (125-159)/(66-92) 159/78 (11/23 0931) SpO2:  [94 %-99 %] 95 % (11/23 0931) FiO2 (%):  [21 %] 21 % (11/22 2322) Weight:  [120.2 kg] 120.2 kg (11/22 1747)  Intake/Output from previous day: 11/22 0701 - 11/23 0700 In: 2320.1 [P.O.:600; I.V.:1453.5; IV Piggyback:266.7] Out: 1050 [Urine:1000; Blood:50] Intake/Output this shift: No intake/output data recorded.  Recent Labs    01/02/23 0401  HGB 12.6*   Recent Labs    01/02/23 0401  WBC 14.0*  RBC 4.11*  HCT 38.7*  PLT 206   Recent Labs    01/02/23 0401  NA 138  K 4.1  CL 104  CO2 26  BUN 16  CREATININE 0.85  GLUCOSE 140*  CALCIUM 8.9   No results for input(s): "LABPT", "INR" in the last 72 hours.  Sensation intact distally Intact pulses distally Dorsiflexion/Plantar flexion intact Incision: dressing C/D/I Compartment soft   Assessment/Plan: 1 Day Post-Op Procedure(s) (LRB): REMOVAL OF LEFT UNICOMPARTMENTAL KNEE AND LEFT TOTAL KNEE ARTHROPLASTY (Left) Up with therapy Discharge home with home health      [name] 01/02/2023, 11:07 AM

## 2023-01-02 NOTE — Progress Notes (Signed)
Physical Therapy Treatment Patient Details Name: [name] MRN: [id number] DOB: Feb 22, 1950 Today's Date: 01/02/2023   History of Present Illness 72 yo male s/p Revision of left partial knee arthroplasty to a total knee arthroplasty   on 01/02/23. PMH: OSA, HTN, obesity    PT Comments  Pt progressing, reports feeling better  this afternoon. Reviewed Ice Man device, donning doffing KI, knee precautions and adjusted pt RW to correct ht. Mobility as below. Pt and wife feel ready to d/c home today. Questions related to PT  encouraged and answered    If plan is discharge home, recommend the following: A little help with walking and/or transfers;A little help with bathing/dressing/bathroom;Assistance with cooking/housework;Help with stairs or ramp for entrance;Assist for transportation   Can travel by private vehicle        Equipment Recommendations  Rolling walker (2 wheels)    Recommendations for Other Services       Precautions / Restrictions Precautions Precautions: Fall;Knee Required Braces or Orthoses: Knee Immobilizer - Left Restrictions Weight Bearing Restrictions: No Other Position/Activity Restrictions: WBAT     Mobility  Bed Mobility Overal bed mobility: Needs Assistance Bed Mobility: Supine to Sit, Sit to Supine     Supine to sit: Min assist Sit to supine: Supervision   General bed mobility comments: assist with LLE to coem to sit, pt able to use leg lifter to lift LLE on to bed    Transfers Overall transfer level: Needs assistance Equipment used: Rolling walker (2 wheels) Transfers: Sit to/from Stand Sit to Stand: Supervision           General transfer comment: cues for hand placement and LLE position    Ambulation/Gait Ambulation/Gait assistance: Supervision, Contact guard assist Gait Distance (Feet): 40 Feet Assistive device: Rolling walker (2 wheels) Gait Pattern/deviations: Step-to pattern       General Gait Details: cues for sequence  and RW position   Stairs Stairs: Yes Stairs assistance: Contact guard assist Stair Management: Step to pattern, Two rails, Forwards Number of Stairs: 5 (x2) General stair comments: verbal cues for sequence, pt steady with no LOB or knee buckling. wife able to return demo for second trial   Wheelchair Mobility     Tilt Bed    Modified Rankin (Stroke Patients Only)       Balance                                            Cognition Arousal: Alert Behavior During Therapy: WFL for tasks assessed/performed Overall Cognitive Status: Within Functional Limits for tasks assessed                                          Exercises Total Joint Exercises Ankle Circles/Pumps: AROM, Both, 10 reps Quad Sets: AROM, Both, 10 reps Heel Slides: AAROM, Left, 10 reps Straight Leg Raises: AAROM, Left (4 reps,pain limiting)    General Comments        Pertinent Vitals/Pain Pain Assessment Pain Assessment: 0-10 Pain Score: 5  Pain Location: L knee Pain Descriptors / Indicators: Aching, Sore Pain Intervention(s): Limited activity within patient's tolerance, Monitored during session, Premedicated before session, Repositioned, Ice applied    Home Living Family/patient expects to be discharged to:: Private residence Living Arrangements: Spouse/significant other Available Help at  Discharge: Family Type of Home: House Home Access: Stairs to enter Entrance Stairs-Rails: Right;Left;Can reach both Entrance Stairs-Number of Steps: 4-5 Alternate Level Stairs-Number of Steps: has chair lift Home Layout: Two level Home Equipment: None      Prior Function            PT Goals (current goals can now be found in the care plan section) Acute Rehab PT Goals PT Goal Formulation: With patient Time For Goal Achievement: 01/09/23 Potential to Achieve Goals: Good Progress towards PT goals: Progressing toward goals    Frequency    7X/week      PT  Plan      Co-evaluation              AM-PAC PT "6 Clicks" Mobility   Outcome Measure  Help needed turning from your back to your side while in a flat bed without using bedrails?: A Little Help needed moving from lying on your back to sitting on the side of a flat bed without using bedrails?: A Little Help needed moving to and from a bed to a chair (including a wheelchair)?: A Little Help needed standing up from a chair using your arms (e.g., wheelchair or bedside chair)?: A Little Help needed to walk in hospital room?: A Little Help needed climbing 3-5 steps with a railing? : A Little 6 Click Score: 18    End of Session Equipment Utilized During Treatment: Left knee immobilizer;Gait belt Activity Tolerance: Patient tolerated treatment well Patient left: in bed;with call bell/phone within reach;with bed alarm set;with family/visitor present   PT Visit Diagnosis: Other abnormalities of gait and mobility (R26.89);Difficulty in walking, not elsewhere classified (R26.2)     Time: 1340-1410 PT Time Calculation (min) (ACUTE ONLY): 30 min  Charges:    $Gait Training: 8-22 mins $Therapeutic Exercise: 8-22 mins PT General Charges $$ ACUTE PT VISIT: 1 Visit                     Loretto Belinsky, PT  Acute Rehab Dept [facility]) [phone]  01/02/2023    [facility] 01/02/2023, 2:19 PM

## 2023-01-02 NOTE — Evaluation (Signed)
Physical Therapy Evaluation Patient Details Name: [name] MRN: [id number] DOB: 05-15-1950 Today's Date: 01/02/2023  History of Present Illness  72 yo male s/p Revision of left partial knee arthroplasty to a total knee arthroplasty   on 01/02/23. PMH: OSA, HTN, obesity  Clinical Impression  Pt is s/p TKA resulting in the deficits listed below (see PT Problem List).   Pt will benefit from acute skilled PT to increase their independence and safety with mobility to allow discharge.          If plan is discharge home, recommend the following: A little help with walking and/or transfers;A little help with bathing/dressing/bathroom;Assistance with cooking/housework;Help with stairs or ramp for entrance;Assist for transportation   Can travel by private vehicle        Equipment Recommendations Rolling walker (2 wheels)  Recommendations for Other Services       Functional Status Assessment Patient has had a recent decline in their functional status and demonstrates the ability to make significant improvements in function in a reasonable and predictable amount of time.     Precautions / Restrictions Precautions Precautions: Fall;Knee Restrictions Weight Bearing Restrictions: No Other Position/Activity Restrictions: WBAT      Mobility  Bed Mobility Overal bed mobility: Needs Assistance Bed Mobility: Supine to Sit     Supine to sit: Min assist     General bed mobility comments: assist with LLE    Transfers Overall transfer level: Needs assistance Equipment used: Rolling walker (2 wheels) Transfers: Sit to/from Stand Sit to Stand: Min assist, Contact guard assist           General transfer comment: cues for hand placement and LLE position    Ambulation/Gait Ambulation/Gait assistance: Contact guard assist Gait Distance (Feet): 50 Feet Assistive device: Rolling walker (2 wheels) Gait Pattern/deviations: Step-to pattern       General Gait Details: cues  for sequence and RW position  Stairs            Wheelchair Mobility     Tilt Bed    Modified Rankin (Stroke Patients Only)       Balance                                             Pertinent Vitals/Pain Pain Assessment Pain Assessment: 0-10 Pain Score: 4  Pain Location: L knee Pain Descriptors / Indicators: Aching, Sore Pain Intervention(s): Limited activity within patient's tolerance, Monitored during session, Premedicated before session, Repositioned, Ice applied    Home Living Family/patient expects to be discharged to:: Private residence Living Arrangements: Spouse/significant other Available Help at Discharge: Family Type of Home: House Home Access: Stairs to enter Entrance Stairs-Rails: Right;Left;Can reach both Secretary/administrator of Steps: 4-5 Alternate Level Stairs-Number of Steps: has chair lift Home Layout: Two level Home Equipment: None      Prior Function Prior Level of Function : Independent/Modified Independent                     Extremity/Trunk Assessment   Upper Extremity Assessment Upper Extremity Assessment: Overall WFL for tasks assessed    Lower Extremity Assessment Lower Extremity Assessment: LLE deficits/detail LLE Deficits / Details: ankle WFL. knee extension and hip flexion 2+/5       Communication   Communication Communication: Hearing impairment  Cognition Arousal: Alert Behavior During Therapy: WFL for tasks assessed/performed Overall Cognitive Status:  Within Functional Limits for tasks assessed                                          General Comments      Exercises Total Joint Exercises Ankle Circles/Pumps: AROM, Both, 10 reps   Assessment/Plan    PT Assessment Patient needs continued PT services  PT Problem List Decreased strength;Decreased range of motion;Decreased activity tolerance;Decreased mobility;Decreased knowledge of precautions;Decreased  balance;Pain       PT Treatment Interventions Gait training;DME instruction;Functional mobility training;Therapeutic activities;Therapeutic exercise;Patient/family education    PT Goals (Current goals can be found in the Care Plan section)  Acute Rehab PT Goals PT Goal Formulation: With patient Time For Goal Achievement: 01/09/23 Potential to Achieve Goals: Good    Frequency 7X/week     Co-evaluation               AM-PAC PT "6 Clicks" Mobility  Outcome Measure Help needed turning from your back to your side while in a flat bed without using bedrails?: A Little Help needed moving from lying on your back to sitting on the side of a flat bed without using bedrails?: A Little Help needed moving to and from a bed to a chair (including a wheelchair)?: A Little Help needed standing up from a chair using your arms (e.g., wheelchair or bedside chair)?: A Little Help needed to walk in hospital room?: A Little Help needed climbing 3-5 steps with a railing? : A Little 6 Click Score: 18    End of Session Equipment Utilized During Treatment: Gait belt Activity Tolerance: Patient tolerated treatment well Patient left: with call bell/phone within reach;in chair;with family/visitor present;with chair alarm set   PT Visit Diagnosis: Other abnormalities of gait and mobility (R26.89);Difficulty in walking, not elsewhere classified (R26.2)    Time: 1610-9604 PT Time Calculation (min) (ACUTE ONLY): 19 min   Charges:   PT Evaluation $PT Eval Low Complexity: 1 Low   PT General Charges $$ ACUTE PT VISIT: 1 Visit         Lamon Rotundo, PT  Acute Rehab Dept [facility]) [phone]  01/02/2023   [facility] 01/02/2023, 1:02 PM

## 2023-01-02 NOTE — Discharge Instructions (Signed)

## 2023-01-04 ENCOUNTER — Encounter (HOSPITAL_COMMUNITY): Payer: Self-pay | Admitting: Orthopaedic Surgery

## 2023-01-04 ENCOUNTER — Encounter: Payer: Self-pay | Admitting: Oncology

## 2023-01-11 ENCOUNTER — Other Ambulatory Visit: Payer: Self-pay | Admitting: Orthopaedic Surgery

## 2023-01-11 ENCOUNTER — Telehealth: Payer: Self-pay | Admitting: Orthopaedic Surgery

## 2023-01-11 MED ORDER — OXYCODONE HCL 5 MG PO TABS: 5.0000 mg | ORAL_TABLET | Freq: Four times a day (QID) | ORAL | 0 refills | Status: DC | PRN

## 2023-01-11 NOTE — Telephone Encounter (Signed)
Pt requesting oxycodone sent to [facility] please advise

## 2023-01-14 ENCOUNTER — Ambulatory Visit: Payer: Federal, State, Local not specified - PPO | Admitting: Orthopaedic Surgery

## 2023-01-14 ENCOUNTER — Encounter: Payer: Self-pay | Admitting: Orthopaedic Surgery

## 2023-01-14 DIAGNOSIS — Z96652 Presence of left artificial knee joint: Secondary | ICD-10-CM

## 2023-01-14 NOTE — Progress Notes (Signed)
The patient is here today for his first postoperative visit status post conversion of a partial knee replacement to a total knee replacement.  He had his partial knee of the medial compartment performed back in 2018.  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  2 weeks ago we took him to the operating room and remove the partial knee replacement and then performed a left total knee arthroplasty.  He is doing well overall.  He denies any calf pain.  He is on a chronic blood thinner already which is Eliquis.  We did refill his pain medication recently.  He is ready for outpatient therapy.  On exam his calf is soft on the left side.  His extension lacks full extension by about 3 degrees and I can flex him to just past 90 degrees.  The knee feels stable on exam.  The staples have been removed and Steri-Strips applied from the incision.  We will transition him to outpatient physical therapy.  When he runs low medication he knows to let us know.  The next time we see him is in 4 weeks to see how he is doing from a range of motion standpoint but no x-rays are needed.

## 2023-01-18 ENCOUNTER — Other Ambulatory Visit: Payer: Self-pay

## 2023-01-18 DIAGNOSIS — Z96652 Presence of left artificial knee joint: Secondary | ICD-10-CM
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
## 2023-01-21 ENCOUNTER — Encounter: Payer: Self-pay | Admitting: Rehabilitative and Restorative Service Providers"

## 2023-01-21 ENCOUNTER — Ambulatory Visit: Payer: Federal, State, Local not specified - PPO | Admitting: Rehabilitative and Restorative Service Providers"

## 2023-01-21 DIAGNOSIS — R600 Localized edema: Secondary | ICD-10-CM

## 2023-01-21 DIAGNOSIS — G8929 Other chronic pain: Secondary | ICD-10-CM

## 2023-01-21 DIAGNOSIS — R262 Difficulty in walking, not elsewhere classified: Secondary | ICD-10-CM | POA: Diagnosis not present

## 2023-01-21 DIAGNOSIS — M6281 Muscle weakness (generalized): Secondary | ICD-10-CM | POA: Diagnosis not present

## 2023-01-21 DIAGNOSIS — M25562 Pain in left knee: Secondary | ICD-10-CM | POA: Diagnosis not present

## 2023-01-21 DIAGNOSIS — M25662 Stiffness of left knee, not elsewhere classified: Secondary | ICD-10-CM

## 2023-02-08 ENCOUNTER — Ambulatory Visit: Payer: Federal, State, Local not specified - PPO | Admitting: Rehabilitative and Restorative Service Providers"

## 2023-02-08 ENCOUNTER — Encounter: Payer: Self-pay | Admitting: Physical Therapy

## 2023-02-08 DIAGNOSIS — M25562 Pain in left knee: Secondary | ICD-10-CM | POA: Diagnosis not present

## 2023-02-08 DIAGNOSIS — R262 Difficulty in walking, not elsewhere classified: Secondary | ICD-10-CM | POA: Diagnosis not present

## 2023-02-08 DIAGNOSIS — G8929 Other chronic pain: Secondary | ICD-10-CM

## 2023-02-08 DIAGNOSIS — R600 Localized edema: Secondary | ICD-10-CM

## 2023-02-08 DIAGNOSIS — M25662 Stiffness of left knee, not elsewhere classified: Secondary | ICD-10-CM

## 2023-02-08 DIAGNOSIS — M6281 Muscle weakness (generalized): Secondary | ICD-10-CM
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
## 2023-02-11 ENCOUNTER — Encounter: Payer: Self-pay | Admitting: Rehabilitative and Restorative Service Providers"

## 2023-02-11 ENCOUNTER — Ambulatory Visit: Payer: Federal, State, Local not specified - PPO | Admitting: Rehabilitative and Restorative Service Providers"

## 2023-02-11 DIAGNOSIS — R600 Localized edema: Secondary | ICD-10-CM

## 2023-02-11 DIAGNOSIS — G8929 Other chronic pain: Secondary | ICD-10-CM

## 2023-02-11 DIAGNOSIS — M25562 Pain in left knee: Secondary | ICD-10-CM | POA: Diagnosis not present

## 2023-02-11 DIAGNOSIS — M6281 Muscle weakness (generalized): Secondary | ICD-10-CM

## 2023-02-11 DIAGNOSIS — M25662 Stiffness of left knee, not elsewhere classified: Secondary | ICD-10-CM | POA: Diagnosis not present

## 2023-02-11 DIAGNOSIS — R262 Difficulty in walking, not elsewhere classified: Secondary | ICD-10-CM
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
## 2023-02-15 ENCOUNTER — Encounter: Payer: Self-pay | Admitting: Rehabilitative and Restorative Service Providers"

## 2023-02-15 ENCOUNTER — Ambulatory Visit: Payer: Federal, State, Local not specified - PPO | Admitting: Rehabilitative and Restorative Service Providers"

## 2023-02-15 DIAGNOSIS — M6281 Muscle weakness (generalized): Secondary | ICD-10-CM

## 2023-02-15 DIAGNOSIS — R262 Difficulty in walking, not elsewhere classified: Secondary | ICD-10-CM | POA: Diagnosis not present

## 2023-02-15 DIAGNOSIS — M25562 Pain in left knee: Secondary | ICD-10-CM

## 2023-02-15 DIAGNOSIS — M25662 Stiffness of left knee, not elsewhere classified: Secondary | ICD-10-CM | POA: Diagnosis not present

## 2023-02-15 DIAGNOSIS — R600 Localized edema: Secondary | ICD-10-CM

## 2023-02-15 DIAGNOSIS — G8929 Other chronic pain: Secondary | ICD-10-CM
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
## 2023-02-16 ENCOUNTER — Encounter: Payer: Self-pay | Admitting: Oncology

## 2023-02-18 ENCOUNTER — Ambulatory Visit (INDEPENDENT_AMBULATORY_CARE_PROVIDER_SITE_OTHER): Payer: Federal, State, Local not specified - PPO | Admitting: Orthopaedic Surgery

## 2023-02-18 ENCOUNTER — Encounter: Payer: Federal, State, Local not specified - PPO | Admitting: Rehabilitative and Restorative Service Providers"

## 2023-02-18 ENCOUNTER — Encounter: Payer: Self-pay | Admitting: Orthopaedic Surgery

## 2023-02-18 DIAGNOSIS — Z96652 Presence of left artificial knee joint: Secondary | ICD-10-CM

## 2023-02-18 NOTE — Progress Notes (Signed)
 The patient is past 6 weeks status post a left knee replacement.  We converted a partial knee to a total knee.  He is doing great and has already been released by physical therapy.  He does have a known arthritic right knee and is thinking about when the timing will be for his right knee to be replaced.  He is walking without assistive device.  He is only taken Aleve.  His range of motion is full and the knee feels stable.  From my standpoint we will see him back in 6 weeks.  At that visit we will have a standing AP and lateral of his left knee and then can go from there in terms of thinking about when to schedule his other knee replacement.

## 2023-02-22 ENCOUNTER — Encounter: Payer: Federal, State, Local not specified - PPO | Admitting: Rehabilitative and Restorative Service Providers"

## 2023-02-25 ENCOUNTER — Encounter: Payer: Federal, State, Local not specified - PPO | Admitting: Rehabilitative and Restorative Service Providers"

## 2023-03-25 ENCOUNTER — Encounter: Payer: Self-pay | Admitting: Oncology

## 2023-04-01 ENCOUNTER — Encounter: Payer: Self-pay | Admitting: Oncology

## 2023-04-01 ENCOUNTER — Other Ambulatory Visit (INDEPENDENT_AMBULATORY_CARE_PROVIDER_SITE_OTHER): Payer: Self-pay

## 2023-04-01 ENCOUNTER — Ambulatory Visit (INDEPENDENT_AMBULATORY_CARE_PROVIDER_SITE_OTHER): Payer: BC Managed Care – PPO | Admitting: Orthopaedic Surgery

## 2023-04-01 ENCOUNTER — Encounter: Payer: Self-pay | Admitting: Orthopaedic Surgery

## 2023-04-01 DIAGNOSIS — Z96652 Presence of left artificial knee joint: Secondary | ICD-10-CM

## 2023-04-01 NOTE — Progress Notes (Signed)
 The patient is now about 3 months status post removal of a previous left unicompartmental knee arthroplasty and converting to a total knee arthroplasty.  This was done toward the end of November of last year.  He does have known tricompartment arthritis of his right knee.  He reports that he is doing well overall.  He does not walk with assist device.  He reports excellent range of motion of his left knee.  He has known significant arthritis of his right knee and would like to talk about having that knee replaced.  He does also have a remote history of surgery on the right knee with some type of ligamentous reconstruction.  There is retained hardware in the proximal medial tibia.  Examination of his left operative knee shows he does have some swelling to be expected but his extension is full and his flexion is full.  The knee feels limply stable.  2 views of the left knee standing also show AP of the right knee.  The right knee has varus malalignment with known bone-on-bone wear the medial compartment.  There is retained clip in the proximal tibia.  His left knee has a well-seated total knee arthroplasty with no complicating features.  At this point he would like to be scheduled for his right total knee arthroplasty in early June of this year.  He is on Eliquis and knows that he needs to stop that for 3 full days prior to surgery.  His last dose of Eliquis would be on Monday evening the week of his surgery on that Friday.  We will work on getting this scheduled and we will be in touch.  All questions and concerns were addressed and answered.

## 2023-04-05 ENCOUNTER — Encounter: Payer: Self-pay | Admitting: Oncology

## 2023-04-09 ENCOUNTER — Telehealth: Payer: Self-pay

## 2023-04-09 NOTE — Telephone Encounter (Signed)
 I called patient to discuss scheduling surgery.  Left voice mail message for return call.

## 2023-05-19 ENCOUNTER — Other Ambulatory Visit (HOSPITAL_COMMUNITY): Payer: Self-pay | Admitting: Cardiovascular Disease

## 2023-05-24 ENCOUNTER — Encounter: Payer: Self-pay | Admitting: Oncology

## 2023-05-26 ENCOUNTER — Other Ambulatory Visit (HOSPITAL_COMMUNITY): Payer: Self-pay | Admitting: Cardiovascular Disease

## 2023-06-22 ENCOUNTER — Encounter: Payer: Self-pay | Admitting: Cardiovascular Disease

## 2023-06-22 ENCOUNTER — Ambulatory Visit: Attending: Cardiovascular Disease | Admitting: Cardiovascular Disease

## 2023-06-22 VITALS — BP 140/72 | HR 52 | Ht 69.0 in | Wt 267.0 lb

## 2023-06-22 DIAGNOSIS — R600 Localized edema: Secondary | ICD-10-CM | POA: Insufficient documentation

## 2023-06-22 DIAGNOSIS — Z01818 Encounter for other preprocedural examination: Secondary | ICD-10-CM | POA: Insufficient documentation

## 2023-06-22 DIAGNOSIS — G4733 Obstructive sleep apnea (adult) (pediatric): Secondary | ICD-10-CM

## 2023-06-22 DIAGNOSIS — Z6839 Body mass index (BMI) 39.0-39.9, adult: Secondary | ICD-10-CM

## 2023-06-22 DIAGNOSIS — E6609 Other obesity due to excess calories: Secondary | ICD-10-CM

## 2023-06-22 DIAGNOSIS — I480 Paroxysmal atrial fibrillation: Secondary | ICD-10-CM | POA: Diagnosis not present

## 2023-06-22 DIAGNOSIS — E782 Mixed hyperlipidemia: Secondary | ICD-10-CM

## 2023-06-22 DIAGNOSIS — R079 Chest pain, unspecified: Secondary | ICD-10-CM | POA: Diagnosis not present

## 2023-06-22 DIAGNOSIS — I10 Essential (primary) hypertension: Secondary | ICD-10-CM

## 2023-06-22 DIAGNOSIS — E66812 Obesity, class 2: Secondary | ICD-10-CM

## 2023-06-22 NOTE — Assessment & Plan Note (Signed)
 Morbid obesity with BMI of 40.  He did lose 40 pounds on Ozempic but had to stop it because of financial issues and gained all the weight back and then some.

## 2023-06-22 NOTE — Assessment & Plan Note (Signed)
History of hyperlipidemia on Crestor followed by his PCP 

## 2023-06-22 NOTE — Patient Instructions (Signed)
 Medication Instructions:  Your physician recommends that you continue on your current medications as directed. Please refer to the Current Medication list given to you today.    *If you need a refill on your cardiac medications before your next appointment, please call your pharmacy*   Lab Work: None    If you have labs (blood work) drawn today and your tests are completely normal, you will receive your results only by: MyChart Message (if you have MyChart) OR A paper copy in the mail If you have any lab test that is abnormal or we need to change your treatment, we will call you to review the results.   Testing/Procedures:    Please report to Radiology at the [facility] Main Entrance 30 minutes early for your test.  [address] [place], [place] [address]   How to Prepare for Your Cardiac PET/CT Stress Test:  Nothing to eat or drink, except water, 3 hours prior to arrival time.  NO caffeine/decaffeinated products, or chocolate 12 hours prior to arrival. (Please note decaffeinated beverages (teas/coffees) still contain caffeine).  If you have caffeine within 12 hours prior, the test will need to be rescheduled.  Medication instructions: Do not take erectile dysfunction medications for 72 hours prior to test (sildenafil, tadalafil) Do not take nitrates (isosorbide mononitrate, Ranexa) the day before or day of test Do not take tamsulosin the day before or morning of test Hold theophylline containing medications for 12 hours. Hold Dipyridamole 48 hours prior to the test.  Diabetic Preparation: If able to eat breakfast prior to 3 hour fasting, you may take all medications, including your insulin. Do not worry if you miss your breakfast dose of insulin - start at your next meal. If you do not eat prior to 3 hour fast-Hold all diabetes (oral and insulin) medications. Patients who wear a continuous glucose monitor MUST remove the device prior to scanning.  You may  take your remaining medications with water.  NO perfume, cologne or lotion on chest or abdomen area. FEMALES - Please avoid wearing dresses to this appointment.  Total time is 1 to 2 hours; you may want to bring reading material for the waiting time.  IF YOU THINK YOU MAY BE PREGNANT, OR ARE NURSING PLEASE INFORM THE TECHNOLOGIST.  In preparation for your appointment, medication and supplies will be purchased.  Appointment availability is limited, so if you need to cancel or reschedule, please call the Radiology Department Scheduler at [phone] 24 hours in advance to avoid a cancellation fee of $100.00  What to Expect When you Arrive:  Once you arrive and check in for your appointment, you will be taken to a preparation room within the Radiology Department.  A technologist or Nurse will obtain your medical history, verify that you are correctly prepped for the exam, and explain the procedure.  Afterwards, an IV will be started in your arm and electrodes will be placed on your skin for EKG monitoring during the stress portion of the exam. Then you will be escorted to the PET/CT scanner.  There, staff will get you positioned on the scanner and obtain a blood pressure and EKG.  During the exam, you will continue to be connected to the EKG and blood pressure machines.  A small, safe amount of a radioactive tracer will be injected in your IV to obtain a series of pictures of your heart along with an injection of a stress agent.    After your Exam:  It  is recommended that you eat a meal and drink a caffeinated beverage to counter act any effects of the stress agent.  Drink plenty of fluids for the remainder of the day and urinate frequently for the first couple of hours after the exam.  Your doctor will inform you of your test results within 7-10 business days.  For more information and frequently asked questions, please visit our website: [email or web address]  For questions about  your test or how to prepare for your test, please call: Cardiac Imaging Nurse Navigators Office: [phone]    Follow-Up: At [facility], you and your health needs are our priority.  As part of our continuing mission to provide you with exceptional heart care, we have created designated Provider Care Teams.  These Care Teams include your primary Cardiologist (physician) and Advanced Practice Providers (APPs -  Physician Assistants and Nurse Practitioners) who all work together to provide you with the care you need, when you need it.  We recommend signing up for the patient portal called "MyChart".  Sign up information is provided on this After Visit Summary.  MyChart is used to connect with patients for Virtual Visits (Telemedicine).  Patients are able to view lab/test results, encounter notes, upcoming appointments, etc.  Non-urgent messages can be sent to your provider as well.   To learn more about what you can do with MyChart, go to [email or web address].    Your next appointment:   1 year(s)  The format for your next appointment:   In Person  Provider:   Lauro Portal, MD   Other Instructions

## 2023-06-22 NOTE — Progress Notes (Signed)
 06/22/2023 [name]   [birth year]  [id number]  Primary Physician Imelda Man, MD Primary Cardiologist: Avanell Leigh MD Bennye Bravo, [place]  HPI:  [name] is a 74 y.o. married Caucasian male father of 3 daughters referred by Dr. Schuyler Custard for cardiovascular evaluation because of new onset A. Fib.  I last saw him in the office   05/03/2020.  He is accompanied by his wife [name] today.. I actually took care of Mr. Cassaro 4 to 5 years ago and prior to that back in 2007.  He has a history of treated hypertension and untreated mild hyperlipidemia as well as family history of heart disease with maternal grandfather who had CABG in his 57s and a father who had a myocardial infarction in his 8s.  He had negative stress test in the past both in 2007 and again in 2015.  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  Dr. Schuyler Custard follows his thyroid  functions.  He is on thyroid  replacement therapy.  He is recently been demonstrated to be in atrial fibrillation which he thinks is been in for several months and was begun on Eliquis  oral anticoagulation.  He also has obstructive sleep apnea on CPAP.  He does drink ice tea several times a day decaffeinated coffee.  He does not drink alcohol.   He underwent successful outpatient DC cardioversion by Dr. Maximo Spar 01/17/2018 with marked 1 shock restoring sinus rhythm.  He felt clinically improved after that.  He has remained on Eliquis  oral anticoagulation.  He suspects he went into brief PAF yesterday after he had outpatient surgery and is back on his Eliquis .  He did have a negative GXT on 02/04/2018 as well as a 2D echo that showed normal normal LV systolic function, mild LV cavity dilatation mild LVH with moderate left atrial enlargement.   Since I saw him in the office 3 years ago he had a left total knee replacement 11/24 after being preoperatively  cleared by Lawana Pray, NP in June.  He is scheduled for right total knee replacement 07/09/2023 by Dr. Lucienne Ryder.  He did lose 40 pounds on Ozempic but gained it all back after stopping the medication.  He does admit to dietary indiscretion.  He has 1+ lower extremity edema.  Also complains of some upper chest discomfort.  Current Meds  Medication Sig   apixaban  (ELIQUIS ) 5 MG TABS tablet Take 5 mg by mouth 2 (two) times daily.   cholecalciferol (VITAMIN D3) 25 MCG (1000 UT) tablet Take 1,000 Units by mouth daily.   co-enzyme Q-10 30 MG capsule Take 30 mg by mouth 3 (three) times daily.   diltiazem  (CARDIZEM  CD) 120 MG 24 hr capsule TAKE 1 CAPSULE(120 MG) BY MOUTH DAILY   ezetimibe (ZETIA) 10 MG tablet Take 10 mg by mouth daily.   flecainide  (TAMBOCOR ) 100 MG tablet TAKE 1 TABLET(100 MG) BY MOUTH TWICE DAILY   furosemide (LASIX) 20 MG tablet Take 20 mg by mouth daily.   levothyroxine  (SYNTHROID , LEVOTHROID) 175 MCG tablet Take 175 mcg by mouth daily before breakfast.    rosuvastatin (CRESTOR) 10 MG tablet Take 10 mg by mouth daily.   telmisartan -hydrochlorothiazide  (MICARDIS  HCT) 80-25 MG tablet Take 1 tablet by mouth daily.   Testosterone 1.62 % GEL Apply 2 Pump topically in the morning.     Allergies  Allergen Reactions   Penicillins Anaphylaxis  Pt reports throat swelling, difficulty breathing at age 27 Has patient had a PCN reaction causing immediate rash, facial/tongue/throat swelling, SOB or lightheadedness with hypotension: yes Has patient had a PCN reaction causing severe rash involving mucus membranes or skin necrosis: no Has patient had a PCN reaction that required hospitalization no Has patient had a PCN reaction occurring within the last 10 years: no If all of the above answers are "NO", then may proceed with Cephalosporin use.     Social History   Socioeconomic History   Marital status: Married    Spouse name: Not on file   Number of children: Not on file   Years of  education: Not on file   Highest education level: Not on file  Occupational History   Not on file  Tobacco Use   Smoking status: Former    Types: Pipe    Quit date: 1980    Years since quitting: 45.3   Smokeless tobacco: Never   Tobacco comments:    quit 40 years ago  Vaping Use   Vaping status: Never Used  Substance and Sexual Activity   Alcohol use: Yes    Comment: rarely , glass of wine once a year   Drug use: No   Sexual activity: Yes  Other Topics Concern   Not on file  Social History Narrative   Lives in [place] with spouse.    Retired from the [employer].   Social Drivers of Corporate investment banker Strain: Not on file  Food Insecurity: No Food Insecurity (01/01/2023)   Hunger Vital Sign    Worried About Running Out of Food in the Last Year: Never true    Ran Out of Food in the Last Year: Never true  Transportation Needs: No Transportation Needs (01/01/2023)   PRAPARE - Administrator, Civil Service (Medical): No    Lack of Transportation (Non-Medical): No  Physical Activity: Not on file  Stress: Not on file  Social Connections: Not on file  Intimate Partner Violence: Not At Risk (01/01/2023)   Humiliation, Afraid, Rape, and Kick questionnaire    Fear of Current or Ex-Partner: No    Emotionally Abused: No    Physically Abused: No    Sexually Abused: No     Review of Systems: General: negative for chills, fever, night sweats or weight changes.  Cardiovascular: negative for chest pain, dyspnea on exertion, edema, orthopnea, palpitations, paroxysmal nocturnal dyspnea or shortness of breath Dermatological: negative for rash Respiratory: negative for cough or wheezing Urologic: negative for hematuria Abdominal: negative for nausea, vomiting, diarrhea, bright red blood per rectum, melena, or hematemesis Neurologic: negative for visual changes, syncope, or dizziness All other systems reviewed and are otherwise negative except as noted  above.    Blood pressure (!) 140/72, pulse (!) 52, height 5\' 9"  (1.753 m), weight 267 lb (121.1 kg), SpO2 92%.  General appearance: alert and no distress Neck: no adenopathy, no carotid bruit, no JVD, supple, symmetrical, trachea midline, and thyroid  not enlarged, symmetric, no tenderness/mass/nodules Lungs: clear to auscultation bilaterally Heart: regular rate and rhythm, S1, S2 normal, no murmur, click, rub or gallop Extremities: 1+ lower extremity edema Pulses: 2+ and symmetric Skin: Skin color, texture, turgor normal. No rashes or lesions Neurologic: Grossly normal  EKG EKG Interpretation Date/Time:  Tuesday Jun 22 2023 15:12:09 EDT Ventricular Rate:  52 PR Interval:  216 QRS Duration:  96 QT Interval:  480 QTC Calculation: 446 R Axis:   6  Text Interpretation: Sinus bradycardia with 1st degree A-V block When compared with ECG of 07-Aug-2022 08:11, No significant change was found Confirmed by Lauro Portal [phone]) on 06/22/2023 3:16:45 PM    ASSESSMENT AND PLAN:   Chest pain with moderate risk for cardiac etiology Patient has had upper chest pain for the last several months.  He has had a negative GXT 02/04/2018.  He does have multiple positive cardiac risk factors including family history.  I am going to get a cardiac PET study to further evaluate.  HTN (hypertension) History of essential hypertension with blood pressure measured today at 140/72.  He is on diltiazem  and Micardis /hydrochlorothiazide .  HLD (hyperlipidemia) History of hyperlipidemia on Crestor followed by his PCP  Obesity Morbid obesity with BMI of 40.  He did lose 40 pounds on Ozempic but had to stop it because of financial issues and gained all the weight back and then some.  Obstructive sleep apnea History of obstructive sleep apnea on CPAP  Paroxysmal atrial fibrillation (HCC) History of PAF status post cardioversion in the past by Dr. Maximo Spar 01/17/2018 restoring sinus rhythm.  He is on Eliquis  oral  anticoagulation and flecainide .  He has not had no clinical recurrence.  Bilateral lower extremity edema 1+ lower extremity edema.  He is on hydrochlorothiazide  25 mg.  He admits to dietary indiscretion.  He apparently had a recent 2D echocardiogram by his PCP.  Preoperative clearance Patient scheduled for right total knee replacement by Dr. Lucienne Ryder 07/09/2023.  He has had lower extremity edema and some upper chest discomfort.  His PCP did get a 2D echo recently which we will obtain a copy of.  I am going to get a cardiac PET stress study to risk stratify him.     Avanell Leigh MD FACP,FACC,FAHA, [facility] 06/22/2023 3:39 PM

## 2023-06-22 NOTE — Assessment & Plan Note (Signed)
 Patient scheduled for right total knee replacement by Dr. Lucienne Ryder 07/09/2023.  He has had lower extremity edema and some upper chest discomfort.  His PCP did get a 2D echo recently which we will obtain a copy of.  I am going to get a cardiac PET stress study to risk stratify him.

## 2023-06-22 NOTE — Assessment & Plan Note (Signed)
 Patient has had upper chest pain for the last several months.  He has had a negative GXT 02/04/2018.  He does have multiple positive cardiac risk factors including family history.  I am going to get a cardiac PET study to further evaluate.

## 2023-06-22 NOTE — Assessment & Plan Note (Signed)
 History of essential hypertension with blood pressure measured today at 140/72.  He is on diltiazem  and Micardis /hydrochlorothiazide .

## 2023-06-22 NOTE — Assessment & Plan Note (Signed)
 History of PAF status post cardioversion in the past by Dr. Maximo Spar 01/17/2018 restoring sinus rhythm.  He is on Eliquis  oral anticoagulation and flecainide .  He has not had no clinical recurrence.

## 2023-06-22 NOTE — Assessment & Plan Note (Signed)
 1+ lower extremity edema.  He is on hydrochlorothiazide  25 mg.  He admits to dietary indiscretion.  He apparently had a recent 2D echocardiogram by his PCP.

## 2023-06-22 NOTE — Assessment & Plan Note (Signed)
 History of obstructive sleep apnea on CPAP.

## 2023-06-27 NOTE — Patient Instructions (Addendum)
 SURGICAL WAITING ROOM VISITATION Patients having surgery or a procedure may have no more than 2 support people in the waiting area - these visitors may rotate in the visitor waiting room.     PRE-OP VISITATION  Pre-op nurse will coordinate an appropriate time for 1 support person to accompany the patient in pre-op.  This support person may not rotate.  This visitor will be contacted when the time is appropriate for the visitor to come back in the pre-op area.   Please refer to the [facility] website for the visitor guidelines for Inpatients (after your surgery is over and you are in a regular room).   You are not required to quarantine at this time prior to your surgery. However, you must do this: Hand Hygiene often Do NOT share personal items Notify your provider if you are in close contact with someone who has COVID or you develop fever 100.4 or greater, new onset of sneezing, cough, sore throat, shortness of breath or body aches.  If you test positive for Covid or have been in contact with anyone that has tested positive in the last 10 days please notify you surgeon.     Your procedure is scheduled on:  Friday Jul 09, 2023   Report to [facility] Main Entrance: [name] entrance where the [employer] is available.    Report to admitting at: 09:00    AM   Call this number if you have any questions or problems the morning of surgery [phone]   Do not eat food after Midnight the night prior to your surgery/procedure.   After Midnight you may have the following liquids until    08:30 AM  DAY OF SURGERY   Clear Liquid Diet Water Black Coffee (sugar ok, NO MILK/CREAM OR CREAMERS)  Tea (sugar ok, NO MILK/CREAM OR CREAMERS) regular and decaf                             Plain Jell-O  with no fruit (NO RED)                                           Fruit ices (not with fruit pulp, NO RED)                                     Popsicles (NO RED)                                                                   Juice: NO CITRUS JUICES: only apple, WHITE grape, WHITE cranberry Sports drinks like Gatorade or Powerade (NO RED)                           The day of surgery:  Drink ONE (1) Pre-Surgery Clear Ensure at  08:30  AM the morning of surgery. Drink in one sitting. Do not sip.  This drink was given to you during your hospital pre-op appointment visit. Nothing else to drink after completing  the Pre-Surgery Clear Ensure : No candy, chewing gum or throat lozenges.     FOLLOW ANY ADDITIONAL PRE OP INSTRUCTIONS YOU RECEIVED FROM YOUR SURGEON'S OFFICE!!!    Oral Hygiene is also important to reduce your risk of infection.        Remember - BRUSH YOUR TEETH THE MORNING OF SURGERY WITH YOUR REGULAR TOOTHPASTE   Do NOT smoke after Midnight the night before surgery.   ELIQUIS - Stop taking 72 hours before your surgery. Last dose will be taken on Monday Jul 05, 2023   STOP TAKING all Vitamins, Herbs and supplements 1 week before your surgery.    Take ONLY these medicines the morning of surgery with A SIP OF WATER: Flecainide  (Tambocor ) , Diltiazem  (Cardizem ), levothyroxine  (Synthroid ).    DO NOT TAKE Telmisartan  / hydrochlorothiazide      the morning of your surgery.                                                                      If You have been diagnosed with Sleep Apnea - Bring CPAP mask and tubing day of surgery. We will provide you with a CPAP machine on the day of your surgery.                   You may not have any metal on your body including  jewelry, and body piercing   Do not wear , lotions, powders, cologne, or deodorant   Men may shave face and neck.   Contacts, Hearing Aids, dentures or bridgework may not be worn into surgery. DENTURES WILL BE REMOVED PRIOR TO SURGERY PLEASE DO NOT APPLY "Poly grip" OR ADHESIVES!!!   You may bring a small overnight bag with you on the day of surgery, only pack items that are not valuable. [name] IS NOT  RESPONSIBLE   FOR VALUABLES THAT ARE LOST OR STOLEN.    Do not bring your home medications to the hospital. The Pharmacy will dispense medications listed on your medication list to you during your admission in the Hospital.   Special Instructions: Bring a copy of your healthcare power of attorney and living will documents the day of surgery, if you wish to have them scanned into your Secretary Medical Records- EPIC   Please read over the following fact sheets you were given: IF YOU HAVE QUESTIONS ABOUT YOUR PRE-OP INSTRUCTIONS, PLEASE CALL [phone].        Pre-operative 5 CHG Bath Instructions    You can play a key role in reducing the risk of infection after surgery. Your skin needs to be as free of germs as possible. You can reduce the number of germs on your skin by washing with CHG (chlorhexidine  gluconate) soap before surgery. CHG is an antiseptic soap that kills germs and continues to kill germs even after washing.    DO NOT use if you have an allergy to chlorhexidine /CHG or antibacterial soaps. If your skin becomes reddened or irritated, stop using the CHG and notify one of our RNs at [phone]   Please shower with the CHG soap starting 4 days before surgery using the following schedule: START SHOWERS ON MONDAY  Jul 05, 2023  Please keep in mind the following:  DO NOT shave, including legs and underarms, starting the day of your first shower.   You may shave your face at any point before/day of surgery.    Place clean sheets on your bed the day you start using CHG soap. Use a clean washcloth (not used since being washed) for each shower. DO NOT sleep with pets once you start using the CHG.    CHG Shower Instructions:  If you choose to wash your hair and private area, wash first with your  normal shampoo/soap.  After you use shampoo/soap, rinse your hair and body thoroughly to remove shampoo/soap residue.  Turn the water OFF and apply about 3 tablespoons (45 ml) of CHG soap to a CLEAN washcloth.  Apply CHG soap ONLY FROM YOUR NECK DOWN TO YOUR TOES (washing for 3-5 minutes)  DO NOT use CHG soap on face, private areas, open wounds, or sores.  Pay special attention to the area where your surgery is being performed.  If you are having back surgery, having someone wash your back for you may be helpful.   Wait 2 minutes after CHG soap is applied, then you may rinse off the CHG soap.  Pat dry with a clean towel  Put on clean clothes/pajamas   If you choose to wear lotion, please use ONLY the CHG-compatible lotions on the back of this paper.     Additional instructions for the day of surgery: DO NOT APPLY any lotions, deodorants, cologne, or perfumes.   Put on clean/comfortable clothes.  Brush your teeth.  Ask your nurse before applying any prescription medications to the skin.          CHG Compatible Lotions    Aveeno Moisturizing lotion  Cetaphil Moisturizing Cream  Cetaphil Moisturizing Lotion  Clairol Herbal Essence Moisturizing Lotion, Dry Skin  Clairol Herbal Essence Moisturizing Lotion, Extra Dry Skin  Clairol Herbal Essence Moisturizing Lotion, Normal Skin  Curel Age Defying Therapeutic Moisturizing Lotion with Alpha Hydroxy  Curel Extreme Care Body Lotion  Curel Soothing Hands Moisturizing Hand Lotion  Curel Therapeutic Moisturizing Cream, Fragrance-Free  Curel Therapeutic Moisturizing Lotion, Fragrance-Free  Curel Therapeutic Moisturizing Lotion, Original Formula  Eucerin Daily Replenishing Lotion  Eucerin Dry Skin Therapy Plus Alpha Hydroxy Crme  Eucerin Dry Skin Therapy Plus Alpha Hydroxy Lotion  Eucerin Original Crme  Eucerin Original Lotion  Eucerin Plus Crme Eucerin Plus Lotion  Eucerin TriLipid Replenishing Lotion  Keri Anti-Bacterial Hand  Lotion  Keri Deep Conditioning Original Lotion Dry Skin Formula Softly Scented  Keri Deep Conditioning Original Lotion, Fragrance Free Sensitive Skin Formula  Keri Lotion Fast Absorbing Fragrance Free Sensitive Skin Formula  Keri Lotion Fast Absorbing Softly Scented Dry Skin Formula  Keri Original Lotion  Keri Skin Renewal Lotion Keri Silky Smooth Lotion  Keri Silky Smooth Sensitive Skin Lotion  Nivea Body Creamy Conditioning Oil  Nivea Body Extra Enriched Lotion  Nivea Body Original Lotion  Nivea Body Sheer Moisturizing Lotion Nivea Crme  Nivea Skin Firming Lotion  NutraDerm 30 Skin Lotion  NutraDerm Skin Lotion  NutraDerm Therapeutic Skin Cream  NutraDerm Therapeutic Skin Lotion  ProShield Protective Hand Cream  Provon moisturizing lotion     FAILURE TO FOLLOW THESE INSTRUCTIONS MAY RESULT IN THE CANCELLATION OF YOUR SURGERY   PATIENT SIGNATURE_________________________________   NURSE SIGNATURE__________________________________   ________________________________________________________________________      [name]      An incentive spirometer is a tool that can help keep your lungs clear and active.  This tool measures how well you are filling your lungs with each breath. Taking long deep breaths may help reverse or decrease the chance of developing breathing (pulmonary) problems (especially infection) following: A long period of time when you are unable to move or be active. BEFORE THE PROCEDURE  If the spirometer includes an indicator to show your best effort, your nurse or respiratory therapist will set it to a desired goal. If possible, sit up straight or lean slightly forward. Try not to slouch. Hold the incentive spirometer in an upright position. INSTRUCTIONS FOR USE  Sit on the edge of your bed if possible, or sit up as far as you can in bed or on a chair. Hold the incentive spirometer in an upright position. Breathe out normally. Place the  mouthpiece in your mouth and seal your lips tightly around it. Breathe in slowly and as deeply as possible, raising the piston or the ball toward the top of the column. Hold your breath for 3-5 seconds or for as long as possible. Allow the piston or ball to fall to the bottom of the column. Remove the mouthpiece from your mouth and breathe out normally. Rest for a few seconds and repeat Steps 1 through 7 at least 10 times every 1-2 hours when you are awake. Take your time and take a few normal breaths between deep breaths. The spirometer may include an indicator to show your best effort. Use the indicator as a goal to work toward during each repetition. After each set of 10 deep breaths, practice coughing to be sure your lungs are clear. If you have an incision (the cut made at the time of surgery), support your incision when coughing by placing a pillow or rolled up towels firmly against it. Once you are able to get out of bed, walk around indoors and cough well. You may stop using the incentive spirometer when instructed by your caregiver.  RISKS AND COMPLICATIONS Take your time so you do not get dizzy or light-headed. If you are in pain, you may need to take or ask for pain medication before doing incentive spirometry. It is harder to take a deep breath if you are having pain. AFTER USE Rest and breathe slowly and easily. It can be helpful to keep track of a log of your progress. Your caregiver can provide you with a simple table to help with this. If you are using the spirometer at home, follow these instructions: SEEK MEDICAL CARE IF:  You are having difficultly using the spirometer. You have trouble using the spirometer as often as instructed. Your pain medication is not giving enough relief while using the spirometer. You develop fever of 100.5 F (38.1 C) or higher.                                                                                                    SEEK IMMEDIATE MEDICAL  CARE IF:  You cough up bloody sputum that had not been present before. You develop fever of 102 F (38.9 C) or greater. You develop worsening pain at or near  the incision site. MAKE SURE YOU:  Understand these instructions. Will watch your condition. Will get help right away if you are not doing well or get worse. Document Released: 06/08/2006 Document Revised: 04/20/2011 Document Reviewed: 08/09/2006 [facility] Patient Information 2014 [place], [place].          If you would like to see a video about joint replacement:   [email or web address]

## 2023-06-27 NOTE — Progress Notes (Addendum)
 COVID Vaccine received:  []  No [x]  Yes Date of any COVID positive Test in last 90 days: None   PCP - Imelda Man, MD   [phone]  Cardiologist - Lauro Portal, MD ,  Lawana Pray, NP clearance pending Has PET CT Cardiac perfusion scheduled for 07-01-23 per Dr. Katheryne Pane  Chest x-ray - 05-03-2015  2v  Epic EKG - 06-22-2023  Epic Stress Test - 2019  Epic ECHO - 12-20-2017  Epic Cardiac Cath -    Pacemaker / ICD device [x]  No []  Yes   Spinal Cord Stimulator:[x]  No []  Yes       History of Sleep Apnea? []  No [x]  Yes   CPAP used?- []  No [x]  Yes     Does the patient monitor blood sugar?   [x]  N/A   []  No []  Yes  Patient has: [x]  NO Hx DM   []  Pre-DM   []  DM1  []   DM2   Blood Thinner / Instructions:  Eliquis   Hold x 3 days  Dr. Lucienne Ryder.   Aspirin  Instructions:  none   ERAS Protocol Ordered: []  No  [x]  Yes PRE-SURGERY [x]  ENSURE  []  G2   Patient is to be NPO after: 11:15   Dental hx: []  Dentures:  [x]  N/A      []  Bridge or Partial:                   []  Loose or Damaged teeth:    Comments: Patient was given the 5 CHG shower / bath instructions for TKA /revision surgery along with 2 bottles of the CHG soap. Patient will start this on:   Monday  07-05-23     Activity level: Patient is able to climb a flight of stairs without difficulty; [x]  No CP  [x]  No SOB, but would have _leg pain.  Patient can perform ADLs without assistance.    Anesthesia review: PAF / A.flutter (cardioversions x 3), HTN, OSA-CPAP   Patient denies shortness of breath, fever, cough and chest pain at PAT appointment.   Patient verbalized understanding and agreement to the Pre-Surgical Instructions that were given to them at this PAT appointment. Patient was also educated of the need to review these PAT instructions again prior to his surgery.I reviewed the appropriate phone numbers to call if they have any and questions or concerns.

## 2023-06-28 ENCOUNTER — Ambulatory Visit: Payer: Self-pay | Admitting: Cardiovascular Disease

## 2023-06-28 NOTE — Telephone Encounter (Signed)
 Pt has followup questions about testing- please review and advise

## 2023-06-29 ENCOUNTER — Encounter (HOSPITAL_COMMUNITY)
Admission: RE | Admit: 2023-06-29 | Discharge: 2023-06-29 | Disposition: A | Discharge status: Home / Self Care | Source: Ambulatory Visit | Attending: Orthopaedic Surgery | Admitting: Orthopaedic Surgery

## 2023-06-29 ENCOUNTER — Other Ambulatory Visit: Payer: Self-pay

## 2023-06-29 ENCOUNTER — Encounter (HOSPITAL_COMMUNITY): Payer: Self-pay

## 2023-06-29 VITALS — BP 115/65 | HR 52 | Temp 98.7°F | Resp 14 | Ht 69.0 in | Wt 266.8 lb

## 2023-06-29 DIAGNOSIS — Z79899 Other long term (current) drug therapy: Secondary | ICD-10-CM | POA: Diagnosis not present

## 2023-06-29 DIAGNOSIS — G4733 Obstructive sleep apnea (adult) (pediatric): Secondary | ICD-10-CM | POA: Insufficient documentation

## 2023-06-29 DIAGNOSIS — Z01818 Encounter for other preprocedural examination: Secondary | ICD-10-CM | POA: Diagnosis present

## 2023-06-29 DIAGNOSIS — R0789 Other chest pain: Secondary | ICD-10-CM | POA: Diagnosis not present

## 2023-06-29 DIAGNOSIS — I480 Paroxysmal atrial fibrillation: Secondary | ICD-10-CM | POA: Insufficient documentation

## 2023-06-29 DIAGNOSIS — Z01812 Encounter for preprocedural laboratory examination: Secondary | ICD-10-CM | POA: Diagnosis not present

## 2023-06-29 DIAGNOSIS — M1711 Unilateral primary osteoarthritis, right knee: Secondary | ICD-10-CM | POA: Insufficient documentation

## 2023-06-29 DIAGNOSIS — Z7901 Long term (current) use of anticoagulants: Secondary | ICD-10-CM | POA: Insufficient documentation

## 2023-06-29 DIAGNOSIS — I10 Essential (primary) hypertension: Secondary | ICD-10-CM

## 2023-06-29 LAB — SURGICAL PCR SCREEN
MRSA, PCR: NEGATIVE
Staphylococcus aureus: NEGATIVE

## 2023-06-29 LAB — BASIC METABOLIC PANEL WITH GFR
Anion gap: 8 (ref 5–15)
BUN: 19 mg/dL (ref 8–23)
CO2: 27 mmol/L (ref 22–32)
Calcium: 9.5 mg/dL (ref 8.9–10.3)
Chloride: 103 mmol/L (ref 98–111)
Creatinine, Ser: 0.96 mg/dL (ref 0.61–1.24)
GFR, Estimated: >60 mL/min (ref >60)
Glucose, Bld: 106 mg/dL — ABNORMAL HIGH (ref 70–99)
Potassium: 4.1 mmol/L (ref 3.5–5.1)
Sodium: 138 mmol/L (ref 135–145)

## 2023-06-29 LAB — CBC
HCT: 43.3 % (ref 39.0–52.0)
Hemoglobin: 13.8 g/dL (ref 13.0–17.0)
MCH: 28.8 pg (ref 26.0–34.0)
MCHC: 31.9 g/dL (ref 30.0–36.0)
MCV: 90.2 fL (ref 80.0–100.0)
Platelets: 223 10*3/uL (ref 150–400)
RBC: 4.8 MIL/uL (ref 4.22–5.81)
RDW: 13.6 % (ref 11.5–15.5)
WBC: 6.2 10*3/uL (ref 4.0–10.5)
nRBC: 0 % (ref 0.0–0.2)

## 2023-06-30 ENCOUNTER — Ambulatory Visit: Admitting: Cardiovascular Disease

## 2023-06-30 ENCOUNTER — Telehealth (HOSPITAL_COMMUNITY): Payer: Self-pay | Admitting: Emergency Medicine
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
## 2023-06-30 NOTE — Telephone Encounter (Signed)
 Reaching out to patient to offer assistance regarding upcoming cardiac imaging study; pt verbalizes understanding of appt date/time, parking situation and where to check in, pre-test NPO status and medications ordered, and verified current allergies; name and call back number provided for further questions should they arise Rockwell Alexandria RN Navigator Cardiac Imaging [facility] [phone] office [phone] cell

## 2023-06-30 NOTE — Telephone Encounter (Signed)
 PET  consent

## 2023-06-30 NOTE — Addendum Note (Signed)
 Addended by: Avanell Leigh on: 06/30/2023 01:33 PM   Modules accepted: Orders

## 2023-06-30 NOTE — Addendum Note (Signed)
 Addended by: Zeb Heys on: 06/30/2023 01:27 PM   Modules accepted: Orders

## 2023-07-01 ENCOUNTER — Ambulatory Visit
Admission: RE | Admit: 2023-07-01 | Discharge: 2023-07-01 | Disposition: A | Discharge status: Home / Self Care | Source: Ambulatory Visit | Attending: Cardiovascular Disease | Admitting: Cardiovascular Disease

## 2023-07-01 DIAGNOSIS — Z01818 Encounter for other preprocedural examination: Secondary | ICD-10-CM

## 2023-07-01 DIAGNOSIS — D71 Functional disorders of polymorphonuclear neutrophils: Secondary | ICD-10-CM | POA: Insufficient documentation

## 2023-07-01 DIAGNOSIS — I700 Atherosclerosis of aorta: Secondary | ICD-10-CM | POA: Insufficient documentation

## 2023-07-01 DIAGNOSIS — Z01810 Encounter for preprocedural cardiovascular examination: Secondary | ICD-10-CM | POA: Diagnosis not present

## 2023-07-01 MED ORDER — REGADENOSON 0.4 MG/5ML IV SOLN
0.4000 mg | Freq: Once | INTRAVENOUS | Status: AC
  Administered 2023-07-01: 0.4 mg via INTRAVENOUS
  Filled 2023-07-01: qty 5

## 2023-07-01 MED ORDER — REGADENOSON 0.4 MG/5ML IV SOLN
INTRAVENOUS | Status: AC
  Filled 2023-07-01: qty 5

## 2023-07-01 MED ORDER — RUBIDIUM RB82 GENERATOR (RUBYFILL)
25.0000 | PACK | Freq: Once | INTRAVENOUS | Status: AC
  Administered 2023-07-01: 25.19 via INTRAVENOUS

## 2023-07-01 MED ORDER — RUBIDIUM RB82 GENERATOR (RUBYFILL)
25.0000 | PACK | Freq: Once | INTRAVENOUS | Status: AC
  Administered 2023-07-01: 25.14 via INTRAVENOUS

## 2023-07-03 LAB — NM PET CT CARDIAC PERFUSION MULTI W/ABSOLUTE BLOODFLOW
LV dias vol: 144 mL (ref 62–150)
LV sys vol: 49 mL
MBFR: 3.76
Nuc Rest EF: 56 %
Nuc Stress EF: 66 %
Peak HR: 74 {beats}/min
Rest HR: 49 {beats}/min
Rest MBF: 0.55 ml/g/min
Rest Nuclear Isotope Dose: 25.1 mCi
SRS: 0
SSS: 0
ST Depression (mm): 0 mm
Stress MBF: 2.07 ml/g/min
Stress Nuclear Isotope Dose: 25.2 mCi
TID: 0.94

## 2023-07-03 NOTE — Progress Notes (Signed)
 Normal study, mild stable lung changes.

## 2023-07-06 NOTE — Anesthesia Preprocedure Evaluation (Signed)
 Anesthesia Evaluation  Patient identified by MRN, date of birth, ID band Patient awake    Reviewed: Allergy & Precautions, NPO status , Patient's Chart, lab work & pertinent test results  History of Anesthesia Complications Negative for: history of anesthetic complications  Airway Mallampati: III  TM Distance: >3 FB Neck ROM: Full    Dental  (+) Dental Advisory Given   Pulmonary sleep apnea and Continuous Positive Airway Pressure Ventilation , COPD,  COPD inhaler, former smoker   breath sounds clear to auscultation       Cardiovascular hypertension, Pt. on medications (-) angina + dysrhythmias Atrial Fibrillation  Rhythm:Regular Rate:Bradycardia  07/03/2023 Cardiac PET:   The study is normal. The study is low risk.   LV perfusion is normal. There is no evidence of ischemia. There is no evidence of infarction.   Rest left ventricular function is normal. Rest EF: 56%. Stress left ventricular function is normal. Stress EF: 66%. End diastolic cavity size is mildly enlarged. End systolic cavity size is normal. No evidence of transient ischemic dilation (TID) noted.   Myocardial blood flow was computed to be 0.55ml/g/min at rest and 2.52ml/g/min at stress. Global myocardial blood flow reserve was 3.76 and was normal.   Coronary calcium was present on the attenuation correction CT images. Mild coronary calcifications were present. Coronary calcifications were present in the left anterior descending artery and right coronary artery distribution(s).    Neuro/Psych   Anxiety     negative neurological ROS     GI/Hepatic negative GI ROS, Neg liver ROS,,,  Endo/Other  Hypothyroidism  Class 3 obesityBMI 39  Renal/GU negative Renal ROS     Musculoskeletal  (+) Arthritis ,    Abdominal   Peds  Hematology Eliquis : last dose 07/04/2023 Hb 13.8, plt 223   Anesthesia Other Findings   Reproductive/Obstetrics                              Anesthesia Physical Anesthesia Plan  ASA: 3  Anesthesia Plan: Spinal   Post-op Pain Management: Tylenol  PO (pre-op)* and Regional block*   Induction:   PONV Risk Score and Plan: 1 and Treatment may vary due to age or medical condition  Airway Management Planned:   Additional Equipment: None  Intra-op Plan:   Post-operative Plan:   Informed Consent: I have reviewed the patients History and Physical, chart, labs and discussed the procedure including the risks, benefits and alternatives for the proposed anesthesia with the patient or authorized representative who has indicated his/her understanding and acceptance.     Dental advisory given  Plan Discussed with: CRNA and Surgeon  Anesthesia Plan Comments: (See PAT note 06/29/2023)       Anesthesia Quick Evaluation

## 2023-07-08 DIAGNOSIS — M1711 Unilateral primary osteoarthritis, right knee: Secondary | ICD-10-CM | POA: Insufficient documentation
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
## 2023-07-09 ENCOUNTER — Encounter (HOSPITAL_COMMUNITY): Payer: Self-pay | Admitting: Orthopaedic Surgery

## 2023-07-09 ENCOUNTER — Other Ambulatory Visit: Payer: Self-pay

## 2023-07-09 ENCOUNTER — Encounter (HOSPITAL_COMMUNITY)
Admission: RE | Disposition: A | Discharge status: Home Health | Payer: Self-pay | Source: Home / Self Care | Attending: Orthopaedic Surgery

## 2023-07-09 ENCOUNTER — Ambulatory Visit (HOSPITAL_COMMUNITY): Payer: Self-pay | Admitting: Physician Assistant

## 2023-07-09 ENCOUNTER — Observation Stay (HOSPITAL_COMMUNITY)
Admission: RE | Admit: 2023-07-09 | Discharge: 2023-07-10 | Disposition: A | Discharge status: Home / Self Care | Attending: Orthopaedic Surgery | Admitting: Orthopaedic Surgery

## 2023-07-09 ENCOUNTER — Observation Stay (HOSPITAL_COMMUNITY)

## 2023-07-09 ENCOUNTER — Ambulatory Visit (HOSPITAL_COMMUNITY): Admitting: Anesthesiology

## 2023-07-09 DIAGNOSIS — I4819 Other persistent atrial fibrillation: Secondary | ICD-10-CM | POA: Insufficient documentation

## 2023-07-09 DIAGNOSIS — M1711 Unilateral primary osteoarthritis, right knee: Secondary | ICD-10-CM

## 2023-07-09 DIAGNOSIS — I10 Essential (primary) hypertension: Secondary | ICD-10-CM | POA: Diagnosis not present

## 2023-07-09 DIAGNOSIS — Z79899 Other long term (current) drug therapy: Secondary | ICD-10-CM | POA: Diagnosis not present

## 2023-07-09 DIAGNOSIS — Z87891 Personal history of nicotine dependence: Secondary | ICD-10-CM | POA: Diagnosis not present

## 2023-07-09 DIAGNOSIS — Z96652 Presence of left artificial knee joint: Secondary | ICD-10-CM | POA: Diagnosis not present

## 2023-07-09 DIAGNOSIS — Z96651 Presence of right artificial knee joint: Secondary | ICD-10-CM

## 2023-07-09 DIAGNOSIS — I480 Paroxysmal atrial fibrillation: Secondary | ICD-10-CM

## 2023-07-09 DIAGNOSIS — Z01818 Encounter for other preprocedural examination: Secondary | ICD-10-CM

## 2023-07-09 DIAGNOSIS — Z7901 Long term (current) use of anticoagulants: Secondary | ICD-10-CM | POA: Insufficient documentation

## 2023-07-09 SURGERY — ARTHROPLASTY, KNEE, TOTAL
Anesthesia: Spinal | Site: Knee | Laterality: Right

## 2023-07-09 MED ORDER — POVIDONE-IODINE 10 % EX SWAB: 2.0000 | Freq: Once | CUTANEOUS | Status: DC

## 2023-07-09 MED ORDER — PROPOFOL 10 MG/ML IV BOLUS
INTRAVENOUS | Status: DC | PRN
  Administered 2023-07-09: 95 ug/kg/min via INTRAVENOUS

## 2023-07-09 MED ORDER — DIPHENHYDRAMINE HCL 12.5 MG/5ML PO ELIX: 12.5000 mg | ORAL_SOLUTION | ORAL | Status: DC | PRN

## 2023-07-09 MED ORDER — METOCLOPRAMIDE HCL 5 MG/ML IJ SOLN: 5.0000 mg | Freq: Three times a day (TID) | INTRAMUSCULAR | Status: DC | PRN

## 2023-07-09 MED ORDER — FLECAINIDE ACETATE 100 MG PO TABS
100.0000 mg | ORAL_TABLET | Freq: Two times a day (BID) | ORAL | Status: DC
  Administered 2023-07-09 – 2023-07-10 (×2): 100 mg via ORAL
  Filled 2023-07-09 (×2): qty 1

## 2023-07-09 MED ORDER — ROPIVACAINE HCL 7.5 MG/ML IJ SOLN
INTRAMUSCULAR | Status: DC | PRN
Start: 2023-07-09 — End: 2023-07-09
  Administered 2023-07-09: 20 mL via PERINEURAL

## 2023-07-09 MED ORDER — PROPOFOL 1000 MG/100ML IV EMUL
INTRAVENOUS | Status: AC
  Filled 2023-07-09: qty 100

## 2023-07-09 MED ORDER — TELMISARTAN-HCTZ 80-25 MG PO TABS: 1.0000 | ORAL_TABLET | Freq: Every day | ORAL | Status: DC

## 2023-07-09 MED ORDER — BUPIVACAINE-EPINEPHRINE (PF) 0.25% -1:200000 IJ SOLN
INTRAMUSCULAR | Status: AC
Start: 2023-07-09 — End: ?
  Filled 2023-07-09: qty 30

## 2023-07-09 MED ORDER — HYDROMORPHONE HCL 1 MG/ML IJ SOLN: 0.5000 mg | INTRAMUSCULAR | Status: DC | PRN

## 2023-07-09 MED ORDER — TRANEXAMIC ACID-NACL 1000-0.7 MG/100ML-% IV SOLN
1000.0000 mg | INTRAVENOUS | Status: DC
  Filled 2023-07-09: qty 100

## 2023-07-09 MED ORDER — ORAL CARE MOUTH RINSE: 15.0000 mL | Freq: Once | OROMUCOSAL | Status: AC

## 2023-07-09 MED ORDER — ACETAMINOPHEN 325 MG PO TABS: 325.0000 mg | ORAL_TABLET | Freq: Four times a day (QID) | ORAL | Status: DC | PRN

## 2023-07-09 MED ORDER — PANTOPRAZOLE SODIUM 40 MG PO TBEC
40.0000 mg | DELAYED_RELEASE_TABLET | Freq: Every day | ORAL | Status: DC
  Administered 2023-07-09 – 2023-07-10 (×2): 40 mg via ORAL
  Filled 2023-07-09 (×2): qty 1

## 2023-07-09 MED ORDER — GLYCOPYRROLATE 0.2 MG/ML IJ SOLN
INTRAMUSCULAR | Status: DC | PRN
  Administered 2023-07-09: .2 mg via INTRAVENOUS

## 2023-07-09 MED ORDER — OXYCODONE HCL 5 MG PO TABS: 10.0000 mg | ORAL_TABLET | ORAL | Status: DC | PRN

## 2023-07-09 MED ORDER — OXYCODONE HCL 5 MG PO TABS
ORAL_TABLET | ORAL | Status: AC
  Filled 2023-07-09: qty 1

## 2023-07-09 MED ORDER — HYDROMORPHONE HCL 1 MG/ML IJ SOLN
0.2500 mg | INTRAMUSCULAR | Status: DC | PRN
  Administered 2023-07-09 (×2): 0.5 mg via INTRAVENOUS

## 2023-07-09 MED ORDER — ORAL CARE MOUTH RINSE: 15.0000 mL | OROMUCOSAL | Status: DC | PRN

## 2023-07-09 MED ORDER — LIDOCAINE HCL (PF) 2 % IJ SOLN
INTRAMUSCULAR | Status: AC
Start: 2023-07-09 — End: ?
  Filled 2023-07-09: qty 5

## 2023-07-09 MED ORDER — METHOCARBAMOL 1000 MG/10ML IJ SOLN: 500.0000 mg | Freq: Four times a day (QID) | INTRAMUSCULAR | Status: DC | PRN

## 2023-07-09 MED ORDER — FENTANYL CITRATE PF 50 MCG/ML IJ SOSY
50.0000 ug | PREFILLED_SYRINGE | INTRAMUSCULAR | Status: DC
  Administered 2023-07-09: 50 ug via INTRAVENOUS
  Filled 2023-07-09: qty 2

## 2023-07-09 MED ORDER — VITAMIN D 25 MCG (1000 UNIT) PO TABS
1000.0000 [IU] | ORAL_TABLET | Freq: Every day | ORAL | Status: DC
  Administered 2023-07-09 – 2023-07-10 (×2): 1000 [IU] via ORAL
  Filled 2023-07-09 (×2): qty 1

## 2023-07-09 MED ORDER — MENTHOL 3 MG MT LOZG: 1.0000 | LOZENGE | OROMUCOSAL | Status: DC | PRN

## 2023-07-09 MED ORDER — ONDANSETRON HCL 4 MG/2ML IJ SOLN
INTRAMUSCULAR | Status: AC
  Filled 2023-07-09: qty 2

## 2023-07-09 MED ORDER — PHENOL 1.4 % MT LIQD: 1.0000 | OROMUCOSAL | Status: DC | PRN

## 2023-07-09 MED ORDER — PHENYLEPHRINE HCL (PRESSORS) 10 MG/ML IV SOLN
INTRAVENOUS | Status: AC
  Filled 2023-07-09: qty 1

## 2023-07-09 MED ORDER — COENZYME Q10 30 MG PO CAPS: 30.0000 mg | ORAL_CAPSULE | Freq: Every day | ORAL | Status: DC

## 2023-07-09 MED ORDER — 0.9 % SODIUM CHLORIDE (POUR BTL) OPTIME
TOPICAL | Status: DC | PRN
  Administered 2023-07-09: 1000 mL

## 2023-07-09 MED ORDER — BUPIVACAINE IN DEXTROSE 0.75-8.25 % IT SOLN
INTRATHECAL | Status: DC | PRN
Start: 2023-07-09 — End: 2023-07-09

## 2023-07-09 MED ORDER — CEFAZOLIN SODIUM-DEXTROSE 3-4 GM/150ML-% IV SOLN
3.0000 g | INTRAVENOUS | Status: AC
  Administered 2023-07-09: 3 g via INTRAVENOUS
  Filled 2023-07-09: qty 150

## 2023-07-09 MED ORDER — GLYCOPYRROLATE 0.2 MG/ML IJ SOLN
INTRAMUSCULAR | Status: AC
Start: 2023-07-09 — End: ?
  Filled 2023-07-09: qty 1

## 2023-07-09 MED ORDER — ROSUVASTATIN CALCIUM 10 MG PO TABS
10.0000 mg | ORAL_TABLET | Freq: Every day | ORAL | Status: DC
  Administered 2023-07-09 – 2023-07-10 (×2): 10 mg via ORAL
  Filled 2023-07-09 (×2): qty 1

## 2023-07-09 MED ORDER — METHOCARBAMOL 500 MG PO TABS
500.0000 mg | ORAL_TABLET | Freq: Four times a day (QID) | ORAL | Status: DC | PRN
  Administered 2023-07-09 – 2023-07-10 (×2): 500 mg via ORAL
  Filled 2023-07-09 (×2): qty 1

## 2023-07-09 MED ORDER — METOCLOPRAMIDE HCL 5 MG PO TABS: 5.0000 mg | ORAL_TABLET | Freq: Three times a day (TID) | ORAL | Status: DC | PRN

## 2023-07-09 MED ORDER — BUPIVACAINE IN DEXTROSE 0.75-8.25 % IT SOLN
INTRATHECAL | Status: DC | PRN
Start: 2023-07-09 — End: 2023-07-09
  Administered 2023-07-09: 12 mg via INTRATHECAL

## 2023-07-09 MED ORDER — APIXABAN 5 MG PO TABS
5.0000 mg | ORAL_TABLET | Freq: Two times a day (BID) | ORAL | Status: DC
  Administered 2023-07-10: 5 mg via ORAL
  Filled 2023-07-09: qty 1

## 2023-07-09 MED ORDER — ALUM & MAG HYDROXIDE-SIMETH 200-200-20 MG/5ML PO SUSP: 30.0000 mL | ORAL | Status: DC | PRN

## 2023-07-09 MED ORDER — MIDAZOLAM HCL 2 MG/2ML IJ SOLN: 0.5000 mg | Freq: Once | INTRAMUSCULAR | Status: DC | PRN

## 2023-07-09 MED ORDER — BUPIVACAINE-EPINEPHRINE 0.25% -1:200000 IJ SOLN
INTRAMUSCULAR | Status: DC | PRN
Start: 2023-07-09 — End: 2023-07-09
  Administered 2023-07-09: 30 mL

## 2023-07-09 MED ORDER — HYDROMORPHONE HCL 1 MG/ML IJ SOLN
INTRAMUSCULAR | Status: AC
  Filled 2023-07-09: qty 1

## 2023-07-09 MED ORDER — DEXAMETHASONE SODIUM PHOSPHATE 10 MG/ML IJ SOLN
INTRAMUSCULAR | Status: AC
  Filled 2023-07-09: qty 1

## 2023-07-09 MED ORDER — LIDOCAINE HCL (CARDIAC) PF 100 MG/5ML IV SOSY
PREFILLED_SYRINGE | INTRAVENOUS | Status: DC | PRN
Start: 2023-07-09 — End: 2023-07-09
  Administered 2023-07-09: 50 mg via INTRATRACHEAL

## 2023-07-09 MED ORDER — OXYCODONE HCL 5 MG/5ML PO SOLN: 5.0000 mg | Freq: Once | ORAL | Status: AC | PRN

## 2023-07-09 MED ORDER — LACTATED RINGERS IV SOLN: INTRAVENOUS | Status: DC

## 2023-07-09 MED ORDER — HYDROCHLOROTHIAZIDE 25 MG PO TABS
25.0000 mg | ORAL_TABLET | Freq: Every day | ORAL | Status: DC
  Administered 2023-07-09 – 2023-07-10 (×2): 25 mg via ORAL
  Filled 2023-07-09 (×2): qty 1

## 2023-07-09 MED ORDER — ONDANSETRON HCL 4 MG/2ML IJ SOLN: 4.0000 mg | Freq: Four times a day (QID) | INTRAMUSCULAR | Status: DC | PRN

## 2023-07-09 MED ORDER — DEXAMETHASONE SODIUM PHOSPHATE 10 MG/ML IJ SOLN
INTRAMUSCULAR | Status: DC | PRN
  Administered 2023-07-09: 8 mg via INTRAVENOUS

## 2023-07-09 MED ORDER — PRESERVISION AREDS PO CAPS: 1.0000 | ORAL_CAPSULE | Freq: Every day | ORAL | Status: DC

## 2023-07-09 MED ORDER — MEPERIDINE HCL 50 MG/ML IJ SOLN: 6.2500 mg | INTRAMUSCULAR | Status: DC | PRN

## 2023-07-09 MED ORDER — EZETIMIBE 10 MG PO TABS
10.0000 mg | ORAL_TABLET | Freq: Every day | ORAL | Status: DC
  Administered 2023-07-09 – 2023-07-10 (×2): 10 mg via ORAL
  Filled 2023-07-09 (×2): qty 1

## 2023-07-09 MED ORDER — TRANEXAMIC ACID-NACL 1000-0.7 MG/100ML-% IV SOLN
INTRAVENOUS | Status: DC | PRN
  Administered 2023-07-09: 1000 mg via INTRAVENOUS

## 2023-07-09 MED ORDER — VASOPRESSIN 20 UNIT/ML IV SOLN
INTRAVENOUS | Status: AC
Start: 2023-07-09 — End: ?
  Filled 2023-07-09: qty 1

## 2023-07-09 MED ORDER — DILTIAZEM HCL ER COATED BEADS 120 MG PO CP24
120.0000 mg | ORAL_CAPSULE | Freq: Every day | ORAL | Status: DC
  Administered 2023-07-10: 120 mg via ORAL
  Filled 2023-07-09: qty 1

## 2023-07-09 MED ORDER — SODIUM CHLORIDE 0.9 % IV SOLN
INTRAVENOUS | Status: DC
Start: 2023-07-09 — End: 2023-07-10

## 2023-07-09 MED ORDER — CEFAZOLIN SODIUM-DEXTROSE 2-4 GM/100ML-% IV SOLN
2.0000 g | Freq: Four times a day (QID) | INTRAVENOUS | Status: AC
  Administered 2023-07-09 (×2): 2 g via INTRAVENOUS
  Filled 2023-07-09 (×2): qty 100

## 2023-07-09 MED ORDER — MIDAZOLAM HCL 2 MG/2ML IJ SOLN
1.0000 mg | INTRAMUSCULAR | Status: DC
  Administered 2023-07-09: 1 mg via INTRAVENOUS
  Filled 2023-07-09: qty 2

## 2023-07-09 MED ORDER — FUROSEMIDE 20 MG PO TABS
20.0000 mg | ORAL_TABLET | Freq: Every day | ORAL | Status: DC
  Administered 2023-07-09 – 2023-07-10 (×2): 20 mg via ORAL
  Filled 2023-07-09 (×2): qty 1

## 2023-07-09 MED ORDER — IRBESARTAN 150 MG PO TABS
300.0000 mg | ORAL_TABLET | Freq: Every day | ORAL | Status: DC
  Administered 2023-07-09 – 2023-07-10 (×2): 300 mg via ORAL
  Filled 2023-07-09 (×2): qty 2

## 2023-07-09 MED ORDER — DEXMEDETOMIDINE HCL IN NACL 80 MCG/20ML IV SOLN
INTRAVENOUS | Status: AC
Start: 2023-07-09 — End: ?
  Filled 2023-07-09: qty 20

## 2023-07-09 MED ORDER — DOCUSATE SODIUM 100 MG PO CAPS
100.0000 mg | ORAL_CAPSULE | Freq: Two times a day (BID) | ORAL | Status: DC
  Administered 2023-07-09 – 2023-07-10 (×2): 100 mg via ORAL
  Filled 2023-07-09 (×2): qty 1

## 2023-07-09 MED ORDER — POLYETHYLENE GLYCOL 3350 17 G PO PACK: 17.0000 g | PACK | Freq: Every day | ORAL | Status: DC | PRN

## 2023-07-09 MED ORDER — PHENYLEPHRINE HCL (PRESSORS) 10 MG/ML IV SOLN
INTRAVENOUS | Status: AC
Start: 2023-07-09 — End: ?
  Filled 2023-07-09: qty 1

## 2023-07-09 MED ORDER — ONDANSETRON HCL 4 MG PO TABS: 4.0000 mg | ORAL_TABLET | Freq: Four times a day (QID) | ORAL | Status: DC | PRN

## 2023-07-09 MED ORDER — STERILE WATER FOR IRRIGATION IR SOLN
Status: DC | PRN
  Administered 2023-07-09: 1000 mL

## 2023-07-09 MED ORDER — LACTATED RINGERS IV SOLN
INTRAVENOUS | Status: DC | PRN
Start: 2023-07-09 — End: 2023-07-09

## 2023-07-09 MED ORDER — ACETAMINOPHEN 500 MG PO TABS
1000.0000 mg | ORAL_TABLET | Freq: Once | ORAL | Status: AC
  Administered 2023-07-09: 1000 mg via ORAL
  Filled 2023-07-09: qty 2

## 2023-07-09 MED ORDER — OXYCODONE HCL 5 MG PO TABS
5.0000 mg | ORAL_TABLET | Freq: Once | ORAL | Status: AC | PRN
  Administered 2023-07-09: 5 mg via ORAL

## 2023-07-09 MED ORDER — SODIUM CHLORIDE 0.9 % IR SOLN
Status: DC | PRN
Start: 2023-07-09 — End: 2023-07-09
  Administered 2023-07-09: 1000 mL

## 2023-07-09 MED ORDER — OXYCODONE HCL 5 MG PO TABS
5.0000 mg | ORAL_TABLET | ORAL | Status: DC | PRN
  Administered 2023-07-09 – 2023-07-10 (×4): 10 mg via ORAL
  Filled 2023-07-09 (×4): qty 2

## 2023-07-09 MED ORDER — CHLORHEXIDINE GLUCONATE 0.12 % MT SOLN
15.0000 mL | Freq: Once | OROMUCOSAL | Status: AC
  Administered 2023-07-09: 15 mL via OROMUCOSAL

## 2023-07-09 MED ORDER — PROSIGHT PO TABS
1.0000 | ORAL_TABLET | Freq: Every day | ORAL | Status: DC
  Administered 2023-07-09 – 2023-07-10 (×2): 1 via ORAL
  Filled 2023-07-09 (×2): qty 1

## 2023-07-09 MED ORDER — ONDANSETRON HCL 4 MG/2ML IJ SOLN
INTRAMUSCULAR | Status: DC | PRN
Start: 2023-07-09 — End: 2023-07-09
  Administered 2023-07-09: 4 mg via INTRAVENOUS

## 2023-07-09 MED ORDER — EPHEDRINE SULFATE-NACL 50-0.9 MG/10ML-% IV SOSY
PREFILLED_SYRINGE | INTRAVENOUS | Status: DC | PRN
  Administered 2023-07-09: 10 mg via INTRAVENOUS

## 2023-07-09 MED ORDER — PHENYLEPHRINE 80 MCG/ML (10ML) SYRINGE FOR IV PUSH (FOR BLOOD PRESSURE SUPPORT)
PREFILLED_SYRINGE | INTRAVENOUS | Status: AC
Start: 2023-07-09 — End: ?
  Filled 2023-07-09: qty 10

## 2023-07-09 MED ORDER — LEVOTHYROXINE SODIUM 100 MCG PO TABS
200.0000 ug | ORAL_TABLET | Freq: Every day | ORAL | Status: DC
  Administered 2023-07-10: 200 ug via ORAL
  Filled 2023-07-09: qty 2

## 2023-07-09 SURGICAL SUPPLY — 63 items
BAG COUNTER SPONGE SURGICOUNT (BAG) IMPLANT
BAG ZIPLOCK 12X15 (MISCELLANEOUS) ×1 IMPLANT
BENZOIN TINCTURE PRP APPL 2/3 (GAUZE/BANDAGES/DRESSINGS) IMPLANT
BIT DRILL SHORT 2.0 ZI (BIT) IMPLANT
BLADE SAG 18X100X1.27 (BLADE) ×1 IMPLANT
BLADE SURG SZ10 CARB STEEL (BLADE) IMPLANT
BNDG ELASTIC 6INX 5YD STR LF (GAUZE/BANDAGES/DRESSINGS) ×2 IMPLANT
BOWL SMART MIX CTS (DISPOSABLE) IMPLANT
CEMENT BONE R 1X40 (Cement) IMPLANT
COMPONENT FEM CMT PRSONA SZ9RT (Joint) IMPLANT
COOLER ICEMAN CLASSIC (MISCELLANEOUS) ×1 IMPLANT
COVER SURGICAL LIGHT HANDLE (MISCELLANEOUS) ×1 IMPLANT
CUFF TRNQT CYL 34X4.125X (TOURNIQUET CUFF) ×1 IMPLANT
DRAPE INCISE IOBAN 66X45 STRL (DRAPES) ×1 IMPLANT
DRAPE U-SHAPE 47X51 STRL (DRAPES) ×1 IMPLANT
DRIVER T15 RETENTION (MISCELLANEOUS) IMPLANT
DURAPREP 26ML APPLICATOR (WOUND CARE) ×1 IMPLANT
ELECT BLADE TIP CTD 4 INCH (ELECTRODE) ×1 IMPLANT
ELECT PENCIL ROCKER SW 15FT (MISCELLANEOUS) ×1 IMPLANT
ELECT REM PT RETURN 15FT ADLT (MISCELLANEOUS) ×1 IMPLANT
GAUZE PAD ABD 8X10 STRL (GAUZE/BANDAGES/DRESSINGS) ×2 IMPLANT
GAUZE SPONGE 4X4 12PLY STRL (GAUZE/BANDAGES/DRESSINGS) ×1 IMPLANT
GAUZE XEROFORM 1X8 LF (GAUZE/BANDAGES/DRESSINGS) IMPLANT
GLOVE BIO SURGEON STRL SZ7.5 (GLOVE) ×1 IMPLANT
GLOVE BIOGEL PI IND STRL 8 (GLOVE) ×2 IMPLANT
GLOVE ECLIPSE 8.0 STRL XLNG CF (GLOVE) ×1 IMPLANT
GOWN STRL REUS W/ TWL XL LVL3 (GOWN DISPOSABLE) ×2 IMPLANT
HOLDER FOLEY CATH W/STRAP (MISCELLANEOUS) IMPLANT
IMMOBILIZER KNEE 20 (SOFTGOODS) ×1 IMPLANT
IMMOBILIZER KNEE 20 THIGH 36 (SOFTGOODS) ×1 IMPLANT
INSERT TIB ASF PS 8-11 GH RT (Insert) IMPLANT
KIT TURNOVER KIT A (KITS) IMPLANT
KWIRE OLIVE PLT TACK 2.0-4 SU (WIRE) IMPLANT
MANIFOLD NEPTUNE II (INSTRUMENTS) ×1 IMPLANT
NS IRRIG 1000ML POUR BTL (IV SOLUTION) ×1 IMPLANT
PACK TOTAL KNEE CUSTOM (KITS) ×1 IMPLANT
PAD COLD SHLDR WRAP-ON (PAD) ×1 IMPLANT
PADDING CAST COTTON 6X4 STRL (CAST SUPPLIES) ×2 IMPLANT
PIN DRILL HDLS TROCAR 75 4PK (PIN) IMPLANT
PLATE 2.7 STR 6 H (Plate) IMPLANT
PROTECTOR NERVE ULNAR (MISCELLANEOUS) ×1 IMPLANT
SCREW FEMALE HEX FIX 25X2.5 (ORTHOPEDIC DISPOSABLE SUPPLIES) IMPLANT
SCREW LOCK MDS 2.7X14 (Screw) IMPLANT
SCREW LOCK MDS 2.7X16 (Screw) IMPLANT
SCREW LOCK MVX 2.7X22 (Screw) IMPLANT
SCREW LOCKING 2.7X24 (Screw) IMPLANT
SCREW NLOCK MVX 2.7X16 (Screw) IMPLANT
SET HNDPC FAN SPRY TIP SCT (DISPOSABLE) ×1 IMPLANT
SET PAD KNEE POSITIONER (MISCELLANEOUS) ×1 IMPLANT
SPIKE FLUID TRANSFER (MISCELLANEOUS) IMPLANT
STAPLER SKIN PROX 35W (STAPLE) IMPLANT
STEM POLY PAT PLY 35M KNEE (Knees) IMPLANT
STEM TIB ST PERS 14+30 (Stem) IMPLANT
STEM TIBIA 5 DEG SZ G R KNEE (Knees) IMPLANT
STRIP CLOSURE SKIN 1/2X4 (GAUZE/BANDAGES/DRESSINGS) IMPLANT
SUT MNCRL AB 4-0 PS2 18 (SUTURE) IMPLANT
SUT VIC AB 0 CT1 36 (SUTURE) ×1 IMPLANT
SUT VIC AB 1 CT1 36 (SUTURE) ×2 IMPLANT
SUT VIC AB 2-0 CT1 TAPERPNT 27 (SUTURE) ×2 IMPLANT
TOWEL GREEN STERILE FF (TOWEL DISPOSABLE) ×1 IMPLANT
TRAY FOLEY MTR SLVR 16FR STAT (SET/KITS/TRAYS/PACK) IMPLANT
WATER STERILE IRR 1000ML POUR (IV SOLUTION) ×2 IMPLANT
YANKAUER SUCT BULB TIP NO VENT (SUCTIONS) ×1 IMPLANT

## 2023-07-09 NOTE — Transfer of Care (Signed)
 Immediate Anesthesia Transfer of Care Note  Patient: [name]  Procedure(s) Performed: ARTHROPLASTY, KNEE, TOTAL (Right: Knee)  Patient Location: PACU  Anesthesia Type:Spinal  Level of Consciousness: awake, alert , and oriented  Airway & Oxygen Therapy: Patient Spontanous Breathing and Patient connected to face mask oxygen  Post-op Assessment: Report given to RN  Post vital signs: Reviewed and stable  Last Vitals:  Vitals Value Taken Time  BP 89/71 07/09/23 1401  Temp    Pulse 62 07/09/23 1403  Resp 16 07/09/23 1403  SpO2 97 % 07/09/23 1403  Vitals shown include unfiled device data.  Last Pain:  Vitals:   07/09/23 0909  TempSrc: Oral  PainSc:          Complications: No notable events documented.

## 2023-07-09 NOTE — Anesthesia Procedure Notes (Signed)
 Anesthesia Regional Block: Adductor canal block   Pre-Anesthetic Checklist: , timeout performed,  Correct Patient, Correct Site, Correct Laterality,  Correct Procedure, Correct Position, site marked,  Risks and benefits discussed,  Surgical consent,  Pre-op evaluation,  At surgeon's request and post-op pain management  Laterality: Right and Lower  Prep: chloraprep       Needles:  Injection technique: Single-shot  Needle Type: Echogenic Needle     Needle Length: 9cm  Needle Gauge: 21     Additional Needles:   Procedures:,,,, ultrasound used (permanent image in chart),,    Narrative:  Start time: 07/09/2023 10:52 AM End time: 07/09/2023 10:58 AM Injection made incrementally with aspirations every 5 mL.  Performed by: Personally  Anesthesiologist: Jonne Netters, MD  Additional Notes: Pt identified in Holding room.  Monitors applied. Working IV access confirmed. Timeout, Sterile prep R thigh.  #21ga ECHOgenic Arrow block needle into adductor canal with US  guidance.  20cc 0.75% Ropivacaine injected incrementally after negative test dose.  Patient asymptomatic, VSS, no heme aspirated, tolerated well.   Fay Hoop, MD

## 2023-07-09 NOTE — Plan of Care (Signed)
 ?  Problem: Clinical Measurements: ?Goal: Ability to maintain clinical measurements within normal limits will improve ?Outcome: Progressing ?Goal: Will remain free from infection ?Outcome: Progressing ?Goal: Diagnostic test results will improve ?Outcome: Progressing ?  ?

## 2023-07-09 NOTE — Interval H&P Note (Signed)
 History and Physical Interval Note: The patient understands that he is here today for a right total knee replacement to treat his significant right knee pain and arthritis.  There has been no acute or interval change in his medical status.  The risks and benefits of surgery have been discussed in detail and informed consent has been obtained.  The right operative knee has been marked.  07/09/2023 10:13 AM  [name]  has presented today for surgery, with the diagnosis of osteoarthritis right knee.  The various methods of treatment have been discussed with the patient and family. After consideration of risks, benefits and other options for treatment, the patient has consented to  Procedure(s): ARTHROPLASTY, KNEE, TOTAL (Right) as a surgical intervention.  The patient's history has been reviewed, patient examined, no change in status, stable for surgery.  I have reviewed the patient's chart and labs.  Questions were answered to the patient's satisfaction.     Arnie Lao

## 2023-07-09 NOTE — Op Note (Signed)
 Operative Note  Date of operation: 07/09/2023 Preoperative diagnosis: Right knee primary osteoarthritis Postoperative diagnosis: Same  Procedure: Right cemented total knee arthroplasty  Surgeon: Jeanella Milan. Lucienne Ryder, MD Assistant: Malena Scull, PA-C  Implants: Biomet/Zimmer persona cemented knee system Implant Name Type Inv. Item Serial No. Manufacturer Lot No. LRB No. Used Action  CEMENT BONE R 1X40 - [id number] Cement CEMENT BONE R 1X40  ZIMMER RECON(ORTH,TRAU,BIO,SG) [id number] Right 2 Implanted  STEM TIBIA 5 DEG SZ G R KNEE - [id number] Knees STEM TIBIA 5 DEG SZ G R KNEE  ZIMMER RECON(ORTH,TRAU,BIO,SG) [id number] Right 1 Implanted  STEM TIB ST PERS 14+30 - [id number] Stem STEM TIB ST PERS 14+30  ZIMMER RECON(ORTH,TRAU,BIO,SG) [id number] Right 1 Implanted  COMPONENT FEM CMT PRSONA SZ9RT - [id number] Joint COMPONENT FEM CMT PRSONA SZ9RT  ZIMMER RECON(ORTH,TRAU,BIO,SG) [id number] Right 1 Implanted  INSERT TIB ASF PS 8-11 GH RT - [id number] Insert INSERT TIB ASF PS 8-11 GH RT  ZIMMER RECON(ORTH,TRAU,BIO,SG) [id number] Right 1 Implanted  STEM POLY PAT PLY 66M KNEE - [id number] Knees STEM POLY PAT PLY 66M KNEE  ZIMMER RECON(ORTH,TRAU,BIO,SG) [id number] Right 1 Implanted  PLATE 2.7 STR 6 H - [id number] Plate PLATE 2.7 STR 6 H  ZIMMER RECON(ORTH,TRAU,BIO,SG)  Right 1 Implanted  SCREW LOCKING 2.7X24 - [id number] Screw SCREW LOCKING 2.7X24  ZIMMER RECON(ORTH,TRAU,BIO,SG)  Right 1 Implanted  SCREW LOCK MVX 2.7X22 - [id number] Screw SCREW LOCK MVX 2.7X22  ZIMMER RECON(ORTH,TRAU,BIO,SG)  Right 1 Implanted  SCREW LOCK MDS 2.7X14 - [id number] Screw SCREW LOCK MDS 2.7X14  ZIMMER RECON(ORTH,TRAU,BIO,SG)  Right 1 Implanted  SCREW LOCK MDS 2.7X16 - [id number] Screw SCREW LOCK MDS 2.7X16  ZIMMER RECON(ORTH,TRAU,BIO,SG)  Right 1 Implanted   Anesthesia: #1 right lower extremity adductor canal block, #2 spinal, #3 local Tourniquet time: 73 minutes Antibiotics: IV Ancef   Indications: The patient is an active  73 year old gentleman who has a history of significant arthritis in both of his knees.  He has had a successful left total knee replacement after this was revised from a partial knee replacement to a total.  He now presents to have his right knee replaced given well-documented and severe osteoarthritis of the right knee.  He also has a previous MCL injury and there is a retained orthopedic staple in the medial tibial plateau.  We had a long thorough discussion about the risks of acute blood loss anemia, nerve or vessel injury, fracture, infection, DVT, implant failure and wound healing issues.  He understands that our goals are hopefully decreased pain, improved mobility and improved quality of life.  Procedure description: After informed consent was obtained and the appropriate right knee was marked, the patient was brought to the operating room and set up on the stretcher.  In the holding room he did have a right lower extremity adductor canal block by anesthesia.  Sitting up on the operating table anesthesia obtained spinal anesthesia and he was laid in supine position a Foley catheter was placed.  A nonsterile tractors placed around his upper right thigh and his right thigh, knee, leg, ankle and foot were prepped draped with DuraPrep and sterile drapes including a sterile stockinette.  A timeout was called and he was identified as the correct patient correct right knee.  We then used Esmarch wrap out the leg and the tourniquet was inflated to 300 mm pressure.  Next with the knee extended a direct midline incision was made over the swelling carried proximally distally.  Dissection was carried down the knee joint and a medial parapatellar arthrotomy is  made finding a moderate joint effusion.  With the knee in a flexed position we found significant wear of cartilage throughout the knee.  Osteophytes were removed from all 3 compartments as well as the medial and lateral meniscus and ACL.  Using an extramedullary  based cutting guide we made our proximal tibia cut correcting for varus and valgus and a 7 degree slope.  We made this cut to take 2 mm off the low side and we backed it down 4 mm.  The staple that was in the medial tibial plateau was not visible at that standpoint.  We then went to the femur and used an intramedullary based cutting guide for distal femur cut setting this her right knee and 5 degrees actually rotated for 10 mm distal femoral cut.  We then brought the knee back down to full extension and we had achieved full extension with a 10 mm extension block.  We then moved back to the femur and put a femoral sizing guide based off the epicondylar axis.  Based off of this we chose a size 9 femur.  We put a 4-in-1 cutting block for a size 9 femur and made our anterior and posterior cuts followed by her chamfer cuts.  We then backed the tibia and chose a size G tibial tray for right knee send and rotation off the tibial tubercle and the femur.  We did our drill hole and keel punch off of this and then removed a slight mall medial osteophyte and the retained staple was visualized.  We tried to remove the retained staple but it was so hard to remove it today because a small crack in the medial tibial plateau.  We placed a reduction forcep to hold this in place while we proceeded with the remainder of the case.  We trialed our size G right tibia combined with our size 9 right CR standard femur.  We trialed up to a 12 mm thickness right medial congruent polyethylene insert we are pleased with range of motion and stability without insert.  We then made a patella cut and drilled 3 holes for a size 35 patella button.  We then removed all trial instrumentation from the knee and irrigate the knee with normal saline solution.  We were able to place Marcaine  with epinephrine  around the arthrotomy.  Next with the knee in a flexed position we were able to place our Biomet/Zimmer persona tibial tray size G for a right knee and  we extended the stem for this using a stubby stem to bypass any fracture of the medial tibial plateau.  We then cemented our size 9 CR standard femur for right knee and placed our 12 mm thickness polyethylene insert.  We also cemented our size 35 patella button.  We then held the knee fully extended and compressed to allow the cement to harden.  Once that it hardened we assessed the knee and it was stable.  The fracture line was not visible but we still decided to place a 6 hole mini frag straight plate contoured around the medial tibial plateau with locking screws to buttress the medial plateau.  We then let the tourniquet down and hemostasis was obtained with electrocautery.  The arthrotomy was closed with interrupted #1 Vicryl suture followed by 0 Vicryl close deep tissue and 2-0 Vicryl to close subcutaneous tissue.  The skin was closed with staples.  Well-padded sterile dressing was applied.  The patient was taken recovery room in stable condition.  Gil  Clark, PA-C did assist during the entire case from beginning to end and his assistance was crucial and medically necessary for soft tissue management and retraction, helping guide implant placement and a layered closure of the wound.

## 2023-07-09 NOTE — Anesthesia Procedure Notes (Signed)
 Procedure Name: MAC Date/Time: 07/09/2023 12:00 PM  Performed by: Darlena Ego, CRNAPre-anesthesia Checklist: Patient identified, Emergency Drugs available, Patient being monitored and Timeout performed Patient Re-evaluated:Patient Re-evaluated prior to induction Oxygen Delivery Method: Circle system utilized and Simple face mask Airway Equipment and Method: Oral airway

## 2023-07-09 NOTE — Anesthesia Postprocedure Evaluation (Signed)
 Anesthesia Post Note  Patient: [name]  Procedure(s) Performed: ARTHROPLASTY, KNEE, TOTAL (Right: Knee)     Patient location during evaluation: PACU Anesthesia Type: Spinal Level of consciousness: awake and alert, patient cooperative and oriented Pain management: pain level controlled Vital Signs Assessment: post-procedure vital signs reviewed and stable Respiratory status: nonlabored ventilation, spontaneous breathing and respiratory function stable Cardiovascular status: blood pressure returned to baseline and stable Postop Assessment: no apparent nausea or vomiting and spinal receding Anesthetic complications: no   No notable events documented.  Last Vitals:  Vitals:   07/09/23 1105 07/09/23 1400  BP: 136/72 (!) 89/71  Pulse: (!) 40 66  Resp:  13  Temp:  36.4 C  SpO2: 95% 93%    Last Pain:  Vitals:   07/09/23 1400  TempSrc:   PainSc: 0-No pain                 Savino Whisenant,E. Destany Severns

## 2023-07-09 NOTE — Evaluation (Signed)
 Physical Therapy Evaluation Patient Details Name: [name] MRN: [id number] DOB: 12-08-1950 Today's Date: 07/09/2023  History of Present Illness  73 yo male presents to therapy s/p R TKA on 07/09/2023 due to failure of conservative measures. Pt PMH includes but is not limited to: B LE edema, L TKA (2018) and revision (2024), polycythemia, OSA on CPAP,  PAF, HLD, HTN, anxiety, and hyperthyroidism.  Clinical Impression    [name] is a 73 y.o. male POD 0 s/p R TKA. Patient reports IND with mobility at baseline. Patient is now limited by functional impairments (see PT problem list below) and requires mod A for bed mobility and min A for transfers. Patient was able to ambulate 50 feet with RW and CGA level of assist. Patient instructed in exercise to facilitate ROM and circulation to manage edema. Patient will benefit from continued skilled PT interventions to address impairments and progress towards PLOF. Acute PT will follow to progress mobility and stair training in preparation for safe discharge home with family support and [facility] services.       If plan is discharge home, recommend the following: A little help with walking and/or transfers;A little help with bathing/dressing/bathroom;Assistance with cooking/housework;Assist for transportation;Help with stairs or ramp for entrance   Can travel by private vehicle        Equipment Recommendations None recommended by PT  Recommendations for Other Services       Functional Status Assessment Patient has had a recent decline in their functional status and demonstrates the ability to make significant improvements in function in a reasonable and predictable amount of time.     Precautions / Restrictions Precautions Precautions: Fall;Knee Restrictions Weight Bearing Restrictions Per Provider Order: No      Mobility  Bed Mobility Overal bed mobility: Needs Assistance Bed Mobility: Supine to Sit     Supine to sit: Mod assist,  HOB elevated, Used rails     General bed mobility comments: mod A for R LE to floor    Transfers Overall transfer level: Needs assistance Equipment used: Rollator (4 wheels) Transfers: Sit to/from Stand Sit to Stand: Min assist           General transfer comment: cues and pull to stand    Ambulation/Gait Ambulation/Gait assistance: Contact guard assist Gait Distance (Feet): 50 Feet Assistive device: Rolling walker (2 wheels) Gait Pattern/deviations: Step-to pattern, Antalgic, Trunk flexed, Decreased stance time - right Gait velocity: decreased     General Gait Details: min cues and heavy reliance on B UE support at RW to offload R LE in stance phase  Stairs            Wheelchair Mobility     Tilt Bed    Modified Rankin (Stroke Patients Only)       Balance Overall balance assessment: Needs assistance Sitting-balance support: Feet supported Sitting balance-Leahy Scale: Good     Standing balance support: Bilateral upper extremity supported, During functional activity, Reliant on assistive device for balance Standing balance-Leahy Scale: Poor                               Pertinent Vitals/Pain Pain Assessment Pain Assessment: 0-10 Pain Score: 6  Pain Location: R knee and LE Pain Descriptors / Indicators: Aching, Constant, Discomfort, Grimacing, Operative site guarding Pain Intervention(s): Limited activity within patient's tolerance, Monitored during session, Premedicated before session, Repositioned, Ice applied    Home Living Family/patient expects to be  discharged to:: Private residence Living Arrangements: Spouse/significant other Available Help at Discharge: Family Type of Home: House Home Access: Stairs to enter Entrance Stairs-Rails: Right;Left;Can reach both Entrance Stairs-Number of Steps: 4-5 Alternate Level Stairs-Number of Steps: has chair lift or shaft elevator Home Layout: Two level Home Equipment: Agricultural consultant (2  wheels);Cane - single point Additional Comments: iceman machine    Prior Function Prior Level of Function : Independent/Modified Independent             Mobility Comments: IND no AD for all ADLs, self care tasks and IADLs       Extremity/Trunk Assessment        Lower Extremity Assessment Lower Extremity Assessment: RLE deficits/detail RLE Deficits / Details: ankle DF/PF 5/5; SLR AA RLE Sensation: WNL    Cervical / Trunk Assessment Cervical / Trunk Assessment: Normal  Communication   Communication Communication: Impaired Factors Affecting Communication: Hearing impaired (B hearing aids)    Cognition Arousal: Alert Behavior During Therapy: WFL for tasks assessed/performed   PT - Cognitive impairments: No apparent impairments                         Following commands: Intact       Cueing       General Comments      Exercises Total Joint Exercises Ankle Circles/Pumps: AROM, Both, 10 reps   Assessment/Plan    PT Assessment Patient needs continued PT services  PT Problem List Decreased strength;Decreased range of motion;Decreased activity tolerance;Decreased balance;Decreased mobility;Decreased coordination;Pain       PT Treatment Interventions DME instruction;Gait training;Stair training;Functional mobility training;Therapeutic activities;Therapeutic exercise;Balance training;Neuromuscular re-education;Patient/family education;Modalities    PT Goals (Current goals can be found in the Care Plan section)  Acute Rehab PT Goals 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 PT Goal Formulation: With patient Time For Goal Achievement: 07/23/23 Potential to Achieve Goals: Good    Frequency 7X/week     Co-evaluation               AM-PAC PT "6 Clicks" Mobility  Outcome Measure Help needed turning from your back to your side while in a flat bed without using bedrails?: A Little Help needed moving from lying on your back to sitting  on the side of a flat bed without using bedrails?: A Little Help needed moving to and from a bed to a chair (including a wheelchair)?: A Little Help needed standing up from a chair using your arms (e.g., wheelchair or bedside chair)?: A Little Help needed to walk in hospital room?: A Little Help needed climbing 3-5 steps with a railing? : A Lot 6 Click Score: 17    End of Session Equipment Utilized During Treatment: Gait belt Activity Tolerance: Other (comment);Patient tolerated treatment well;No increased pain (fatigue) Patient left: in chair;with call bell/phone within reach;with chair alarm set Nurse Communication: Mobility status PT Visit Diagnosis: Unsteadiness on feet (R26.81);Other abnormalities of gait and mobility (R26.89);Muscle weakness (generalized) (M62.81);Difficulty in walking, not elsewhere classified (R26.2);Pain Pain - Right/Left: Right Pain - part of body: Knee;Leg    Time: 3474-2595 PT Time Calculation (min) (ACUTE ONLY): 23 min   Charges:   PT Evaluation $PT Eval Low Complexity: 1 Low PT Treatments $Gait Training: 8-22 mins PT General Charges $$ ACUTE PT VISIT: 1 Visit         Cary Clarks, PT Acute Rehab   Annalee Kiang 07/09/2023, 6:51 PM

## 2023-07-09 NOTE — Anesthesia Procedure Notes (Signed)
 Spinal  Patient location during procedure: OR End time: 07/09/2023 11:45 AM Reason for block: surgical anesthesia Staffing Performed: anesthesiologist  Anesthesiologist: Jonne Netters, MD Performed by: Jonne Netters, MD Authorized by: Jonne Netters, MD   Preanesthetic Checklist Completed: patient identified, IV checked, site marked, risks and benefits discussed, surgical consent, monitors and equipment checked, pre-op evaluation and timeout performed Spinal Block Patient position: sitting Prep: DuraPrep Patient monitoring: heart rate, cardiac monitor, continuous pulse ox and blood pressure Approach: midline Location: L3-4 Injection technique: single-shot Needle Needle type: Pencan and Introducer  Needle gauge: 24 G Needle length: 9 cm Assessment Sensory level: T4 Events: CSF return Additional Notes Pt identified in Operating room.  Monitors applied. Working IV access confirmed. Sterile prep, drape lumbar spine.  1% lido local L 3,4.  #24ga Pencan into clear CSF L 3,4.  12mg  0.75% Bupivacaine  with dextrose  injected with asp CSF beginning and end of injection.  Patient asymptomatic, VSS, no heme aspirated, tolerated well.  Fay Hoop, MD

## 2023-07-10 DIAGNOSIS — M1711 Unilateral primary osteoarthritis, right knee: Secondary | ICD-10-CM | POA: Diagnosis not present

## 2023-07-10 LAB — BASIC METABOLIC PANEL WITH GFR
Anion gap: 9 (ref 5–15)
BUN: 16 mg/dL (ref 8–23)
CO2: 25 mmol/L (ref 22–32)
Calcium: 8.9 mg/dL (ref 8.9–10.3)
Chloride: 102 mmol/L (ref 98–111)
Creatinine, Ser: 0.88 mg/dL (ref 0.61–1.24)
GFR, Estimated: >60 mL/min (ref >60)
Glucose, Bld: 146 mg/dL — ABNORMAL HIGH (ref 70–99)
Potassium: 4.3 mmol/L (ref 3.5–5.1)
Sodium: 136 mmol/L (ref 135–145)

## 2023-07-10 LAB — CBC
HCT: 40.1 % (ref 39.0–52.0)
Hemoglobin: 12.8 g/dL — ABNORMAL LOW (ref 13.0–17.0)
MCH: 29.6 pg (ref 26.0–34.0)
MCHC: 31.9 g/dL (ref 30.0–36.0)
MCV: 92.8 fL (ref 80.0–100.0)
Platelets: 234 10*3/uL (ref 150–400)
RBC: 4.32 MIL/uL (ref 4.22–5.81)
RDW: 13.7 % (ref 11.5–15.5)
WBC: 17.1 10*3/uL — ABNORMAL HIGH (ref 4.0–10.5)
nRBC: 0 % (ref 0.0–0.2)

## 2023-07-10 MED ORDER — OXYCODONE HCL 5 MG PO TABS: 5.0000 mg | ORAL_TABLET | ORAL | 0 refills | Status: DC | PRN

## 2023-07-10 MED ORDER — METHOCARBAMOL 500 MG PO TABS: 500.0000 mg | ORAL_TABLET | Freq: Four times a day (QID) | ORAL | 1 refills | Status: AC | PRN

## 2023-07-10 NOTE — Progress Notes (Signed)
 Physical Therapy Treatment Patient Details Name: [name] MRN: [id number] DOB: [birth year] Today's Date: 07/10/2023   History of Present Illness 73 yo male presents to therapy s/p R TKA on 07/09/2023 due to failure of conservative measures. Pt PMH includes but is not limited to: B LE edema, L TKA (2018) and revision (2024), polycythemia, OSA on CPAP,  PAF, HLD, HTN, anxiety, and hyperthyroidism.    PT Comments  Pt agreeable to working with therapy. Moderate pain with activity. Reviewed/practiced exercises, gait training, and stair training. Vitals WNL after activity. PT education completed. No further questions/concerns from pt/family. Pt feels ready to d/c home on today.     If plan is discharge home, recommend the following: A little help with walking and/or transfers;A little help with bathing/dressing/bathroom;Assistance with cooking/housework;Assist for transportation;Help with stairs or ramp for entrance   Can travel by private vehicle        Equipment Recommendations  None recommended by PT    Recommendations for Other Services       Precautions / Restrictions Precautions Precautions: Fall;Knee Restrictions Weight Bearing Restrictions Per Provider Order: No     Mobility  Bed Mobility Overal bed mobility: Needs Assistance Bed Mobility: Supine to Sit     Supine to sit: Contact guard, HOB elevated     General bed mobility comments: Cues for technique. Pt used gait belt as leg lifter. Increased time.    Transfers Overall transfer level: Needs assistance Equipment used: Rolling walker (2 wheels) Transfers: Sit to/from Stand Sit to Stand: Contact guard assist, From elevated surface           General transfer comment: Cues for safety, technique, hand placement. Pt prefers to pull up on walker.    Ambulation/Gait Ambulation/Gait assistance: Contact guard assist Gait Distance (Feet): 70 Feet Assistive device: Rolling walker (2 wheels) Gait  Pattern/deviations: Antalgic, Step-through pattern, Decreased stride length       General Gait Details: Slow but steady gait. Cues for safety. Dyspnea 2/4.   Stairs Stairs: Yes Stairs assistance: Contact guard assist Stair Management: Step to pattern, Forwards, Two rails Number of Stairs: 2 General stair comments: Up and over portable stairs x 1. Cues for safety, technique, sequencing.   Wheelchair Mobility     Tilt Bed    Modified Rankin (Stroke Patients Only)       Balance Overall balance assessment: Needs assistance         Standing balance support: Bilateral upper extremity supported, During functional activity, Reliant on assistive device for balance Standing balance-Leahy Scale: Poor                              Communication Communication Communication: Impaired Factors Affecting Communication: Hearing impaired  Cognition Arousal: Alert Behavior During Therapy: WFL for tasks assessed/performed   PT - Cognitive impairments: No apparent impairments                         Following commands: Intact      Cueing Cueing Techniques: Verbal cues  Exercises Total Joint Exercises Ankle Circles/Pumps: AROM, Both, 10 reps Quad Sets: AROM, Right, 10 reps Heel Slides: AAROM, Right, 10 reps Hip ABduction/ADduction: AAROM, Right, 10 reps Straight Leg Raises: AAROM, Right, 10 reps Goniometric ROM: ~10-55 degrees    General Comments        Pertinent Vitals/Pain Pain Assessment Pain Assessment: 0-10 Pain Score: 5  Pain Location: R LE Pain Descriptors /  Indicators: Aching, Grimacing, Operative site guarding Pain Intervention(s): Limited activity within patient's tolerance, Monitored during session, Ice applied, Repositioned    Home Living                          Prior Function            PT Goals (current goals can now be found in the care plan section) Progress towards PT goals: Progressing toward goals     Frequency    7X/week      PT Plan      Co-evaluation              AM-PAC PT "6 Clicks" Mobility   Outcome Measure  Help needed turning from your back to your side while in a flat bed without using bedrails?: A Little Help needed moving from lying on your back to sitting on the side of a flat bed without using bedrails?: A Little Help needed moving to and from a bed to a chair (including a wheelchair)?: A Little Help needed standing up from a chair using your arms (e.g., wheelchair or bedside chair)?: A Little Help needed to walk in hospital room?: A Little Help needed climbing 3-5 steps with a railing? : A Little 6 Click Score: 18    End of Session Equipment Utilized During Treatment: Gait belt Activity Tolerance: Patient tolerated treatment well;Patient limited by fatigue;Patient limited by pain Patient left: in chair;with call bell/phone within reach;with family/visitor present   PT Visit Diagnosis: Difficulty in walking, not elsewhere classified (R26.2);Pain;Other abnormalities of gait and mobility (R26.89) Pain - Right/Left: Right Pain - part of body: Knee     Time: 1000-1038 PT Time Calculation (min) (ACUTE ONLY): 38 min  Charges:    $Gait Training: 23-37 mins $Therapeutic Exercise: 8-22 mins PT General Charges $$ ACUTE PT VISIT: 1 Visit                         Tanda Falter, PT Acute Rehabilitation  Office: [phone]

## 2023-07-10 NOTE — Plan of Care (Signed)

## 2023-07-10 NOTE — Discharge Instructions (Signed)

## 2023-07-10 NOTE — Progress Notes (Signed)
 Subjective: 1 Day Post-Op Procedure(s) (LRB): ARTHROPLASTY, KNEE, TOTAL (Right) Patient reports pain as moderate.  Voiding and eating. Wants to discharge to home later today.   Objective: Vital signs in last 24 hours: Temp:  [97.5 F (36.4 C)-98.5 F (36.9 C)] 98 F (36.7 C) (05/31 0531) Pulse Rate:  [40-75] 69 (05/31 0531) Resp:  [9-18] 17 (05/31 0531) BP: (89-150)/(63-82) 127/70 (05/31 0531) SpO2:  [89 %-99 %] 95 % (05/31 0531)  Intake/Output from previous day: 05/30 0701 - 05/31 0700 In: 3171.2 [P.O.:600; I.V.:2371.2; IV Piggyback:200] Out: 3025 [Urine:2925; Blood:100] Intake/Output this shift: Total I/O In: 240 [P.O.:240] Out: 200 [Urine:200]  Recent Labs    07/10/23 0406  HGB 12.8*   Recent Labs    07/10/23 0406  WBC 17.1*  RBC 4.32  HCT 40.1  PLT 234   Recent Labs    07/10/23 0406  NA 136  K 4.3  CL 102  CO2 25  BUN 16  CREATININE 0.88  GLUCOSE 146*  CALCIUM  8.9   No results for input(s): "LABPT", "INR" in the last 72 hours.  Dorsiflexion/Plantar flexion intact Incision: dressing C/D/I Compartment soft   Assessment/Plan: 1 Day Post-Op Procedure(s) (LRB): ARTHROPLASTY, KNEE, TOTAL (Right) Up with therapy Discharge to home if remains stable and does well with PT.  On Eliquis  for A-Fib.       Meldon Hanzlik 07/10/2023, 9:52 AM
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
## 2023-07-10 NOTE — Progress Notes (Signed)
   07/10/23 1048  TOC Brief Assessment  Insurance and Status Reviewed  Patient has primary care physician Yes  Home environment has been reviewed home w/ spouse  Prior level of function: mod independent  Prior/Current Home Services No current home services  Social Drivers of Health Review SDOH reviewed no interventions necessary  Readmission risk has been reviewed Yes  Transition of care needs no transition of care needs at this time

## 2023-07-13 ENCOUNTER — Telehealth: Payer: Self-pay | Admitting: Orthopaedic Surgery

## 2023-07-13 ENCOUNTER — Encounter (HOSPITAL_COMMUNITY): Payer: Self-pay | Admitting: Orthopaedic Surgery

## 2023-07-13 NOTE — Telephone Encounter (Signed)
 Please advise. Patient had surgery on 07/09/23.

## 2023-07-13 NOTE — Telephone Encounter (Signed)
 Pt is requesting a phone call to go over concerns regarding test results.

## 2023-07-13 NOTE — Telephone Encounter (Signed)
 LVM TO CALL BACK

## 2023-07-16 ENCOUNTER — Other Ambulatory Visit: Payer: Self-pay | Admitting: Surgical

## 2023-07-16 ENCOUNTER — Telehealth: Payer: Self-pay | Admitting: Orthopaedic Surgery

## 2023-07-16 MED ORDER — OXYCODONE HCL 5 MG PO TABS: 5.0000 mg | ORAL_TABLET | ORAL | 0 refills | Status: AC | PRN

## 2023-07-16 MED ORDER — OXYCODONE HCL 5 MG PO TABS: 5.0000 mg | ORAL_TABLET | ORAL | 0 refills | Status: DC | PRN

## 2023-07-16 NOTE — Telephone Encounter (Signed)
 Patient  called and needs a refill on Oxycodone  into the [facility]. CB#[phone]

## 2023-07-16 NOTE — Addendum Note (Signed)
 Addended by: Zakariyya Helfman R on: 07/16/2023 11:54 AM   Modules accepted: Orders

## 2023-07-22 ENCOUNTER — Encounter: Payer: Self-pay | Admitting: Orthopaedic Surgery

## 2023-07-22 ENCOUNTER — Ambulatory Visit (INDEPENDENT_AMBULATORY_CARE_PROVIDER_SITE_OTHER): Admitting: Orthopaedic Surgery

## 2023-07-22 ENCOUNTER — Other Ambulatory Visit (INDEPENDENT_AMBULATORY_CARE_PROVIDER_SITE_OTHER): Payer: Self-pay

## 2023-07-22 VITALS — BP 93/64

## 2023-07-22 DIAGNOSIS — Z96651 Presence of right artificial knee joint: Secondary | ICD-10-CM

## 2023-07-22 MED ORDER — GABAPENTIN 300 MG PO CAPS: 300.0000 mg | ORAL_CAPSULE | Freq: Every day | ORAL | 1 refills | Status: AC

## 2023-07-22 NOTE — Progress Notes (Signed)
 The patient is a 73 year old gentleman who is here 2 weeks status post a right total knee replacement.  We have replaced his left knee in the recent past.  He had physical therapy upstairs after his first surgery so we will work on getting him set up for outpatient physical therapy upstairs here.  He is having a lot of fatigue.  His blood pressure today is 95/63.  We have told him to stop his blood pressure medication over the next week.  He says he has no energy and not sleeping well.  He does not want stronger narcotics either.  Examination of his right knee shows that the incision looks good.  Staples are removed and Steri-Strips applied.  His extension is almost full and his flexion is to 90 degrees.  We did obtain x-rays today of his right knee with 2 views and it shows a well-seated total knee arthroplasty with no complicating features.  The medial hardware is intact femoral there was a fracture line at his medial tibial plateau from trying to remove previous hardware.  We will set him up for outpatient physical therapy upstairs.  Will try 300 mg of Neurontin  at bedtime to see if this will help.  All questions concerns were answered addressed.  Will see him back in a month to see how he is doing overall but no x-rays are needed unless there is significant issues.

## 2023-07-23 ENCOUNTER — Other Ambulatory Visit: Payer: Self-pay

## 2023-07-23 DIAGNOSIS — Z96651 Presence of right artificial knee joint: Secondary | ICD-10-CM

## 2023-08-06 ENCOUNTER — Encounter: Payer: Self-pay | Admitting: Orthopaedic Surgery

## 2023-08-09 ENCOUNTER — Ambulatory Visit: Admitting: Physical Therapy

## 2023-08-11 ENCOUNTER — Ambulatory Visit: Admitting: Rehabilitative and Restorative Service Providers"

## 2023-08-11 ENCOUNTER — Encounter: Payer: Self-pay | Admitting: Rehabilitative and Restorative Service Providers"

## 2023-08-11 DIAGNOSIS — M25661 Stiffness of right knee, not elsewhere classified: Secondary | ICD-10-CM

## 2023-08-11 DIAGNOSIS — M25561 Pain in right knee: Secondary | ICD-10-CM | POA: Diagnosis not present

## 2023-08-11 DIAGNOSIS — R600 Localized edema: Secondary | ICD-10-CM

## 2023-08-11 DIAGNOSIS — M6281 Muscle weakness (generalized): Secondary | ICD-10-CM | POA: Diagnosis not present

## 2023-08-11 DIAGNOSIS — R262 Difficulty in walking, not elsewhere classified: Secondary | ICD-10-CM | POA: Diagnosis not present

## 2023-08-11 DIAGNOSIS — G8929 Other chronic pain: Secondary | ICD-10-CM
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
## 2023-08-12 ENCOUNTER — Ambulatory Visit: Admitting: Physical Therapy

## 2023-08-12 ENCOUNTER — Encounter: Payer: Self-pay | Admitting: Physical Therapy

## 2023-08-12 DIAGNOSIS — R262 Difficulty in walking, not elsewhere classified: Secondary | ICD-10-CM

## 2023-08-12 DIAGNOSIS — M25661 Stiffness of right knee, not elsewhere classified: Secondary | ICD-10-CM

## 2023-08-12 DIAGNOSIS — G8929 Other chronic pain: Secondary | ICD-10-CM

## 2023-08-12 DIAGNOSIS — M25561 Pain in right knee: Secondary | ICD-10-CM

## 2023-08-12 DIAGNOSIS — R600 Localized edema: Secondary | ICD-10-CM

## 2023-08-12 DIAGNOSIS — M6281 Muscle weakness (generalized): Secondary | ICD-10-CM | POA: Diagnosis not present

## 2023-08-12 DIAGNOSIS — M25562 Pain in left knee: Secondary | ICD-10-CM

## 2023-08-12 DIAGNOSIS — M25662 Stiffness of left knee, not elsewhere classified: Secondary | ICD-10-CM
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
## 2023-08-16 ENCOUNTER — Telehealth: Payer: Self-pay | Admitting: Rehabilitative and Restorative Service Providers"

## 2023-08-16 ENCOUNTER — Encounter: Admitting: Rehabilitative and Restorative Service Providers"

## 2023-08-16 NOTE — Telephone Encounter (Signed)
 Left message about appointment opening today 08/16/2023.   Ozell Silvan, PT, DPT, OCS, ATC 08/16/23  8:33 AM

## 2023-08-19 ENCOUNTER — Ambulatory Visit: Admitting: Orthopaedic Surgery

## 2023-08-19 ENCOUNTER — Encounter: Payer: Self-pay | Admitting: Orthopaedic Surgery

## 2023-08-19 DIAGNOSIS — Z96651 Presence of right artificial knee joint: Secondary | ICD-10-CM

## 2023-08-19 NOTE — Progress Notes (Signed)
 The patient is now about 6 weeks status post a right total knee arthroplasty.  We replaced his left knee in the fall last year.  He is an active 73 year old gentleman and is doing very well.  Physical therapy upstairs has done a great job with him.  On exam both knees have excellent range of motion.  Obviously the right knee is more swollen given his more recent surgery.  Both knees feel stable.  He will continue increase his activities as he tolerates.  He can drop from my standpoint.  The next time I need to see him is not for 3 months.  At that visit we will have a standing AP and lateral both knees.

## 2023-08-20 ENCOUNTER — Ambulatory Visit: Admitting: Rehabilitative and Restorative Service Providers"

## 2023-08-20 ENCOUNTER — Encounter: Payer: Self-pay | Admitting: Rehabilitative and Restorative Service Providers"

## 2023-08-20 DIAGNOSIS — M6281 Muscle weakness (generalized): Secondary | ICD-10-CM

## 2023-08-20 DIAGNOSIS — M25562 Pain in left knee: Secondary | ICD-10-CM

## 2023-08-20 DIAGNOSIS — M25561 Pain in right knee: Secondary | ICD-10-CM | POA: Diagnosis not present

## 2023-08-20 DIAGNOSIS — M25661 Stiffness of right knee, not elsewhere classified: Secondary | ICD-10-CM | POA: Diagnosis not present

## 2023-08-20 DIAGNOSIS — R262 Difficulty in walking, not elsewhere classified: Secondary | ICD-10-CM

## 2023-08-20 DIAGNOSIS — G8929 Other chronic pain: Secondary | ICD-10-CM

## 2023-08-20 DIAGNOSIS — M25662 Stiffness of left knee, not elsewhere classified: Secondary | ICD-10-CM

## 2023-08-20 DIAGNOSIS — R600 Localized edema: Secondary | ICD-10-CM
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
## 2023-08-26 ENCOUNTER — Ambulatory Visit: Admitting: Physical Therapy

## 2023-08-26 ENCOUNTER — Encounter: Payer: Self-pay | Admitting: Physical Therapy

## 2023-08-26 DIAGNOSIS — M25661 Stiffness of right knee, not elsewhere classified: Secondary | ICD-10-CM | POA: Diagnosis not present

## 2023-08-26 DIAGNOSIS — R600 Localized edema: Secondary | ICD-10-CM

## 2023-08-26 DIAGNOSIS — G8929 Other chronic pain: Secondary | ICD-10-CM

## 2023-08-26 DIAGNOSIS — M6281 Muscle weakness (generalized): Secondary | ICD-10-CM

## 2023-08-26 DIAGNOSIS — R262 Difficulty in walking, not elsewhere classified: Secondary | ICD-10-CM

## 2023-08-26 DIAGNOSIS — M25561 Pain in right knee: Secondary | ICD-10-CM
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
## 2023-08-27 ENCOUNTER — Encounter: Payer: Self-pay | Admitting: Physical Therapy

## 2023-08-27 ENCOUNTER — Ambulatory Visit: Admitting: Physical Therapy

## 2023-08-27 ENCOUNTER — Encounter: Admitting: Physical Therapy

## 2023-08-27 DIAGNOSIS — M25561 Pain in right knee: Secondary | ICD-10-CM | POA: Diagnosis not present

## 2023-08-27 DIAGNOSIS — M25661 Stiffness of right knee, not elsewhere classified: Secondary | ICD-10-CM | POA: Diagnosis not present

## 2023-08-27 DIAGNOSIS — R262 Difficulty in walking, not elsewhere classified: Secondary | ICD-10-CM | POA: Diagnosis not present

## 2023-08-27 DIAGNOSIS — R600 Localized edema: Secondary | ICD-10-CM

## 2023-08-27 DIAGNOSIS — G8929 Other chronic pain: Secondary | ICD-10-CM

## 2023-08-27 DIAGNOSIS — M6281 Muscle weakness (generalized): Secondary | ICD-10-CM | POA: Diagnosis not present
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
## 2023-08-30 ENCOUNTER — Other Ambulatory Visit: Payer: Self-pay

## 2023-08-30 MED ORDER — FLECAINIDE ACETATE 100 MG PO TABS: 100.0000 mg | ORAL_TABLET | Freq: Two times a day (BID) | ORAL | 2 refills | Status: DC

## 2023-08-31 ENCOUNTER — Encounter: Admitting: Rehabilitative and Restorative Service Providers"
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
## 2023-09-02 ENCOUNTER — Encounter: Payer: Self-pay | Admitting: Rehabilitative and Restorative Service Providers"

## 2023-09-02 ENCOUNTER — Ambulatory Visit: Admitting: Rehabilitative and Restorative Service Providers"

## 2023-09-02 DIAGNOSIS — R600 Localized edema: Secondary | ICD-10-CM

## 2023-09-02 DIAGNOSIS — R262 Difficulty in walking, not elsewhere classified: Secondary | ICD-10-CM

## 2023-09-02 DIAGNOSIS — M25661 Stiffness of right knee, not elsewhere classified: Secondary | ICD-10-CM

## 2023-09-02 DIAGNOSIS — G8929 Other chronic pain: Secondary | ICD-10-CM

## 2023-09-02 DIAGNOSIS — M25561 Pain in right knee: Secondary | ICD-10-CM

## 2023-09-02 DIAGNOSIS — M6281 Muscle weakness (generalized): Secondary | ICD-10-CM | POA: Diagnosis not present

## 2023-09-07 ENCOUNTER — Encounter: Payer: Self-pay | Admitting: Physical Therapy

## 2023-09-07 ENCOUNTER — Ambulatory Visit: Admitting: Physical Therapy

## 2023-09-07 DIAGNOSIS — G8929 Other chronic pain: Secondary | ICD-10-CM

## 2023-09-07 DIAGNOSIS — M25662 Stiffness of left knee, not elsewhere classified: Secondary | ICD-10-CM

## 2023-09-07 DIAGNOSIS — M25661 Stiffness of right knee, not elsewhere classified: Secondary | ICD-10-CM

## 2023-09-07 DIAGNOSIS — M25562 Pain in left knee: Secondary | ICD-10-CM

## 2023-09-07 DIAGNOSIS — M25561 Pain in right knee: Secondary | ICD-10-CM | POA: Diagnosis not present

## 2023-09-07 DIAGNOSIS — R600 Localized edema: Secondary | ICD-10-CM

## 2023-09-07 DIAGNOSIS — R262 Difficulty in walking, not elsewhere classified: Secondary | ICD-10-CM | POA: Diagnosis not present

## 2023-09-07 DIAGNOSIS — M6281 Muscle weakness (generalized): Secondary | ICD-10-CM
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
## 2023-09-09 ENCOUNTER — Encounter: Admitting: Rehabilitative and Restorative Service Providers"

## 2023-09-13 ENCOUNTER — Encounter: Admitting: Rehabilitative and Restorative Service Providers"

## 2023-09-15 ENCOUNTER — Encounter: Admitting: Rehabilitative and Restorative Service Providers"

## 2023-11-22 ENCOUNTER — Ambulatory Visit: Admitting: Orthopaedic Surgery

## 2023-11-29 ENCOUNTER — Other Ambulatory Visit (INDEPENDENT_AMBULATORY_CARE_PROVIDER_SITE_OTHER): Payer: Self-pay

## 2023-11-29 ENCOUNTER — Other Ambulatory Visit: Payer: Self-pay | Admitting: Cardiovascular Disease

## 2023-11-29 ENCOUNTER — Encounter: Payer: Self-pay | Admitting: Orthopaedic Surgery

## 2023-11-29 ENCOUNTER — Ambulatory Visit: Admitting: Orthopaedic Surgery

## 2023-11-29 DIAGNOSIS — Z96652 Presence of left artificial knee joint: Secondary | ICD-10-CM

## 2023-11-29 DIAGNOSIS — Z96651 Presence of right artificial knee joint: Secondary | ICD-10-CM

## 2023-11-29 NOTE — Progress Notes (Signed)
 The patient is a very active 73 year old gentleman who has a history of both his knees being replaced.  The left knee was replaced back in November 2024 which is getting close to a year ago.  That knee had a partial knee arthroplasty from the medial compartment was removed and converted to a total knee replacement.  In May of this year which was 5 months ago, we replaced his right knee.  He had previous ligament reconstruction in that knee.  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  He would like to know what his restrictions would be.  He is walking without assistive device.  Both knees today are examined and have excellent range of motion and are stable on exam.  Both knees are slightly warm but no significant swelling.  He still has a little bit of swelling in his foot and ankle on the right side but that has gotten better.  X-rays of both knees today show the implants are well-seated with no complicating features.  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  From our standpoint we will see him back in 6 months which will be the year standpoint from his more recent right knee replacement.  We will have 2 views of the right knee at that visit.

## 2023-12-01 MED ORDER — FLECAINIDE ACETATE 100 MG PO TABS: 100.0000 mg | ORAL_TABLET | Freq: Two times a day (BID) | ORAL | 1 refills | Status: AC

## 2023-12-13 ENCOUNTER — Encounter: Payer: Self-pay | Admitting: Radiology

## 2024-02-13 ENCOUNTER — Other Ambulatory Visit: Payer: Self-pay | Admitting: General Practice
